# Patient Record
Sex: Male | Born: 1945
Health system: Southern US, Community
[De-identification: ages and names within clinical notes are randomized; demographics above are authoritative.]

## PROBLEM LIST (undated history)

## (undated) DIAGNOSIS — R011 Cardiac murmur, unspecified: Secondary | ICD-10-CM

## (undated) DIAGNOSIS — K529 Noninfective gastroenteritis and colitis, unspecified: Secondary | ICD-10-CM

## (undated) DIAGNOSIS — K829 Disease of gallbladder, unspecified: Secondary | ICD-10-CM

## (undated) DIAGNOSIS — I2510 Atherosclerotic heart disease of native coronary artery without angina pectoris: Secondary | ICD-10-CM

## (undated) DIAGNOSIS — R972 Elevated prostate specific antigen [PSA]: Secondary | ICD-10-CM

## (undated) DIAGNOSIS — T7840XA Allergy, unspecified, initial encounter: Secondary | ICD-10-CM

## (undated) DIAGNOSIS — I10 Essential (primary) hypertension: Secondary | ICD-10-CM

## (undated) DIAGNOSIS — M1990 Unspecified osteoarthritis, unspecified site: Secondary | ICD-10-CM

## (undated) DIAGNOSIS — E785 Hyperlipidemia, unspecified: Secondary | ICD-10-CM

## (undated) DIAGNOSIS — K219 Gastro-esophageal reflux disease without esophagitis: Secondary | ICD-10-CM

## (undated) DIAGNOSIS — K589 Irritable bowel syndrome without diarrhea: Secondary | ICD-10-CM

## (undated) HISTORY — PX: TONSILLECTOMY AND ADENOIDECTOMY: SUR1326

## (undated) HISTORY — PX: COLONOSCOPY: SHX174

## (undated) HISTORY — PX: CORONARY STENT PLACEMENT: SHX1402

## (undated) HISTORY — DX: Hyperlipidemia, unspecified: E78.5

## (undated) HISTORY — DX: Unspecified osteoarthritis, unspecified site: M19.90

## (undated) HISTORY — DX: Atherosclerotic heart disease of native coronary artery without angina pectoris: I25.10

## (undated) HISTORY — DX: Elevated prostate specific antigen (PSA): R97.20

## (undated) HISTORY — DX: Disease of gallbladder, unspecified: K82.9

## (undated) HISTORY — DX: Allergy, unspecified, initial encounter: T78.40XA

## (undated) HISTORY — DX: Gastro-esophageal reflux disease without esophagitis: K21.9

## (undated) HISTORY — DX: Cardiac murmur, unspecified: R01.1

## (undated) HISTORY — DX: Irritable bowel syndrome, unspecified: K58.9

## (undated) HISTORY — PX: CARDIAC CATHETERIZATION: SHX172

## (undated) HISTORY — DX: Essential (primary) hypertension: I10

---

## 2000-07-21 ENCOUNTER — Emergency Department (HOSPITAL_COMMUNITY): Admission: EM | Admit: 2000-07-21 | Discharge: 2000-07-21 | Payer: Self-pay | Admitting: Emergency Medicine

## 2000-12-18 ENCOUNTER — Inpatient Hospital Stay (HOSPITAL_COMMUNITY): Admission: EM | Admit: 2000-12-18 | Discharge: 2000-12-21 | Payer: Self-pay | Admitting: Emergency Medicine

## 2000-12-18 ENCOUNTER — Encounter: Payer: Self-pay | Admitting: Emergency Medicine

## 2001-01-09 ENCOUNTER — Encounter (HOSPITAL_COMMUNITY): Admission: RE | Admit: 2001-01-09 | Discharge: 2001-04-09 | Payer: Self-pay | Admitting: Cardiology

## 2004-08-10 ENCOUNTER — Ambulatory Visit: Payer: Self-pay | Admitting: Pulmonary Disease

## 2004-08-19 ENCOUNTER — Ambulatory Visit: Payer: Self-pay | Admitting: Pulmonary Disease

## 2004-09-20 ENCOUNTER — Ambulatory Visit: Payer: Self-pay | Admitting: Cardiology

## 2004-11-25 ENCOUNTER — Ambulatory Visit: Payer: Self-pay | Admitting: Pulmonary Disease

## 2005-01-12 ENCOUNTER — Ambulatory Visit: Payer: Self-pay | Admitting: Pulmonary Disease

## 2005-05-04 ENCOUNTER — Ambulatory Visit: Payer: Self-pay | Admitting: Pulmonary Disease

## 2005-05-09 ENCOUNTER — Ambulatory Visit: Payer: Self-pay | Admitting: Pulmonary Disease

## 2005-08-17 ENCOUNTER — Ambulatory Visit: Payer: Self-pay | Admitting: Pulmonary Disease

## 2005-11-07 ENCOUNTER — Ambulatory Visit: Payer: Self-pay | Admitting: Pulmonary Disease

## 2005-11-10 ENCOUNTER — Ambulatory Visit: Payer: Self-pay | Admitting: Pulmonary Disease

## 2006-05-09 ENCOUNTER — Ambulatory Visit: Payer: Self-pay | Admitting: Pulmonary Disease

## 2006-05-30 ENCOUNTER — Ambulatory Visit: Payer: Self-pay | Admitting: Gastroenterology

## 2006-06-12 ENCOUNTER — Ambulatory Visit: Payer: Self-pay | Admitting: Gastroenterology

## 2006-10-16 ENCOUNTER — Ambulatory Visit: Payer: Self-pay | Admitting: Pulmonary Disease

## 2007-01-01 ENCOUNTER — Ambulatory Visit: Payer: Self-pay | Admitting: Pulmonary Disease

## 2007-01-04 ENCOUNTER — Ambulatory Visit: Payer: Self-pay | Admitting: Pulmonary Disease

## 2007-01-04 LAB — CONVERTED CEMR LAB
ALT: 27 units/L (ref 0–40)
AST: 24 units/L (ref 0–37)
Albumin: 3.5 g/dL (ref 3.5–5.2)
Alkaline Phosphatase: 80 units/L (ref 39–117)
BUN: 15 mg/dL (ref 6–23)
Basophils Absolute: 0 10*3/uL (ref 0.0–0.1)
Basophils Relative: 0.5 % (ref 0.0–1.0)
Bilirubin, Direct: 0.1 mg/dL (ref 0.0–0.3)
CO2: 29 meq/L (ref 19–32)
Calcium: 9.6 mg/dL (ref 8.4–10.5)
Chloride: 107 meq/L (ref 96–112)
Cholesterol: 140 mg/dL (ref 0–200)
Creatinine, Ser: 1 mg/dL (ref 0.4–1.5)
Eosinophils Absolute: 0.2 10*3/uL (ref 0.0–0.6)
Eosinophils Relative: 2.7 % (ref 0.0–5.0)
GFR calc Af Amer: 98 mL/min
GFR calc non Af Amer: 81 mL/min
Glucose, Bld: 98 mg/dL (ref 70–99)
HCT: 44.7 % (ref 39.0–52.0)
HDL: 36.6 mg/dL — ABNORMAL LOW (ref >39.0)
Hemoglobin: 15.1 g/dL (ref 13.0–17.0)
LDL Cholesterol: 80 mg/dL (ref 0–99)
Lymphocytes Relative: 33 % (ref 12.0–46.0)
MCHC: 33.9 g/dL (ref 30.0–36.0)
MCV: 90.4 fL (ref 78.0–100.0)
Monocytes Absolute: 0.7 10*3/uL (ref 0.2–0.7)
Monocytes Relative: 9.4 % (ref 3.0–11.0)
Neutro Abs: 3.8 10*3/uL (ref 1.4–7.7)
Neutrophils Relative %: 54.4 % (ref 43.0–77.0)
PSA: 0.84 ng/mL (ref 0.10–4.00)
Platelets: 240 10*3/uL (ref 150–400)
Potassium: 4.4 meq/L (ref 3.5–5.1)
RBC: 4.94 M/uL (ref 4.22–5.81)
RDW: 12.7 % (ref 11.5–14.6)
Sodium: 140 meq/L (ref 135–145)
TSH: 2.42 microintl units/mL (ref 0.35–5.50)
Total Bilirubin: 1.1 mg/dL (ref 0.3–1.2)
Total CHOL/HDL Ratio: 3.8
Total Protein: 6.5 g/dL (ref 6.0–8.3)
Triglycerides: 117 mg/dL (ref 0–149)
VLDL: 23 mg/dL (ref 0–40)
WBC: 7 10*3/uL (ref 4.5–10.5)

## 2007-08-13 ENCOUNTER — Encounter: Payer: Self-pay | Admitting: Pulmonary Disease

## 2007-08-13 DIAGNOSIS — I10 Essential (primary) hypertension: Secondary | ICD-10-CM | POA: Insufficient documentation

## 2007-08-13 DIAGNOSIS — E785 Hyperlipidemia, unspecified: Secondary | ICD-10-CM | POA: Insufficient documentation

## 2007-08-13 DIAGNOSIS — K219 Gastro-esophageal reflux disease without esophagitis: Secondary | ICD-10-CM | POA: Insufficient documentation

## 2007-10-22 ENCOUNTER — Ambulatory Visit: Payer: Self-pay | Admitting: Pulmonary Disease

## 2007-10-22 ENCOUNTER — Encounter: Payer: Self-pay | Admitting: Adult Health

## 2007-10-22 LAB — CONVERTED CEMR LAB
Bilirubin Urine: NEGATIVE
Crystals: NEGATIVE
Ketones, ur: NEGATIVE mg/dL
Mucus, UA: NEGATIVE
Nitrite: NEGATIVE
Specific Gravity, Urine: 1.025 (ref 1.000–1.03)
Total Protein, Urine: 100 mg/dL — AB
Urine Glucose: NEGATIVE mg/dL
Urobilinogen, UA: 0.2 (ref 0.0–1.0)
pH: 6.5 (ref 5.0–8.0)

## 2007-11-05 ENCOUNTER — Encounter: Payer: Self-pay | Admitting: Adult Health

## 2007-11-05 ENCOUNTER — Ambulatory Visit: Payer: Self-pay | Admitting: Pulmonary Disease

## 2007-11-07 LAB — CONVERTED CEMR LAB
ALT: 38 units/L (ref 0–53)
AST: 32 units/L (ref 0–37)
Albumin: 4 g/dL (ref 3.5–5.2)
Alkaline Phosphatase: 94 units/L (ref 39–117)
BUN: 15 mg/dL (ref 6–23)
Bacteria, UA: NEGATIVE
Basophils Absolute: 0 10*3/uL (ref 0.0–0.1)
Basophils Relative: 0.6 % (ref 0.0–1.0)
Bilirubin Urine: NEGATIVE
Bilirubin, Direct: 0.1 mg/dL (ref 0.0–0.3)
CO2: 32 meq/L (ref 19–32)
Calcium: 9.6 mg/dL (ref 8.4–10.5)
Chloride: 103 meq/L (ref 96–112)
Cholesterol: 139 mg/dL (ref 0–200)
Creatinine, Ser: 1 mg/dL (ref 0.4–1.5)
Crystals: NEGATIVE
Eosinophils Absolute: 0.1 10*3/uL (ref 0.0–0.7)
Eosinophils Relative: 1.7 % (ref 0.0–5.0)
GFR calc Af Amer: 98 mL/min
GFR calc non Af Amer: 81 mL/min
Glucose, Bld: 103 mg/dL — ABNORMAL HIGH (ref 70–99)
HCT: 46.2 % (ref 39.0–52.0)
HDL: 33.1 mg/dL — ABNORMAL LOW (ref >39.0)
Hemoglobin, Urine: NEGATIVE
Hemoglobin: 15.1 g/dL (ref 13.0–17.0)
Ketones, ur: NEGATIVE mg/dL
LDL Cholesterol: 71 mg/dL (ref 0–99)
Leukocytes, UA: NEGATIVE
Lymphocytes Relative: 29 % (ref 12.0–46.0)
MCHC: 32.7 g/dL (ref 30.0–36.0)
MCV: 90.5 fL (ref 78.0–100.0)
Monocytes Absolute: 0.6 10*3/uL (ref 0.1–1.0)
Monocytes Relative: 8.8 % (ref 3.0–12.0)
Mucus, UA: NEGATIVE
Neutro Abs: 3.9 10*3/uL (ref 1.4–7.7)
Neutrophils Relative %: 59.9 % (ref 43.0–77.0)
Nitrite: NEGATIVE
PSA: 5 ng/mL — ABNORMAL HIGH (ref 0.10–4.00)
Platelets: 216 10*3/uL (ref 150–400)
Potassium: 4.7 meq/L (ref 3.5–5.1)
RBC / HPF: NONE SEEN
RBC: 5.1 M/uL (ref 4.22–5.81)
RDW: 12.4 % (ref 11.5–14.6)
Sed Rate: 12 mm/hr (ref 0–16)
Sodium: 141 meq/L (ref 135–145)
Specific Gravity, Urine: 1.015 (ref 1.000–1.03)
TSH: 1.5 microintl units/mL (ref 0.35–5.50)
Total Bilirubin: 0.8 mg/dL (ref 0.3–1.2)
Total CHOL/HDL Ratio: 4.2
Total Protein: 7.5 g/dL (ref 6.0–8.3)
Triglycerides: 174 mg/dL — ABNORMAL HIGH (ref 0–149)
Urine Glucose: NEGATIVE mg/dL
Urobilinogen, UA: 0.2 (ref 0.0–1.0)
VLDL: 35 mg/dL (ref 0–40)
WBC: 6.5 10*3/uL (ref 4.5–10.5)
pH: 7 (ref 5.0–8.0)

## 2007-11-27 ENCOUNTER — Ambulatory Visit: Payer: Self-pay | Admitting: Pulmonary Disease

## 2007-11-27 DIAGNOSIS — H9190 Unspecified hearing loss, unspecified ear: Secondary | ICD-10-CM | POA: Insufficient documentation

## 2007-11-27 DIAGNOSIS — K589 Irritable bowel syndrome without diarrhea: Secondary | ICD-10-CM | POA: Insufficient documentation

## 2007-11-27 DIAGNOSIS — K829 Disease of gallbladder, unspecified: Secondary | ICD-10-CM | POA: Insufficient documentation

## 2007-11-27 LAB — CONVERTED CEMR LAB: PSA: 2.7 ng/mL (ref 0.10–4.00)

## 2007-11-30 ENCOUNTER — Telehealth (INDEPENDENT_AMBULATORY_CARE_PROVIDER_SITE_OTHER): Payer: Self-pay | Admitting: *Deleted

## 2008-04-09 ENCOUNTER — Encounter: Payer: Self-pay | Admitting: Pulmonary Disease

## 2008-05-06 ENCOUNTER — Ambulatory Visit: Payer: Self-pay | Admitting: Pulmonary Disease

## 2008-05-07 ENCOUNTER — Ambulatory Visit: Payer: Self-pay | Admitting: Pulmonary Disease

## 2008-05-07 LAB — CONVERTED CEMR LAB
ALT: 24 units/L (ref 0–53)
AST: 30 units/L (ref 0–37)
Albumin: 3.9 g/dL (ref 3.5–5.2)
Alkaline Phosphatase: 80 units/L (ref 39–117)
Bilirubin, Direct: 0.3 mg/dL (ref 0.0–0.3)
Cholesterol: 102 mg/dL (ref 0–200)
HDL: 39.7 mg/dL (ref >39.0)
LDL Cholesterol: 48 mg/dL (ref 0–99)
Total Bilirubin: 1.5 mg/dL — ABNORMAL HIGH (ref 0.3–1.2)
Total CHOL/HDL Ratio: 2.6
Total Protein: 6.9 g/dL (ref 6.0–8.3)
Triglycerides: 70 mg/dL (ref 0–149)
VLDL: 14 mg/dL (ref 0–40)

## 2009-04-09 ENCOUNTER — Ambulatory Visit: Payer: Self-pay | Admitting: Pulmonary Disease

## 2009-04-09 LAB — CONVERTED CEMR LAB
ALT: 34 units/L (ref 0–53)
AST: 27 units/L (ref 0–37)
Albumin: 4.1 g/dL (ref 3.5–5.2)
Alkaline Phosphatase: 90 units/L (ref 39–117)
BUN: 19 mg/dL (ref 6–23)
Basophils Absolute: 0.1 10*3/uL (ref 0.0–0.1)
Basophils Relative: 1.7 % (ref 0.0–3.0)
Bilirubin Urine: NEGATIVE
Bilirubin, Direct: 0.1 mg/dL (ref 0.0–0.3)
CO2: 31 meq/L (ref 19–32)
Calcium: 9.4 mg/dL (ref 8.4–10.5)
Chloride: 106 meq/L (ref 96–112)
Cholesterol: 143 mg/dL (ref 0–200)
Creatinine, Ser: 1 mg/dL (ref 0.4–1.5)
Eosinophils Absolute: 0.2 10*3/uL (ref 0.0–0.7)
Eosinophils Relative: 2.2 % (ref 0.0–5.0)
GFR calc non Af Amer: 80.61 mL/min (ref >60)
Glucose, Bld: 92 mg/dL (ref 70–99)
HCT: 44.8 % (ref 39.0–52.0)
HDL: 36.2 mg/dL — ABNORMAL LOW (ref >39.00)
Hemoglobin, Urine: NEGATIVE
Hemoglobin: 15.3 g/dL (ref 13.0–17.0)
Ketones, ur: NEGATIVE mg/dL
LDL Cholesterol: 84 mg/dL (ref 0–99)
Leukocytes, UA: NEGATIVE
Lymphocytes Relative: 32.7 % (ref 12.0–46.0)
Lymphs Abs: 2.5 10*3/uL (ref 0.7–4.0)
MCHC: 34.1 g/dL (ref 30.0–36.0)
MCV: 90.7 fL (ref 78.0–100.0)
Monocytes Absolute: 0.6 10*3/uL (ref 0.1–1.0)
Monocytes Relative: 8.3 % (ref 3.0–12.0)
Neutro Abs: 4.1 10*3/uL (ref 1.4–7.7)
Neutrophils Relative %: 55.1 % (ref 43.0–77.0)
Nitrite: NEGATIVE
PSA: 1.02 ng/mL (ref 0.10–4.00)
Platelets: 217 10*3/uL (ref 150.0–400.0)
Potassium: 4.9 meq/L (ref 3.5–5.1)
RBC: 4.94 M/uL (ref 4.22–5.81)
RDW: 12 % (ref 11.5–14.6)
Sodium: 141 meq/L (ref 135–145)
Specific Gravity, Urine: 1.01 (ref 1.000–1.030)
TSH: 1.41 microintl units/mL (ref 0.35–5.50)
Total Bilirubin: 1.2 mg/dL (ref 0.3–1.2)
Total CHOL/HDL Ratio: 4
Total Protein, Urine: NEGATIVE mg/dL
Total Protein: 7.7 g/dL (ref 6.0–8.3)
Triglycerides: 112 mg/dL (ref 0.0–149.0)
Urine Glucose: NEGATIVE mg/dL
Urobilinogen, UA: 0.2 (ref 0.0–1.0)
VLDL: 22.4 mg/dL (ref 0.0–40.0)
WBC: 7.5 10*3/uL (ref 4.5–10.5)
pH: 7.5 (ref 5.0–8.0)

## 2009-05-01 ENCOUNTER — Telehealth: Payer: Self-pay | Admitting: Adult Health

## 2009-05-12 ENCOUNTER — Ambulatory Visit: Payer: Self-pay | Admitting: Pulmonary Disease

## 2009-07-30 ENCOUNTER — Ambulatory Visit: Payer: Self-pay | Admitting: Pulmonary Disease

## 2009-07-30 DIAGNOSIS — R972 Elevated prostate specific antigen [PSA]: Secondary | ICD-10-CM | POA: Insufficient documentation

## 2009-07-30 DIAGNOSIS — M1990 Unspecified osteoarthritis, unspecified site: Secondary | ICD-10-CM | POA: Insufficient documentation

## 2009-11-10 ENCOUNTER — Ambulatory Visit: Payer: Self-pay | Admitting: Pulmonary Disease

## 2009-11-10 DIAGNOSIS — J40 Bronchitis, not specified as acute or chronic: Secondary | ICD-10-CM | POA: Insufficient documentation

## 2010-05-02 ENCOUNTER — Encounter: Payer: Self-pay | Admitting: Pulmonary Disease

## 2010-08-24 NOTE — Assessment & Plan Note (Signed)
Summary: rov ///kp   CC:  15 month ROV & review mult medical problems....  History of Present Illness: 65 y/o WM here for a follow up visit... he has HBP, CAD w/ prev stent, Hyperchol... stable on meds- no new complaints or concerns...    ~  he was seen 4/09 w/ UTI and his PSA was elevated likely due to tthe infection, treated w/ Cipro... f/u PSA 5/09 was improved at 2.70...  ~  labs 4/09 looked good x for incr TG's... he's really done well on diet + exerc program x 5-6 months now & lost 11# to 196... f/u FLP improved!  ~  Oct09: feeling well w/o new complaints or concerns...   ~  July 30, 2009:  he saw TP 10/10 & had meds adjusted- Metop decr to 25mg Bid & Altace incr to 10mg /d... BP's OK on these meds but weight up 15# & he is committed to diet + exercise program... denies angina, SOB, edema, etc... Chol looks good on Lip20... he had his Flu shot in Oct10...    Current Problem List:  HEARING LOSS, MILD (ICD-389.9) - eval by DrKraus in 2005...  HYPERTENSION (ICD-401.9) - on Rx w/ METOPROLOL 50mg - 1/2tab Bid, ALTACE 10mg /d... BP= 140/80 today and similar at home... weight up 15# to 211# as noted... denies HA, fatigue, visual changes, CP, palipit, dizziness, syncope, dyspnea, edema, etc... we discussed diet + exercise program.  CORONARY ARTERY DISEASE (ICD-414.00) - on above + ASA 81mg /d... he has never had to take any of his NTG... he had a nonQ-MI 5/02 with cath showing 95% stenosis of mid LAD- s/p PTCA/ stent... otherw he had some non-obstructive dis... denies CP, palpit, SOB, dizzy, edema, etc...  ~  last NuclearStressTest 3/04 showed no ischemia or infarct & EF=59%...  HYPERLIPIDEMIA (ICD-272.4) - on LIPITOR 20mg /d, +Fish Oil 1000mg Bid...  ~  FLP 4/09 showed TChol 139, TG 174, HDL 33, LDL 71... rec same meds + better diet/ get wt down.  ~  FLP 10/09 showed TChol 102, TG 70, HDL 40, LDL 48... GREAT! same Rx...  ~  FLP 9/10 showed TChol 143, TG 112, HDL 36, LDL 84  GERD (ICD-530.81)  - on NEXIUM 40mg /d... hx PUD w/ DU 1997...  ~  last EGD 12/97 by DrKaplan showed mult post-bulbar ulcers...   IRRITABLE BOWEL SYNDROME (ICD-564.1) -prev on Levsin as needed...  ~  last colonoscopy 11/07 was WNL.Marland Kitchen. repeat planned 68yrs...  Hx of GALLBLADDER DISEASE (ICD-575.9) - GB sludge on sonar 1997... s/p ERCP by DrKaplan which was neg x for post bulbar ulcers seen...   Hx of PSA, INCREASED (ICD-790.93) - PSA was 5.00 in 2009 assoc w/ mild prostatitis infection... treated w/ antibiotics and repeat PSA= 2.70 in May09...  ~  last labs 9/10 showed PSA= 1.02  DEGENERATIVE JOINT DISEASE (ICD-715.90) - c/o mild DJD in hands etc... OTC Rx Prn.    Allergies (verified): No Known Drug Allergies  Comments:  Nurse/Medical Assistant: The patient's medications and allergies were reviewed with the patient and were updated in the Medication and Allergy Lists.  Past History:  Past Medical History:  HEARING LOSS, MILD (ICD-389.9) HYPERTENSION (ICD-401.9) CORONARY ARTERY DISEASE (ICD-414.00) HYPERLIPIDEMIA (ICD-272.4) GERD (ICD-530.81) IRRITABLE BOWEL SYNDROME (ICD-564.1) Hx of GALLBLADDER DISEASE (ICD-575.9) Hx of PSA, INCREASED (ICD-790.93) DEGENERATIVE JOINT DISEASE (ICD-715.90)  Family History: Reviewed history from 04/09/2009 and no changes required. allergies - mother rheumatism - maternal aunt  Social History: Reviewed history from 04/09/2009 and no changes required. never smoked rare alcohol occasional caffeine  married 2 children works in Public relations account executive  Review of Systems      See HPI  The patient denies anorexia, fever, weight loss, weight gain, vision loss, decreased hearing, hoarseness, chest pain, syncope, dyspnea on exertion, peripheral edema, prolonged cough, headaches, hemoptysis, abdominal pain, melena, hematochezia, severe indigestion/heartburn, hematuria, incontinence, muscle weakness, suspicious skin lesions, transient blindness, difficulty walking, depression,  unusual weight change, abnormal bleeding, enlarged lymph nodes, and angioedema.    Vital Signs:  Patient profile:   65 year old male Height:      69 inches Weight:      210.50 pounds BMI:     31.20 O2 Sat:      95 % on Room air Temp:     97.1 degrees F oral Pulse rate:   65 / minute BP sitting:   140 / 80  (right arm) Cuff size:   regular  Vitals Entered By: Randell Loop CMA (July 30, 2009 10:36 AM)  O2 Sat at Rest %:  95 O2 Flow:  Room air CC: 15 month ROV & review mult medical problems... Comments no changes in meds   Physical Exam  Additional Exam:  WD, WN, 63 WM in NAD... GENERAL:  Alert & oriented; pleasant & cooperative. HEENT:  [place]/AT, EOM-wnl, PERRLA, Fundi-benign, EACs-clear, TMs-wnl, NOSE-clear, THROAT-clear & wnl. NECK:  Supple w/ full ROM; no JVD; normal carotid impulses w/o bruits; no thyromegaly or nodules palpated; no lymphadenopathy. CHEST:  Clear to P & A; without wheezes/ rales/ or rhonchi. HEART:  Regular Rhythm; without murmurs/ rubs/ or gallops. ABDOMEN:  Soft & nontender; normal bowel sounds; no organomegaly or masses detected. EXT: without deformities or arthritic changes; no varicose veins/ venous insuffic/ or edema. NEURO:  CN's intact;  no focal neuro deficits... DERM:  No lesions noted; no rash etc...     Impression & Recommendations:  Problem # 1:  HYPERTENSION (ICD-401.9) Controlled-  same meds. His updated medication list for this problem includes:    Metoprolol Tartrate 50 Mg Tabs (Metoprolol tartrate) .Marland Kitchen... Take 1/2 tablet by mouth two times a day    Altace 10 Mg Caps (Ramipril) .Marland Kitchen... 1 by mouth once daily  Problem # 2:  CORONARY ARTERY DISEASE (ICD-414.00) Stable-  no angina. His updated medication list for this problem includes:    Bayer Aspirin Ec Low Dose 81 Mg Tbec (Aspirin) .Marland Kitchen... Take 1 tablet by mouth once a day    Metoprolol Tartrate 50 Mg Tabs (Metoprolol tartrate) .Marland Kitchen... Take 1/2 tablet by mouth two times a day    Altace 10  Mg Caps (Ramipril) .Marland Kitchen... 1 by mouth once daily    Nitrostat 0.4 Mg Subl (Nitroglycerin) .Marland Kitchen... Place 1 tab under stongue every 4 minutes until chest pain is gone up to 3 doses. no relief - get help.  Problem # 3:  HYPERLIPIDEMIA (ICD-272.4) Controlled-  continue Lip20. His updated medication list for this problem includes:    Lipitor 20 Mg Tabs (Atorvastatin calcium) .Marland Kitchen... Take 1 tablet by mouth once a day  Problem # 4:  GERD (ICD-530.81) GI is stable-  continue Rx. His updated medication list for this problem includes:    Nexium 40 Mg Cpdr (Esomeprazole magnesium) .Marland Kitchen... Take 1 by mouth once daily  Problem # 5:  OTHER MEDICAL PROBLEMS AS NOTED>>>  Complete Medication List: 1)  Bayer Aspirin Ec Low Dose 81 Mg Tbec (Aspirin) .... Take 1 tablet by mouth once a day 2)  Metoprolol Tartrate 50 Mg Tabs (Metoprolol tartrate) .... Take 1/2 tablet by  mouth two times a day 3)  Altace 10 Mg Caps (Ramipril) .Marland Kitchen.. 1 by mouth once daily 4)  Lipitor 20 Mg Tabs (Atorvastatin calcium) .... Take 1 tablet by mouth once a day 5)  Nexium 40 Mg Cpdr (Esomeprazole magnesium) .... Take 1 by mouth once daily 6)  Multivitamins Tabs (Multiple vitamin) .... Take 1 tablet by mouth once a day 7)  Mega Red  .... Take 1 capsule by mouth once a day 8)  Nitrostat 0.4 Mg Subl (Nitroglycerin) .... Place 1 tab under stongue every 4 minutes until chest pain is gone up to 3 doses. no relief - get help.  Other Orders: Prescription Created Electronically [id number])  Patient Instructions: 1)  Today we updated your med list- see below.... 2)  Continue your current meds the same... 3)  Let's get on track w/ our diet + exercise therapy>> the goal is to get the weight back down! 4)  Call for any problems... Prescriptions: LIPITOR 20 MG  TABS (ATORVASTATIN CALCIUM) Take 1 tablet by mouth once a day  #30 x prn   Entered and Authorized by:   Michele Mcalpine MD   Signed by:   Michele Mcalpine MD on 07/30/2009   Method used:   Print then  Give to Patient   RxID:   [id number]

## 2010-08-24 NOTE — Miscellaneous (Signed)
Summary: Flu Vaccine / [facility]  Flu Vaccine / [facility]   Imported By: Lennie Odor 05/13/2010 14:00:30  _____________________________________________________________________  External Attachment:    Type:   Image     Comment:   External Document

## 2010-08-24 NOTE — Assessment & Plan Note (Signed)
Summary: Acute NP office visit - bronchitis   CC:  increased SOB, wheezing, cough occ producing yellow mucus, sore throat, tightness in chest, and f/c/s x4days.  History of Present Illness: 41  WM pt of  Dr. Kriste Basque, who has a known history of hypertension,arteriosclerotic heart disease, and hyperlipidemia    April 09, 2009--Here for follow up and med refills. Doing well, no complaints. Feeling good, active, no change in activity tolerance. no chest pain. Trying to eat healthy.    05/12/09--Presents for follow up  BP. Last visit pulse was noted to be in upper 40 to low 50s . We discussed change in maeds w/ decrease metoprolol and increase altace. He has been checking b/p several times a day and esp around when he exercises. b/p w/ treadmill   ~160 then  and w/ rest returns to 120s , hr does go up w/ exercise in low 100s , resting in 50s/low 60s. Denies chest pain, dyspnea, orthopnea, hemoptysis, fever, n/v/d, edema, headache, palpitations.  He had previously talked about low energy, under alot of stress.   November 10, 2009 --Presents for an acute office visit. Complains of increased SOB, wheezing, cough occ producing yellow mucus, sore throat, tightness in chest, f/c/s x4days. OTC not helping  Feels worse today w/ low grade fever Just returned from beach. Denies chest pain, dyspnea, orthopnea, hemoptysis, fever, n/v/d, edema, headache.   Medications Prior to Update: 1)  Bayer Aspirin Ec Low Dose 81 Mg  Tbec (Aspirin) .... Take 1 Tablet By Mouth Once A Day 2)  Metoprolol Tartrate 50 Mg  Tabs (Metoprolol Tartrate) .... Take 1/2 Tablet By Mouth Two Times A Day 3)  Altace 10 Mg Caps (Ramipril) .Marland Kitchen.. 1 By Mouth Once Daily 4)  Lipitor 20 Mg  Tabs (Atorvastatin Calcium) .... Take 1 Tablet By Mouth Once A Day 5)  Nexium 40 Mg  Cpdr (Esomeprazole Magnesium) .... Take 1 By Mouth Once Daily 6)  Multivitamins   Tabs (Multiple Vitamin) .... Take 1 Tablet By Mouth Once A Day 7)  Mega Red .... Take 1 Capsule By  Mouth Once A Day 8)  Nitrostat 0.4 Mg Subl (Nitroglycerin) .... Place 1 Tab Under Stongue Every 4 Minutes Until Chest Pain Is Gone Up To 3 Doses. No Relief - Get Help.  Current Medications (verified): 1)  Bayer Aspirin Ec Low Dose 81 Mg  Tbec (Aspirin) .... Take 1 Tablet By Mouth Once A Day 2)  Metoprolol Tartrate 50 Mg  Tabs (Metoprolol Tartrate) .... Take 1/2 Tablet By Mouth Two Times A Day 3)  Altace 10 Mg Caps (Ramipril) .Marland Kitchen.. 1 By Mouth Once Daily 4)  Lipitor 20 Mg  Tabs (Atorvastatin Calcium) .... Take 1 Tablet By Mouth Once A Day 5)  Nexium 40 Mg  Cpdr (Esomeprazole Magnesium) .... Take 1 By Mouth Once Daily 6)  Multivitamins   Tabs (Multiple Vitamin) .... Take 1 Tablet By Mouth Once A Day 7)  Mega Red .... Take 1 Capsule By Mouth Once A Day 8)  Nitrostat 0.4 Mg Subl (Nitroglycerin) .... Place 1 Tab Under Stongue Every 4 Minutes Until Chest Pain Is Gone Up To 3 Doses. No Relief - Get Help. 9)  Mucinex Dm 30-600 Mg Xr12h-Tab (Dextromethorphan-Guaifenesin) .... Take 1-2 Tablets Every 12 Hours As Needed  Allergies (verified): No Known Drug Allergies  Past History:  Past Medical History: Last updated: 07/30/2009  HEARING LOSS, MILD (ICD-389.9) HYPERTENSION (ICD-401.9) CORONARY ARTERY DISEASE (ICD-414.00) HYPERLIPIDEMIA (ICD-272.4) GERD (ICD-530.81) IRRITABLE BOWEL SYNDROME (ICD-564.1) Hx of GALLBLADDER DISEASE (  ICD-575.9) Hx of PSA, INCREASED (ICD-790.93) DEGENERATIVE JOINT DISEASE (ICD-715.90)  Family History: Last updated: 04/09/2009 allergies - mother rheumatism - maternal aunt  Social History: Last updated: 04/09/2009 never smoked rare alcohol occasional caffeine married 2 children works in Public relations account executive  Risk Factors: Smoking Status: never (05/07/2008)  Review of Systems      See HPI  Vital Signs:  Patient profile:   65 year old male Height:      69 inches Weight:      206 pounds BMI:     30.53 O2 Sat:      97 % on Room air Temp:     100.2 degrees F  oral Pulse rate:   67 / minute BP sitting:   124 / 74  (left arm) Cuff size:   regular  Vitals Entered By: Boone Master CNA (November 10, 2009 4:06 PM)  O2 Flow:  Room air CC: increased SOB, wheezing, cough occ producing yellow mucus, sore throat, tightness in chest, f/c/s x4days Is Patient Diabetic? No Comments Medications reviewed with patient Daytime contact number verified with patient. Boone Master CNA  November 10, 2009 4:03 PM    Physical Exam  Additional Exam:  WD, WN, 64  WM in NAD... GENERAL:  Alert & oriented; pleasant & cooperative. HEENT:  [place]/AT, EACs-clear, TMs-wnl, NOSE-clear, THROAT-clear & wnl. NECK:  Supple w/ full ROM; no JVD; normal carotid impulses w/o bruits; no thyromegaly or nodules palpated; no lymphadenopathy. CHEST:  Coarse BS w/ no wheezing  HEART:  Regular Rhythm; without murmurs/ rubs/ or gallops. ABDOMEN:  Soft & nontender; normal bowel sounds; no organomegaly or masses detected. EXT: without deformities or arthritic changes; no varicose veins/ venous insuffic/ or edema. NEURO:  CN's intact;  no focal neuro deficits...     Impression & Recommendations:  Problem # 1:  BRONCHITIS (ICD-490)  Flare  REC:  Zpack  take as directed.  Mucinex DM two times a day as needed cough/congestion Increase fluids.  Hydromet 1-2 tsp every 4-6 hr as needed cough.  Please contact office for sooner follow up if symptoms do not improve or worsen  His updated medication list for this problem includes:    Mucinex Dm 30-600 Mg Xr12h-tab (Dextromethorphan-guaifenesin) .Marland Kitchen... Take 1-2 tablets every 12 hours as needed    Zithromax Tri-pak 500 Mg Tabs (Azithromycin) .Marland Kitchen... Take as directed.    Hydromet 5-1.5 Mg/23ml Syrp (Hydrocodone-homatropine) .Marland Kitchen... 1-2 tsp every 4-6 hr as needed cough  Orders: Nebulizer Tx (16109) Est. Patient Level IV (60454)  Medications Added to Medication List This Visit: 1)  Mucinex Dm 30-600 Mg Xr12h-tab (Dextromethorphan-guaifenesin) .... Take  1-2 tablets every 12 hours as needed 2)  Zithromax Tri-pak 500 Mg Tabs (Azithromycin) .... Take as directed. 3)  Hydromet 5-1.5 Mg/61ml Syrp (Hydrocodone-homatropine) .Marland Kitchen.. 1-2 tsp every 4-6 hr as needed cough  Complete Medication List: 1)  Bayer Aspirin Ec Low Dose 81 Mg Tbec (Aspirin) .... Take 1 tablet by mouth once a day 2)  Metoprolol Tartrate 50 Mg Tabs (Metoprolol tartrate) .... Take 1/2 tablet by mouth two times a day 3)  Altace 10 Mg Caps (Ramipril) .Marland Kitchen.. 1 by mouth once daily 4)  Lipitor 20 Mg Tabs (Atorvastatin calcium) .... Take 1 tablet by mouth once a day 5)  Nexium 40 Mg Cpdr (Esomeprazole magnesium) .... Take 1 by mouth once daily 6)  Multivitamins Tabs (Multiple vitamin) .... Take 1 tablet by mouth once a day 7)  Mega Red  .... Take 1 capsule by mouth once a day 8)  Nitrostat 0.4 Mg Subl (Nitroglycerin) .... Place 1 tab under stongue every 4 minutes until chest pain is gone up to 3 doses. no relief - get help. 9)  Mucinex Dm 30-600 Mg Xr12h-tab (Dextromethorphan-guaifenesin) .... Take 1-2 tablets every 12 hours as needed 10)  Zithromax Tri-pak 500 Mg Tabs (Azithromycin) .... Take as directed. 11)  Hydromet 5-1.5 Mg/67ml Syrp (Hydrocodone-homatropine) .Marland Kitchen.. 1-2 tsp every 4-6 hr as needed cough  Patient Instructions: 1)  Zpack  take as directed.  2)  Mucinex DM two times a day as needed cough/congestion 3)  Increase fluids.  4)  Hydromet 1-2 tsp every 4-6 hr as needed cough.  5)  Please contact office for sooner follow up if symptoms do not improve or worsen  Prescriptions: HYDROMET 5-1.5 MG/5ML SYRP (HYDROCODONE-HOMATROPINE) 1-2 tsp every 4-6 hr as needed cough  #8 oz x 0   Entered and Authorized by:   Rubye Oaks NP   Signed by:   Tammy Parrett NP on 11/10/2009   Method used:   Print then Give to Patient   RxID:   [id number] ZITHROMAX TRI-PAK 500 MG TABS (AZITHROMYCIN) take as directed.  #1 x 0   Entered and Authorized by:   Rubye Oaks NP   Signed by:   Tammy  Parrett NP on 11/10/2009   Method used:   Electronically to        [facility]  [address] [facility] [address] [id number] ([facility])       [address]       [place], [place]  [address]       Ph: [phone]       Fax: [phone]   RxID:   [id number]    Immunization History:  Influenza Immunization History:    Influenza:  historical (03/25/2009)

## 2010-09-02 ENCOUNTER — Encounter: Payer: Self-pay | Admitting: Pulmonary Disease

## 2010-09-02 ENCOUNTER — Ambulatory Visit (INDEPENDENT_AMBULATORY_CARE_PROVIDER_SITE_OTHER): Payer: BC Managed Care – PPO | Admitting: Pulmonary Disease

## 2010-09-02 ENCOUNTER — Other Ambulatory Visit: Payer: Self-pay | Admitting: Pulmonary Disease

## 2010-09-02 ENCOUNTER — Ambulatory Visit (INDEPENDENT_AMBULATORY_CARE_PROVIDER_SITE_OTHER)
Admission: RE | Admit: 2010-09-02 | Discharge: 2010-09-02 | Disposition: A | Discharge status: Home / Self Care | Payer: BC Managed Care – PPO | Source: Ambulatory Visit | Attending: Pulmonary Disease | Admitting: Pulmonary Disease

## 2010-09-02 ENCOUNTER — Other Ambulatory Visit: Payer: BC Managed Care – PPO

## 2010-09-02 DIAGNOSIS — Z0000 Encounter for general adult medical examination without abnormal findings: Secondary | ICD-10-CM

## 2010-09-02 DIAGNOSIS — Z23 Encounter for immunization: Secondary | ICD-10-CM

## 2010-09-02 DIAGNOSIS — E785 Hyperlipidemia, unspecified: Secondary | ICD-10-CM

## 2010-09-02 LAB — URINALYSIS, ROUTINE W REFLEX MICROSCOPIC
Hgb urine dipstick: NEGATIVE
Ketones, ur: NEGATIVE
Total Protein, Urine: NEGATIVE
Urine Glucose: NEGATIVE
Urobilinogen, UA: 0.2 (ref 0.0–1.0)

## 2010-09-02 LAB — HEPATIC FUNCTION PANEL
AST: 28 U/L (ref 0–37)
Bilirubin, Direct: 0.1 mg/dL (ref 0.0–0.3)
Total Bilirubin: 1 mg/dL (ref 0.3–1.2)

## 2010-09-02 LAB — CBC WITH DIFFERENTIAL/PLATELET
Basophils Relative: 0.4 % (ref 0.0–3.0)
Eosinophils Relative: 2.3 % (ref 0.0–5.0)
HCT: 45.3 % (ref 39.0–52.0)
Lymphs Abs: 2.3 10*3/uL (ref 0.7–4.0)
MCHC: 34.2 g/dL (ref 30.0–36.0)
MCV: 91.3 fl (ref 78.0–100.0)
Monocytes Absolute: 0.7 10*3/uL (ref 0.1–1.0)
RBC: 4.96 Mil/uL (ref 4.22–5.81)
WBC: 8.5 10*3/uL (ref 4.5–10.5)

## 2010-09-02 LAB — LIPID PANEL: Total CHOL/HDL Ratio: 4

## 2010-09-02 LAB — BASIC METABOLIC PANEL
BUN: 16 mg/dL (ref 6–23)
Creatinine, Ser: 1 mg/dL (ref 0.4–1.5)
GFR: 82.14 mL/min (ref >60.00)
Potassium: 5.3 mEq/L — ABNORMAL HIGH (ref 3.5–5.1)

## 2010-09-02 LAB — LDL CHOLESTEROL, DIRECT: Direct LDL: 76.5 mg/dL

## 2010-09-02 LAB — TSH: TSH: 1.7 u[IU]/mL (ref 0.35–5.50)

## 2010-09-15 NOTE — Assessment & Plan Note (Signed)
Summary: cpx/fasting//apc   CC:  Yearly ROV & CPX....  History of Present Illness: 65 y/o WM here for a follow up visit... he has HBP, CAD w/ prev stent, Hyperchol... stable on meds- no new complaints or concerns...    ~  July 30, 2009:  he saw TP 10/10 & had meds adjusted- Metop decr to 25mg Bid & Altace incr to 10mg /d... BP's OK on these meds but weight up 15# & he is committed to diet + exercise program... denies angina, SOB, edema, etc... Chol looks good on Lip20... he had his Flu shot in Oct10...   ~  September 11, 2010:  here for CPX> doing well w/o new complaints or concerns... unfortunately wt is up further & we discussed diet & exercise program... denies CP, palpit, SOB, edema, etc...  BP controlled on BBLocker/ ACE;  Chol looks good on Statin but incr TG noted w/ incr wt & we discussed this;  GERD controlled w/ PPI;  PSA remains normal;  he had Flu shot last fall, OK TDAP today...    Current Problem List:  HEARING LOSS, MILD (ICD-389.9) - eval by DrKraus in 2005...  HYPERTENSION (ICD-401.9) - on Rx w/ METOPROLOL 50mg - 1/2tab Bid, ALTACE 10mg /d... BP= 132/80 today and similar at home... weight up to 212# as noted... denies HA, fatigue, visual changes, CP, palipit, dizziness, syncope, dyspnea, edema, etc... we discussed diet + exercise program.  CORONARY ARTERY DISEASE (ICD-414.00) - on above + ASA 81mg /d... he has never had to take any of his NTG... he had a nonQ-MI 5/02 with cath showing 95% stenosis of mid LAD- s/p PTCA/ stent... otherw he had some non-obstructive dis... denies CP, palpit, SOB, dizzy, edema, etc...  ~  last NuclearStressTest 3/04 showed no ischemia or infarct & EF=59%...  HYPERLIPIDEMIA (ICD-272.4) - on LIPITOR 20mg /d, +Fish Oil 1000mg Bid...  ~  FLP 4/09 showed TChol 139, TG 174, HDL 33, LDL 71... rec same meds + better diet/ get wt down.  ~  FLP 10/09 showed TChol 102, TG 70, HDL 40, LDL 48... GREAT! same Rx...  ~  FLP 9/10 showed TChol 143, TG 112, HDL 36, LDL  84  ~  FLP 2/12 showed TChol 150, TG 225, HDL 36, LDL 77... rising TG w/ incr wt> needs better diet.  GERD (ICD-530.81) - on NEXIUM 40mg /d... hx PUD w/ DU 1997...  ~  last EGD 12/97 by DrKaplan showed mult post-bulbar ulcers...   IRRITABLE BOWEL SYNDROME (ICD-564.1) -prev on Levsin as needed...  ~  last colonoscopy 11/07 was WNL.Marland Kitchen. repeat planned 31yrs...  Hx of GALLBLADDER DISEASE (ICD-575.9) - GB sludge on sonar 1997... s/p ERCP by DrKaplan which was neg x for post bulbar ulcers seen...   Hx of PSA, INCREASED (ICD-790.93) - PSA was 5.00 in 2009 assoc w/ mild prostatitis infection... treated w/ antibiotics and repeat PSA= 2.70 in May09...  ~  last labs 9/10 showed PSA= 1.02  ~  labs 2/12 showed PSA= 1.25  DEGENERATIVE JOINT DISEASE (ICD-715.90) - c/o mild DJD in hands etc... OTC Rx Prn.   Preventive Screening-Counseling & Management  Alcohol-Tobacco     Smoking Status: never  Allergies (verified): No Known Drug Allergies  Past History:  Past Medical History: HEARING LOSS, MILD (ICD-389.9) HYPERTENSION (ICD-401.9) CORONARY ARTERY DISEASE (ICD-414.00) HYPERLIPIDEMIA (ICD-272.4) GERD (ICD-530.81) IRRITABLE BOWEL SYNDROME (ICD-564.1) Hx of GALLBLADDER DISEASE (ICD-575.9) Hx of PSA, INCREASED (ICD-790.93) DEGENERATIVE JOINT DISEASE (ICD-715.90)  Family History: Reviewed history from 04/09/2009 and no changes required. allergies - mother rheumatism - maternal aunt  Social History: Reviewed history from 04/09/2009 and no changes required. never smoked rare alcohol occasional caffeine married 2 children works in Public relations account executive  Review of Systems       The patient complains of gas/bloating, joint pain, and muscle cramps.  The patient denies fever, chills, sweats, anorexia, fatigue, weakness, malaise, weight loss, sleep disorder, blurring, diplopia, eye irritation, eye discharge, vision loss, eye pain, photophobia, earache, ear discharge, tinnitus, decreased hearing, nasal  congestion, nosebleeds, sore throat, hoarseness, chest pain, palpitations, syncope, dyspnea on exertion, orthopnea, PND, peripheral edema, cough, dyspnea at rest, excessive sputum, hemoptysis, wheezing, pleurisy, nausea, vomiting, diarrhea, constipation, change in bowel habits, abdominal pain, melena, hematochezia, jaundice, indigestion/heartburn, dysphagia, odynophagia, dysuria, hematuria, urinary frequency, urinary hesitancy, nocturia, incontinence, back pain, joint swelling, muscle weakness, stiffness, arthritis, sciatica, restless legs, leg pain at night, leg pain with exertion, rash, itching, dryness, suspicious lesions, paralysis, paresthesias, seizures, tremors, vertigo, transient blindness, frequent falls, frequent headaches, difficulty walking, depression, anxiety, memory loss, confusion, cold intolerance, heat intolerance, polydipsia, polyphagia, polyuria, unusual weight change, abnormal bruising, bleeding, enlarged lymph nodes, urticaria, allergic rash, hay fever, and recurrent infections.    Vital Signs:  Patient profile:   65 year old male Height:      69 inches Weight:      212 pounds O2 Sat:      98 % on Room air Temp:     97.2 degrees F oral Pulse rate:   61 / minute BP sitting:   132 / 80  (left arm) Cuff size:   regular  Vitals Entered By: Elray Buba RN (September 02, 2010 9:32 AM)  O2 Flow:  Room air CC: Yearly ROV & CPX... Is Patient Diabetic? No Comments Medications reviewed with patient ,phone # verifed with pt. Elray Buba RN  September 02, 2010 9:32 AM    Physical Exam  Additional Exam:  WD, WN, 64 WM in NAD... GENERAL:  Alert & oriented; pleasant & cooperative. HEENT:  [place]/AT, EOM-wnl, PERRLA, Fundi-benign, EACs-clear, TMs-wnl, NOSE-clear, THROAT-clear & wnl. NECK:  Supple w/ full ROM; no JVD; normal carotid impulses w/o bruits; no thyromegaly or nodules palpated; no lymphadenopathy. CHEST:  Clear to P & A; without wheezes/ rales/ or rhonchi. HEART:  Regular  Rhythm; without murmurs/ rubs/ or gallops. ABDOMEN:  Soft & nontender; normal bowel sounds; no organomegaly or masses detected. EXT: without deformities or arthritic changes; no varicose veins/ venous insuffic/ or edema. NEURO:  CN's intact;  no focal neuro deficits... DERM:  No lesions noted; no rash etc...    Impression & Recommendations:  Problem # 1:  PHYSICAL EXAMINATION (ICD-V70.0)  Orders: 12 Lead EKG (12 Lead EKG) T-2 View CXR (71020TC) TLB-BMP (Basic Metabolic Panel-BMET) (80048-METABOL) TLB-Hepatic/Liver Function Pnl (80076-HEPATIC) TLB-CBC Platelet - w/Differential (85025-CBCD) TLB-Lipid Panel (80061-LIPID) TLB-TSH (Thyroid Stimulating Hormone) (84443-TSH) TLB-PSA (Prostate Specific Antigen) (84153-PSA) TLB-Udip w/ Micro (81001-URINE)  Problem # 2:  HYPERTENSION (ICD-401.9) Controlled>  same meds. His updated medication list for this problem includes:    Metoprolol Tartrate 50 Mg Tabs (Metoprolol tartrate) .Marland Kitchen... Take 1/2 tablet by mouth two times a day    Altace 10 Mg Caps (Ramipril) .Marland Kitchen... 1 by mouth once daily  Problem # 3:  CORONARY ARTERY DISEASE (ICD-414.00) Stable w/o angina... needs incr activity & we discussed this... The following medications were removed from the medication list:    Nitrostat 0.4 Mg Subl (Nitroglycerin) .Marland Kitchen... Place 1 tab under stongue every 4 minutes until chest pain is gone up to 3 doses. no relief - get  help. His updated medication list for this problem includes:    Bayer Aspirin Ec Low Dose 81 Mg Tbec (Aspirin) .Marland Kitchen... Take 1 tablet by mouth once a day    Metoprolol Tartrate 50 Mg Tabs (Metoprolol tartrate) .Marland Kitchen... Take 1/2 tablet by mouth two times a day    Altace 10 Mg Caps (Ramipril) .Marland Kitchen... 1 by mouth once daily  Problem # 4:  HYPERLIPIDEMIA (ICD-272.4) Chol is OK, it's the Tg that are elevated... discussed low fat wt reducing diet... His updated medication list for this problem includes:    Lipitor 20 Mg Tabs (Atorvastatin calcium) .Marland Kitchen...  Take 1 tablet by mouth once a day  Problem # 5:  GERD (ICD-530.81) GI is stable & up to date... His updated medication list for this problem includes:    Nexium 40 Mg Cpdr (Esomeprazole magnesium) .Marland Kitchen... Take 1 by mouth once daily  Problem # 6:  DEGENERATIVE JOINT DISEASE (ICD-715.90) Stable & able to incr exercise program... His updated medication list for this problem includes:    Bayer Aspirin Ec Low Dose 81 Mg Tbec (Aspirin) .Marland Kitchen... Take 1 tablet by mouth once a day  Complete Medication List: 1)  Bayer Aspirin Ec Low Dose 81 Mg Tbec (Aspirin) .... Take 1 tablet by mouth once a day 2)  Metoprolol Tartrate 50 Mg Tabs (Metoprolol tartrate) .... Take 1/2 tablet by mouth two times a day 3)  Altace 10 Mg Caps (Ramipril) .Marland Kitchen.. 1 by mouth once daily 4)  Lipitor 20 Mg Tabs (Atorvastatin calcium) .... Take 1 tablet by mouth once a day 5)  Nexium 40 Mg Cpdr (Esomeprazole magnesium) .... Take 1 by mouth once daily 6)  Multivitamins Tabs (Multiple vitamin) .... Take 1 tablet by mouth once a day 7)  Mega Red  .... Take 1 capsule by mouth once a day  Other Orders: Tdap => 29yrs IM (16109) Admin 1st Vaccine (60454)  Patient Instructions: 1)  Today we updated your med list- see below.... 2)  We refilled your meds for 2012... 3)  Today we did your follow up CXR, EKG, and Fasting blood work... please call the "phone tree" in a few days for your lab results.Marland KitchenMarland Kitchen 4)  [name], Let's get on track w/ our diet & exercise program> the goal is to lose 10-15 lbs... 5)  We gave you the combination Tetanus vaccine called the TDAP (good for 42yrs)...  6)  Call for any problems.Marland KitchenMarland Kitchen 7)  Please schedule a follow-up appointment in 1 year, sooner as needed. Prescriptions: LIPITOR 20 MG  TABS (ATORVASTATIN CALCIUM) Take 1 tablet by mouth once a day  #90 x 4   Entered and Authorized by:   Michele Mcalpine MD   Signed by:   Michele Mcalpine MD on 09/02/2010   Method used:   Print then Give to Patient   RxID:    [id number] ALTACE 10 MG CAPS (RAMIPRIL) 1 by mouth once daily  #90 x 4   Entered and Authorized by:   Michele Mcalpine MD   Signed by:   Michele Mcalpine MD on 09/02/2010   Method used:   Print then Give to Patient   RxID:   [id number] METOPROLOL TARTRATE 50 MG  TABS (METOPROLOL TARTRATE) Take 1/2 tablet by mouth two times a day  #90 x 4   Entered and Authorized by:   Michele Mcalpine MD   Signed by:   Michele Mcalpine MD on 09/02/2010   Method used:   Print then Give to  Patient   RxID:   [id number]    Immunization History:  Influenza Immunization History:    Influenza:  historical (03/25/2010)  Immunizations Administered:  Tetanus Vaccine:    Vaccine Type: Tdap    Site: left deltoid    Mfr: GlaxoSmithKline    Dose: 0.5 ml    Route: IM    Given by: Randell Loop CMA    Exp. Date: 05/13/2012    Lot #: [id number]    VIS given: 06/11/08 version given September 02, 2010.

## 2010-10-19 ENCOUNTER — Other Ambulatory Visit: Payer: Self-pay | Admitting: Adult Health

## 2010-12-10 NOTE — H&P (Signed)
[facility]  Patient:    Brian Todd, Brian Todd                      MRN: [id number] Adm. Date:  [id number] Attending:  Verdene Lennert CC:         Lonzo Cloud. Kriste Basque, M.D. [facility]   History and Physical  DATE OF BIRTH:  October 19, 1944  CHIEF COMPLAINT:  Chest pain going into both arms.  HISTORY OF PRESENT ILLNESS:  The patient is a 65 year old white male who presented to night with severe pain going into both shoulders and arms that started perhaps when he went to bed and was going on for 15-20 minutes.  It was severe enough to cause weakness and diaphoresis.  There was no shortness of breath, he did not pass out.  He had two episodes at rest tonight.  Prior to that for one week he had exertional episodes similar to the above which would be relieved with rest.  PAST MEDICAL HISTORY:  Hypertension, GERD.  He had a stress test negative in December.  MEDICATIONS: 1. Toprol XL 100 mg 1/2 1 x q.d. 2. Nexium 40 mg 1 x q.d. 3. Aspirin, he took it once yesterday.  SOCIAL HISTORY:  Nonsmoker/nondrinker.  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  FAMILY HISTORY:  Negative for coronary artery disease, positive for liver failure in mother.  ALLERGIES:  No known drug allergies.  REVIEW OF SYSTEMS:  Exertional chest pain going into both arms as described above.  No syncope.  Occasional problems with indigestion well-controlled on Nexium.  No abdominal pain.  No trouble swallowing.  No blood looking stools. No leg swelling.  The rest is negative.  PHYSICAL EXAMINATION:  VITAL SIGNS:  Blood pressure 190/104, heart rate 73, respirations 20, temperature 97.9, and O2 saturation 98% on room air.  GENERAL:  He is in no acute distress.  Looks well.  HEENT:  Moist mucous.  NECK:  Supple without thyromegaly or bruit.  LUNGS:  Clear to auscultation and percussion, no wheezes or rales.  HEART:  S1, S2, no enlargement to percussion.  Chest wall without deformities. No  tenderness to palpation.  ABDOMEN:  Soft and nontender, no masses felt.  No organomegaly.  EXTREMITIES:  Lower extremities without edema.  NEUROLOGIC:  Cranial nerves II-XII normal.  Motor strength normal.  He is alert, oriented, and cooperative.  RECTAL:  Not done.  X-RAYS:  EKG normal sinus rhythm with nonspecific ST changes.  Chest x-ray is clear per Dr. Adriana Simas.  LABORATORY:  White count 7.2, hemoglobin 14.3.  CK 124, CK-MB 1.7, troponin 0.08.  Glucose 138.  ASSESSMENT/PLAN: 1. Chest pain suspicious for unstable angina.  He took one aspirin several    hours ago.  Obtain cardiology consult tomorrow.  Start on heparin IV and    will use morphine p.r.n.  Nitroglycerin as needed and start an ACE    inhibitor.  Obtain fasting lipid profile. 2. Hypertension.  Start on Prinivil 20 mg q.d.  Give Toprol XL now. 3. Gastroesophageal reflux disease, continue current therapy with Nexium. 4. Elevated glucose, obtain hemoglobin A1C. DD:  12/18/00 TD:  12/18/00 Job: [id number]

## 2010-12-10 NOTE — Assessment & Plan Note (Signed)
Brian Todd                             PULMONARY OFFICE NOTE   Brian Todd, Brian Todd                      MRN:          [id number]  DATE:10/16/2006                            DOB:          1946/05/20    HISTORY OF PRESENT ILLNESS:  The patient is a 65 year old white male,  patient of Dr. Kriste Todd, who has a known history of hypertension,  arteriosclerotic heart disease, and hyperlipidemia presents for an acute  office visit.  The patient complains of a 5-day history of progressively  worsening cough, congestion, sore throat, and fever, chills, sweats.  The patient was initially seen by his primary care physician and  prescribed a Z-Pak and Tussionex which have not helped and symptoms seem  to be worsening.  The patient denies any hemoptysis, orthopnea, PND, or  leg swelling.   PAST MEDICAL HISTORY:  Reviewed.   CURRENT MEDICATIONS:  Reviewed.   PHYSICAL EXAMINATION:  GENERAL:  The patient is a pleasant male in no  acute distress.  VITAL SIGNS:  He is afebrile with stable vital signs.  O2 saturation is  100% on room air.  HEENT:  Unremarkable.  NECK:  Supple without adenopathy.  No JVD.  RESPIRATORY:  Lung sounds reveal coarse breath sounds without any  wheezing or crackles.  CARDIAC:  Regular rate and rhythm.  ABDOMEN:  Soft and nontender.  EXTREMITIES:  Warm without any edema.   IMPRESSION:  Acute tracheal bronchitis.   PLAN:  1. The patient is to begin Avelox x5 days, Mucinex DM twice daily,      prednisone taper over the next week.  2. The patient is to return back with Dr. Kriste Todd as scheduled or sooner      if needed.      Brian Oaks, NP  Electronically Signed      Brian Todd. Brian Basque, MD  Electronically Signed   TP/MedQ  DD: 10/16/2006  DT: 10/16/2006  Job #: [id number]

## 2010-12-10 NOTE — Discharge Summary (Signed)
[place]. [facility]  Patient:    Brian Todd, Brian Todd                      MRN: [id number] Adm. Date:  [id number] Disc. Date: [id number] Attending:  Lenoria Farrier Dictator:   Abelino Derrick, P.A.C. [facility]                  Referring Physician Discharge [place]  HISTORY OF PRESENT ILLNESS:  Brian Todd is a 65 year old male followed by Dr. Kriste Basque who has no prior history of coronary disease, but does have risk factors including hypertension and hyperlipidemia who was admitted to [facility] Emergency Room on Dec 15, 2000, for evaluation of chest pain.  He was treated at [facility]  His troponins were slightly positive at 0.08, and he was transferred to [facility] for a cardiac catheterization May 28.  HOSPITAL COURSE:  He was started on IV heparin, aspirin.  He was already on beta-blocker.  The patient underwent cardiac catheterization May 28 by Dr. Juanda Chance.  He is enrolled in the TAXUS IV trial.  He underwent stenting to the LAD with a good final result.  He had a residual 40% OM-2 narrowing.  His EF was 60%.  Dr. Juanda Chance suggested he have Plavix for six months under the TAXUS IV trial.  CKs did come up to CK 196 with 15 MBs.  These were coming down by the time he was discharged.  He was kept an extra 24 hours because of the bump in his enzymes.  During his hospitalization, a lipid profile was obtained that showed an LDL of 176, HDL 41, triglycerides 215.  He was put on Lipitor 20.  DISCHARGE FOLLOWUP:  He is discharged Dec 21, 2000, and will follow up with Dr. Andee Lineman or the office P.A. in about two weeks in [place].  DISCHARGE MEDICATIONS: 1. Nexium 40 mg q.d. 2. Toprol XL 100 mg q.d. 3. Plavix 75 mg q.d. x 6 months. 4. Coated aspirin q.d. 5. Lipitor 20 mg q.d. 6. Altace 5 mg q.d. 7. Nitroglycerin sublingual p.r.n.  LABORATORY AND X-RAY DATA:  Sodium 139, potassium 4.3, BUN 11, creatinine 1.1. White count 10.7, hemoglobin 14.4, hematocrit 42.1, platelets 252.   CKs peaked at 196, 15 MBs.  Chest x-ray shows borderline heart size with question of LV fullness.  EKG shows sinus rhythm with nonspecific changes.  DISCHARGE DIAGNOSES: 1. Subendocardial myocardial infarction treated with left anterior descending    stenting this admission. 2. Treated hypertension. 3. Treated hyperlipidemia. 4. Gastroesophageal reflux.  DISPOSITION:  The patient discharged in stable condition and will follow up with the office P.A. or Dr. Andee Lineman in about two weeks.  He needs a BMP at that time.  He will need lipid profile and LFTs in six weeks. DD:  12/21/00 TD:  12/21/00 Job: [id number]

## 2010-12-10 NOTE — Cardiovascular Report (Signed)
[place]. [facility]  Patient:    Brian Todd, Brian Todd                      MRN: [id number] Proc. Date: 12/19/00 Adm. Date:  [id number] Attending:  Lenoria Farrier CC:         Lonzo Cloud. Kriste Basque, M.D. [facility]  Lewayne Bunting, M.D.  Cardiopulmonary Laboratory   Cardiac Catheterization  PROCEDURES PERFORMED:  Cardiac catheterization and stent implantation.  INDICATIONS:  The patient is 65 years old and has no prior history of known heart disease, although he had a treadmill test last fall which was reportedly negative.  He was admitted with chest pain and positive troponins consistent with a non-ST segment elevation infarction.  DESCRIPTION OF PROCEDURE:  The procedure was performed via the right femoral artery using an arterial sheath and 6 French preformed coronary catheters.  A front wall arterial puncture was performed and Omnipaque contrast was used. After completion of the diagnostic study, we made a decision to proceed with intervention of the lesion in the left anterior descending artery.  We had difficulty getting an IV, so a venous line was placed in the right femoral vein.  The patient was given weight-adjusted heparin to prolong the ACT to greater than 200 seconds and was given double bolus Integrilin in infusion.  He was consented and randomized in the TAXUS IV trial and was randomized to either a coated or non-coated 2.5 x 16 mm Express stent.  We used a JL 3.5, 7 [place], guiding catheter and a short luge wire.  We crossed the lesion in the mid LAD without difficulty.  We pre-dilated with a 2.75 x 15 mm Quantum Ranger and performed a total of five inflations up to 10 atmospheres for 24 seconds.  We then deployed a 2.5 x 16 mm TAXUS stent with one inflation of 15 atmospheres for 41 atmospheres.  We then post-dilated with the 2.75 Quantum Ranger at the proximal edge of the stent with one inflation of 16 atmospheres for 35 seconds.  We then post-dilated  with a 3.0 x 9 mm Quantum Ranger at the distal edge of the stent where the distal reference vessel appeared larger with one inflation of 15 atmospheres for 32 seconds. Repeat diagnostic studies were then performed with a guiding catheter.  The patient tolerated the procedure well and left the laboratory in satisfactory condition.  RESULTS:  The left main coronary artery:  The left main coronary artery was free of significant disease.  Left anterior descending:  The left anterior descending artery gave rise to a small and large diagonal branch and three septal perforators.  There was 95% stenosis in the mid vessel at the third septal perforator.  There was some segmental disease that extended up to the diagonal branch, although it was only about 30% narrowed in the proximal portion of that segmental disease.  Circumflex artery:  The circumflex artery gave rise to two marginal branches, an atrial branch, and two posterolateral branches.  There was 40% ostial narrowing in the second marginal branch.  Right coronary artery:  The right coronary is a moderate sized vessel that gave rise to a right ventricular branch, posterior descending branch and two posterolateral branches.  There was irregularity in the vessel but no major obstruction.  LEFT VENTRICULOGRAPHY:  The left ventriculogram performed in the RAO projection showed good wall motion with no areas of hypokinesis.  The estimated ejection fraction was 60%.  Following stenting of the  lesion in the proximal LAD the stenosis improved from 95% to 0%.  There was no dissection seen.  CONCLUSIONS: 1. Status post recent non-Q-wave myocardial infarction with 95% stenosis    in the mid left anterior descending, 40% narrowing in the second    marginal branch of the circumflex artery, irregularities in the right    coronary artery and normal left ventricular function. 2. Successful stent deployment in the mid left anterior descending  artery    with the TAXUS stent with improvement in percent diameter narrowing    from 95% to 0%.  DISPOSITION:  The patient was returned to the postangioplasty unit for further observation.  He will need to remain on Plavix for a full six months as part of the trial. DD:  12/19/00 TD:  12/19/00 Job: [id number]

## 2011-03-21 ENCOUNTER — Other Ambulatory Visit: Payer: Self-pay | Admitting: Adult Health

## 2011-04-19 ENCOUNTER — Encounter: Payer: Self-pay | Admitting: Pulmonary Disease

## 2011-08-26 ENCOUNTER — Other Ambulatory Visit: Payer: Self-pay | Admitting: Allergy

## 2011-08-26 MED ORDER — ESOMEPRAZOLE MAGNESIUM 40 MG PO CPDR: 40.0000 mg | DELAYED_RELEASE_CAPSULE | Freq: Every day | ORAL | Status: DC

## 2011-08-26 NOTE — Telephone Encounter (Signed)
Refilled nexium  Pt has appt with Dr Kriste Basque Feb 2013

## 2011-09-06 ENCOUNTER — Encounter: Payer: Self-pay | Admitting: *Deleted

## 2011-09-08 ENCOUNTER — Ambulatory Visit (INDEPENDENT_AMBULATORY_CARE_PROVIDER_SITE_OTHER)
Admission: RE | Admit: 2011-09-08 | Discharge: 2011-09-08 | Disposition: A | Discharge status: Home / Self Care | Payer: BC Managed Care – PPO | Source: Ambulatory Visit | Attending: Pulmonary Disease | Admitting: Pulmonary Disease

## 2011-09-08 ENCOUNTER — Other Ambulatory Visit (INDEPENDENT_AMBULATORY_CARE_PROVIDER_SITE_OTHER): Payer: BC Managed Care – PPO

## 2011-09-08 ENCOUNTER — Encounter: Payer: Self-pay | Admitting: Pulmonary Disease

## 2011-09-08 ENCOUNTER — Ambulatory Visit (INDEPENDENT_AMBULATORY_CARE_PROVIDER_SITE_OTHER): Payer: BC Managed Care – PPO | Admitting: Pulmonary Disease

## 2011-09-08 VITALS — BP 120/76 | HR 60 | Temp 97.0°F | Ht 69.0 in | Wt 207.0 lb

## 2011-09-08 DIAGNOSIS — I2510 Atherosclerotic heart disease of native coronary artery without angina pectoris: Secondary | ICD-10-CM

## 2011-09-08 DIAGNOSIS — I10 Essential (primary) hypertension: Secondary | ICD-10-CM

## 2011-09-08 DIAGNOSIS — Z0000 Encounter for general adult medical examination without abnormal findings: Secondary | ICD-10-CM

## 2011-09-08 DIAGNOSIS — K589 Irritable bowel syndrome without diarrhea: Secondary | ICD-10-CM

## 2011-09-08 LAB — CBC WITH DIFFERENTIAL/PLATELET
Basophils Absolute: 0 10*3/uL (ref 0.0–0.1)
Eosinophils Absolute: 0.2 10*3/uL (ref 0.0–0.7)
Lymphocytes Relative: 28.3 % (ref 12.0–46.0)
MCHC: 33.3 g/dL (ref 30.0–36.0)
MCV: 91.8 fl (ref 78.0–100.0)
Monocytes Absolute: 0.7 10*3/uL (ref 0.1–1.0)
Neutrophils Relative %: 59.4 % (ref 43.0–77.0)
Platelets: 215 10*3/uL (ref 150.0–400.0)

## 2011-09-08 LAB — HEPATIC FUNCTION PANEL
ALT: 33 U/L (ref 0–53)
AST: 25 U/L (ref 0–37)
Albumin: 4.1 g/dL (ref 3.5–5.2)
Total Protein: 6.9 g/dL (ref 6.0–8.3)

## 2011-09-08 LAB — TSH: TSH: 1.69 u[IU]/mL (ref 0.35–5.50)

## 2011-09-08 LAB — URINALYSIS
Bilirubin Urine: NEGATIVE
Hgb urine dipstick: NEGATIVE
Leukocytes, UA: NEGATIVE
Nitrite: NEGATIVE
Urobilinogen, UA: 0.2 (ref 0.0–1.0)

## 2011-09-08 LAB — LIPID PANEL
Cholesterol: 149 mg/dL (ref 0–200)
LDL Cholesterol: 86 mg/dL (ref 0–99)
Triglycerides: 118 mg/dL (ref 0.0–149.0)

## 2011-09-08 LAB — BASIC METABOLIC PANEL
BUN: 17 mg/dL (ref 6–23)
CO2: 29 mEq/L (ref 19–32)
Chloride: 102 mEq/L (ref 96–112)
Creatinine, Ser: 1 mg/dL (ref 0.4–1.5)
Glucose, Bld: 95 mg/dL (ref 70–99)
Potassium: 4.8 mEq/L (ref 3.5–5.1)

## 2011-09-08 MED ORDER — RAMIPRIL 10 MG PO CAPS: 10.0000 mg | ORAL_CAPSULE | Freq: Every day | ORAL | Status: DC

## 2011-09-08 MED ORDER — ESOMEPRAZOLE MAGNESIUM 40 MG PO CPDR: 40.0000 mg | DELAYED_RELEASE_CAPSULE | Freq: Every day | ORAL | Status: DC

## 2011-09-08 MED ORDER — ATORVASTATIN CALCIUM 20 MG PO TABS: 20.0000 mg | ORAL_TABLET | Freq: Every day | ORAL | Status: DC

## 2011-09-08 MED ORDER — METOPROLOL TARTRATE 50 MG PO TABS: ORAL_TABLET | ORAL | Status: DC

## 2011-09-08 MED ORDER — NITROGLYCERIN 0.4 MG SL SUBL: 0.4000 mg | SUBLINGUAL_TABLET | SUBLINGUAL | Status: DC | PRN

## 2011-09-08 NOTE — Patient Instructions (Signed)
Today we updated your med list in our EPIC system...    Continue your current medications the same...    We refilled your meds for 90d supplies as requested...  Today we did your follow up CXR, EKG, & fasting blood work...    Please call the PHONE TREE in a few days for your results...    Dial [id number] & when prompted enter your patient number followed by the # symbol...    Your patient number is:  [id number]  Stay as active as possible & work on weight reduction...  Call for any problems...  Let's continue our yearly check-ups, call sooner if needed for any reason.Marland KitchenMarland Kitchen

## 2011-09-08 NOTE — Progress Notes (Addendum)
Subjective:     Patient ID: Brian Todd, male   DOB: September 04, 1945, 66 y.o.   MRN: [id number]  HPI 66 y/o WM here for a follow up visit... he has HBP, CAD w/ prev stent, Hyperchol... stable on meds- no new complaints or concerns...   ~  July 30, 2009:  he saw TP 10/10 & had meds adjusted- Metop decr to 25mg Bid & Altace incr to 10mg /d... BP's OK on these meds but weight up 15# & he is committed to diet + exercise program... denies angina, SOB, edema, etc... Chol looks good on Lip20... he had his Flu shot in Oct10...  ~  September 11, 2010:  here for CPX> doing well w/o new complaints or concerns... unfortunately wt is up further & we discussed diet & exercise program... denies CP, palpit, SOB, edema, etc...  BP controlled on BBLocker/ ACE;  Chol looks good on Statin but incr TG noted w/ incr wt & we discussed this;  GERD controlled w/ PPI;  PSA remains normal;  he had Flu shot last fall, OK TDAP today...  ~  September 08, 2011:  Yearly ROV & CPX> Brian Todd has had a good year, no new complaints or concerns;  He is exercising on treadmill regularly, & has lost 5# down to 207# today;  BP well controlled & denies CP, palpit, SOB, edema, etc;  FLP looks good on Lip20 + MegaRed w/ Krill Oil... SEE PROBLEM LIST BELOW>> CXR 2/13 showed heart at upper lim normal for size, clear lungs, NAD.Marland KitchenMarland Kitchen EKG 2/13 showed SBrady, rate54, WNL/ NAD... LABS 2/13 showed FLP- at goals on Lip20;  Chems- wnl;  CBC' wnl;  TSH=1.69;  PSA=1.69,  UA- clear...   PROBLEM LIST:    HEARING LOSS, MILD (ICD-389.9) - eval by DrKraus in 2005...  HYPERTENSION (ICD-401.9) - on Rx w/ METOPROLOL 50mg - 1/2tab Bid, ALTACE 10mg /d...  ~  2/12:  BP= 132/80 and similar at home; weight up to 212# as noted; denies HA, fatigue, visual changes, CP, palipit, dizziness, syncope, dyspnea, edema, etc> we discussed diet + exercise program. ~  2/13:  BP= 120/76 & wt down to 207#; he remains essent asymptomatic 7 is exercising regularly on treadmill  etc...  CORONARY ARTERY DISEASE (ICD-414.00) - on above + ASA 81mg /d; he has never had to take any of his NTG... he had a nonQ-MI 5/02 with cath showing 95% stenosis of mid LAD- s/p PTCA/ stent... otherw he had some non-obstructive dis... denies CP, palpit, SOB, dizzy, edema, etc... ~  last NuclearStressTest 3/04 showed no ischemia or infarct & EF=59%... ~  CXR 2/13 showed heart at upper lim normal for size, clear lungs, NAD.Marland KitchenMarland Kitchen  EKG 2/13 showed SBrady, rate54, WNL/ NAD...  HYPERLIPIDEMIA (ICD-272.4) - on LIPITOR 20mg /d, +Fish Oil 1000mg Bid... ~  FLP 4/09 showed TChol 139, TG 174, HDL 33, LDL 71... rec same meds + better diet/ get wt down. ~  FLP 10/09 showed TChol 102, TG 70, HDL 40, LDL 48... GREAT! same Rx... ~  FLP 9/10 showed TChol 143, TG 112, HDL 36, LDL 84 ~  FLP 2/12 showed TChol 150, TG 225, HDL 36, LDL 77... rising TG w/ incr wt> needs better diet. ~  FLP 2/13 on Lip20 showed TChol 149, TG 118, HDL 39, LDL 86... Need to be better, discussed diet 7 wt reduction.  GERD (ICD-530.81) - on NEXIUM 40mg /d... hx PUD w/ DU 1997... ~  last EGD 12/97 by DrKaplan showed mult post-bulbar ulcers...   IRRITABLE BOWEL SYNDROME (ICD-564.1) -prev on  Levsin as needed... ~  last colonoscopy 11/07 was WNL.Marland Kitchen. repeat planned 42yrs...  Hx of GALLBLADDER DISEASE (ICD-575.9) - GB sludge on sonar 1997... s/p ERCP by DrKaplan which was neg x for post bulbar ulcers seen...   Hx of PSA, INCREASED (ICD-790.93) - PSA was 5.00 in 2009 assoc w/ mild prostatitis infection... treated w/ antibiotics and repeat PSA= 2.70 in May09... ~  last labs 9/10 showed PSA= 1.02 ~  labs 2/12 showed PSA= 1.25 ~  Labs 2/13 showed PSA= 1.69  DEGENERATIVE JOINT DISEASE (ICD-715.90) - c/o mild DJD in hands etc... OTC Rx Prn.   Past Surgical History  Procedure Date  . Tonsillectomy and adenoidectomy     in the 2nd grade    Outpatient Encounter Prescriptions as of 09/08/2011  Medication Sig Dispense Refill  . aspirin 81 MG  tablet Take 81 mg by mouth daily.      Marland Kitchen atorvastatin (LIPITOR) 20 MG tablet Take 20 mg by mouth daily.      Marland Kitchen esomeprazole (NEXIUM) 40 MG capsule Take 1 capsule (40 mg total) by mouth daily.  30 capsule  4  . metoprolol (LOPRESSOR) 50 MG tablet Take 1/2 tablet by mouth two times daily      . Multiple Vitamins-Minerals (MULTIVITAMIN & MINERAL PO) Take 1 tablet by mouth daily.      . nitroGLYCERIN (NITROSTAT) 0.4 MG SL tablet Place 0.4 mg under the tongue every 5 (five) minutes as needed.      . ramipril (ALTACE) 10 MG capsule Take 10 mg by mouth daily.        No Known Allergies   Current Medications, Allergies, Past Medical History, Past Surgical History, Family History, and Social History were reviewed in [employer] record.    Review of Systems        The patient complains of gas/bloating, joint pain, and muscle cramps.  The patient denies fever, chills, sweats, anorexia, fatigue, weakness, malaise, weight loss, sleep disorder, blurring, diplopia, eye irritation, eye discharge, vision loss, eye pain, photophobia, earache, ear discharge, tinnitus, decreased hearing, nasal congestion, nosebleeds, sore throat, hoarseness, chest pain, palpitations, syncope, dyspnea on exertion, orthopnea, PND, peripheral edema, cough, dyspnea at rest, excessive sputum, hemoptysis, wheezing, pleurisy, nausea, vomiting, diarrhea, constipation, change in bowel habits, abdominal pain, melena, hematochezia, jaundice, indigestion/heartburn, dysphagia, odynophagia, dysuria, hematuria, urinary frequency, urinary hesitancy, nocturia, incontinence, back pain, joint swelling, muscle weakness, stiffness, arthritis, sciatica, restless legs, leg pain at night, leg pain with exertion, rash, itching, dryness, suspicious lesions, paralysis, paresthesias, seizures, tremors, vertigo, transient blindness, frequent falls, frequent headaches, difficulty walking, depression, anxiety, memory loss, confusion, cold  intolerance, heat intolerance, polydipsia, polyphagia, polyuria, unusual weight change, abnormal bruising, bleeding, enlarged lymph nodes, urticaria, allergic rash, hay fever, and recurrent infections.    Objective:   Physical Exam     WD, WN, 65 WM in NAD... GENERAL:  Alert & oriented; pleasant & cooperative. HEENT:  [place]/AT, EOM-wnl, PERRLA, Fundi-benign, EACs-clear, TMs-wnl, NOSE-clear, THROAT-clear & wnl. NECK:  Supple w/ full ROM; no JVD; normal carotid impulses w/o bruits; no thyromegaly or nodules palpated; no lymphadenopathy. CHEST:  Clear to P & A; without wheezes/ rales/ or rhonchi. HEART:  Regular Rhythm; without murmurs/ rubs/ or gallops. ABDOMEN:  Soft & nontender; normal bowel sounds; no organomegaly or masses detected. EXT: without deformities or arthritic changes; no varicose veins/ venous insuffic/ or edema. NEURO:  CN's intact;  no focal neuro deficits... DERM:  No lesions noted; no rash etc...  RADIOLOGY DATA:  Reviewed in  the EPIC EMR & discussed w/ the patient...    >>CXR 2/13 showed heart at upper lim normal for size, clear lungs, NAD...    >>EKG 2/13 showed SBrady, rate54, WNL/ NAD...  LABORATORY DATA:  Reviewed in the EPIC EMR & discussed w/ the patient...    >>LABS 2/13 showed FLP- at goals on Lip20;  Chems- wnl;  CBC' wnl;  TSH=1.69;  PSA=1.69,  UA- clear...   Assessment:     HBP>  Controlled on BBlocker, ACE; tol well; continue same w/ better diet & get wt down...  CAD>  S/p PTCA & stent in 2002; he had nonobstructive dis elsewhere; ?Cards has released him from further follow up?  Hyperlipid>  Improved & at goals on diet + Lip20; continue same + wt reduction...  GI> GERD, IBS, Hx GB sludge>  On Nexium40 & doing satis; up to date on colon screening; LFTs remain wnl...  DJD>  Mild diffuse generalized osteoarthritis; he uses OTC meds as needed..     Plan:     Patient's Medications  New Prescriptions   No medications on file  Previous Medications    ASPIRIN 81 MG TABLET    Take 81 mg by mouth daily.   MULTIPLE VITAMINS-MINERALS (MULTIVITAMIN & MINERAL PO)    Take 1 tablet by mouth daily.  Modified Medications   Modified Medication Previous Medication   ATORVASTATIN (LIPITOR) 20 MG TABLET atorvastatin (LIPITOR) 20 MG tablet      Take 1 tablet (20 mg total) by mouth daily.    Take 20 mg by mouth daily.   ESOMEPRAZOLE (NEXIUM) 40 MG CAPSULE esomeprazole (NEXIUM) 40 MG capsule      Take 1 capsule (40 mg total) by mouth daily.    Take 1 capsule (40 mg total) by mouth daily.   METOPROLOL (LOPRESSOR) 50 MG TABLET metoprolol (LOPRESSOR) 50 MG tablet      Take 1/2 tablet by mouth two times daily    Take 1/2 tablet by mouth two times daily   NITROGLYCERIN (NITROSTAT) 0.4 MG SL TABLET nitroGLYCERIN (NITROSTAT) 0.4 MG SL tablet      Place 1 tablet (0.4 mg total) under the tongue every 5 (five) minutes as needed.    Place 0.4 mg under the tongue every 5 (five) minutes as needed.   RAMIPRIL (ALTACE) 10 MG CAPSULE ramipril (ALTACE) 10 MG capsule      Take 1 capsule (10 mg total) by mouth daily.    Take 10 mg by mouth daily.  Discontinued Medications   No medications on file

## 2011-10-23 ENCOUNTER — Encounter: Payer: Self-pay | Admitting: Pulmonary Disease

## 2012-02-06 ENCOUNTER — Other Ambulatory Visit: Payer: Self-pay | Admitting: Pulmonary Disease

## 2012-04-25 ENCOUNTER — Telehealth: Payer: Self-pay | Admitting: Pulmonary Disease

## 2012-04-25 NOTE — Telephone Encounter (Signed)
I spoke with pt and he stated he fell off the later few weeks. Last week he noticed a small know on his right leg near his ankle. Does not bother him, can walk fine. Pt is coming in to see TP in the AM at 9. Pt aware to seek emergency care if needed. Nothing further was needed

## 2012-04-26 ENCOUNTER — Encounter: Payer: Self-pay | Admitting: Adult Health

## 2012-04-26 ENCOUNTER — Ambulatory Visit (INDEPENDENT_AMBULATORY_CARE_PROVIDER_SITE_OTHER): Payer: BC Managed Care – PPO | Admitting: Adult Health

## 2012-04-26 VITALS — BP 134/80 | HR 60 | Temp 98.8°F | Ht 69.0 in | Wt 197.4 lb

## 2012-04-26 DIAGNOSIS — M79609 Pain in unspecified limb: Secondary | ICD-10-CM

## 2012-04-26 DIAGNOSIS — M79606 Pain in leg, unspecified: Secondary | ICD-10-CM

## 2012-04-26 DIAGNOSIS — Z23 Encounter for immunization: Secondary | ICD-10-CM

## 2012-04-26 NOTE — Patient Instructions (Addendum)
Warm compresses to lower leg As needed   We are setting you up for a venous doppler of lower leg Flu shot today  Follow up Dr. Kriste Basque  As planned and As needed   Please contact office for sooner follow up if symptoms do not improve or worsen or seek emergency care

## 2012-04-26 NOTE — Progress Notes (Signed)
Subjective:     Patient ID: Brian Todd, male   DOB: 1946/07/06, 66 y.o.   MRN: [id number]  HPI  66 y/o WM hx HBP, CAD w/ prev stent, Hyperchol...   ~  July 30, 2009:  he saw TP 10/10 & had meds adjusted- Metop decr to 25mg Bid & Altace incr to 10mg /d... BP's OK on these meds but weight up 15# & he is committed to diet + exercise program... denies angina, SOB, edema, etc... Chol looks good on Lip20... he had his Flu shot in Oct10...  ~  September 11, 2010:  here for CPX> doing well w/o new complaints or concerns... unfortunately wt is up further & we discussed diet & exercise program... denies CP, palpit, SOB, edema, etc...  BP controlled on BBLocker/ ACE;  Chol looks good on Statin but incr TG noted w/ incr wt & we discussed this;  GERD controlled w/ PPI;  PSA remains normal;  he had Flu shot last fall, OK TDAP today...  ~  September 08, 2011:  Yearly ROV & CPX> Brian Todd has had a good year, no new complaints or concerns;  He is exercising on treadmill regularly, & has lost 5# down to 207# today;  BP well controlled & denies CP, palpit, SOB, edema, etc;  FLP looks good on Lip20 + MegaRed w/ Krill Oil... SEE PROBLEM LIST BELOW>> CXR 2/13 showed heart at upper lim normal for size, clear lungs, NAD.Marland KitchenMarland Kitchen EKG 2/13 showed SBrady, rate54, WNL/ NAD... LABS 2/13 showed FLP- at goals on Lip20;  Chems- wnl;  CBC' wnl;  TSH=1.69;  PSA=1.69,  UA- clear...  04/26/2012 Acute OV  Complains of knot on lower right leg x3 weeks - fell off step-ladder in yard 1 month ago. Has tenderness along right lower leg w/ tender knot.  No redness or fever.  Step ladder turned over working in yard ~1 month ago causing him to fall  Able to weight bear but still has sore along knot.  Worried he has a blood clot.  No dyspnea, cough or wheezing.    PROBLEM LIST:    HEARING LOSS, MILD (ICD-389.9) - eval by DrKraus in 2005...  HYPERTENSION (ICD-401.9) - on Rx w/ METOPROLOL 50mg - 1/2tab Bid, ALTACE 10mg /d...  ~  2/12:  BP=  132/80 and similar at home; weight up to 212# as noted; denies HA, fatigue, visual changes, CP, palipit, dizziness, syncope, dyspnea, edema, etc> we discussed diet + exercise program. ~  2/13:  BP= 120/76 & wt down to 207#; he remains essent asymptomatic 7 is exercising regularly on treadmill etc...  CORONARY ARTERY DISEASE (ICD-414.00) - on above + ASA 81mg /d; he has never had to take any of his NTG... he had a nonQ-MI 5/02 with cath showing 95% stenosis of mid LAD- s/p PTCA/ stent... otherw he had some non-obstructive dis... denies CP, palpit, SOB, dizzy, edema, etc... ~  last NuclearStressTest 3/04 showed no ischemia or infarct & EF=59%... ~  CXR 2/13 showed heart at upper lim normal for size, clear lungs, NAD.Marland KitchenMarland Kitchen  EKG 2/13 showed SBrady, rate54, WNL/ NAD...  HYPERLIPIDEMIA (ICD-272.4) - on LIPITOR 20mg /d, +Fish Oil 1000mg Bid... ~  FLP 4/09 showed TChol 139, TG 174, HDL 33, LDL 71... rec same meds + better diet/ get wt down. ~  FLP 10/09 showed TChol 102, TG 70, HDL 40, LDL 48... GREAT! same Rx... ~  FLP 9/10 showed TChol 143, TG 112, HDL 36, LDL 84 ~  FLP 2/12 showed TChol 150, TG 225, HDL 36, LDL 77.Marland KitchenMarland Kitchen  rising TG w/ incr wt> needs better diet. ~  FLP 2/13 on Lip20 showed TChol 149, TG 118, HDL 39, LDL 86... Need to be better, discussed diet 7 wt reduction.  GERD (ICD-530.81) - on NEXIUM 40mg /d... hx PUD w/ DU 1997... ~  last EGD 12/97 by DrKaplan showed mult post-bulbar ulcers...   IRRITABLE BOWEL SYNDROME (ICD-564.1) -prev on Levsin as needed... ~  last colonoscopy 11/07 was WNL.Marland Kitchen. repeat planned 85yrs...  Hx of GALLBLADDER DISEASE (ICD-575.9) - GB sludge on sonar 1997... s/p ERCP by DrKaplan which was neg x for post bulbar ulcers seen...   Hx of PSA, INCREASED (ICD-790.93) - PSA was 5.00 in 2009 assoc w/ mild prostatitis infection... treated w/ antibiotics and repeat PSA= 2.70 in May09... ~  last labs 9/10 showed PSA= 1.02 ~  labs 2/12 showed PSA= 1.25 ~  Labs 2/13 showed PSA=  1.69  DEGENERATIVE JOINT DISEASE (ICD-715.90) - c/o mild DJD in hands etc... OTC Rx Prn.   Past Surgical History  Procedure Date  . Tonsillectomy and adenoidectomy     in the 2nd grade    Outpatient Encounter Prescriptions as of 04/26/2012  Medication Sig Dispense Refill  . aspirin 81 MG tablet Take 81 mg by mouth daily.      Marland Kitchen atorvastatin (LIPITOR) 20 MG tablet Take 1 tablet (20 mg total) by mouth daily.  90 tablet  3  . KRILL OIL 1000 MG CAPS Take 1 capsule by mouth daily.      . metoprolol (LOPRESSOR) 50 MG tablet Take 1/2 tablet by mouth two times daily  90 tablet  3  . Multiple Vitamins-Minerals (MULTIVITAMIN & MINERAL PO) Take 1 tablet by mouth daily.      Marland Kitchen NEXIUM 40 MG capsule TAKE ONE CAPSULE BY MOUTH EVERY DAY  30 capsule  4  . nitroGLYCERIN (NITROSTAT) 0.4 MG SL tablet Place 1 tablet (0.4 mg total) under the tongue every 5 (five) minutes as needed.  30 tablet  6  . ramipril (ALTACE) 10 MG capsule Take 1 capsule (10 mg total) by mouth daily.  90 capsule  3  . DISCONTD: esomeprazole (NEXIUM) 40 MG capsule Take 1 capsule (40 mg total) by mouth daily.  90 capsule  3    No Known Allergies   Current Medications, Allergies, Past Medical History, Past Surgical History, Family History, and Social History were reviewed in [employer] record.    Review of Systems  Constitutional:   No  weight loss, night sweats,  Fevers, chills, fatigue, or  lassitude.  HEENT:   No headaches,  Difficulty swallowing,  Tooth/dental problems, or  Sore throat,                No sneezing, itching, ear ache, nasal congestion, post nasal drip,   CV:  No chest pain,  Orthopnea, PND, swelling in lower extremities, anasarca, dizziness, palpitations, syncope.   GI  No heartburn, indigestion, abdominal pain, nausea, vomiting, diarrhea, change in bowel habits, loss of appetite, bloody stools.   Resp: No shortness of breath with exertion or at rest.  No excess mucus, no productive  cough,  No non-productive cough,  No coughing up of blood.  No change in color of mucus.  No wheezing.  No chest wall deformity  Skin: no rash or lesions.  GU: no dysuria, change in color of urine, no urgency or frequency.  No flank pain, no hematuria   MS:  No joint pain or swelling.  No decreased range of motion.  No back pain.  Psych:  No change in mood or affect. No depression or anxiety.  No memory loss.       Objective:   Physical Exam      WD, WN, 65 WM in NAD... GENERAL:  Alert & oriented; pleasant & cooperative. HEENT:  [place]/AT,  EACs-clear, TMs-wnl, NOSE-clear, THROAT-clear & wnl. NECK:  Supple w/ full ROM; no JVD; normal carotid impulses w/o bruits; no thyromegaly or nodules palpated; no lymphadenopathy. CHEST:  Clear to P & A; without wheezes/ rales/ or rhonchi. HEART:  Regular Rhythm; without murmurs/ rubs/ or gallops. ABDOMEN:  Soft & nontender; normal bowel sounds; no organomegaly or masses detected. EXT: without deformities or arthritic changes; mild venous insuffic no  Edema. Along medial right lower leg w/ golf ball sized tender knot -hematoma noted. No redness. Neg homans sign.  NEURO:     no focal neuro deficits... DERM:  No lesions noted; no rash etc...  ..   Assessment:

## 2012-05-01 ENCOUNTER — Encounter (INDEPENDENT_AMBULATORY_CARE_PROVIDER_SITE_OTHER): Payer: BC Managed Care – PPO

## 2012-05-01 DIAGNOSIS — R229 Localized swelling, mass and lump, unspecified: Secondary | ICD-10-CM

## 2012-05-01 DIAGNOSIS — M79606 Pain in leg, unspecified: Secondary | ICD-10-CM

## 2012-05-01 DIAGNOSIS — M79609 Pain in unspecified limb: Secondary | ICD-10-CM

## 2012-05-08 NOTE — Progress Notes (Signed)
Quick Note:  LMOM TCB x1. ______ 

## 2012-05-10 NOTE — Progress Notes (Signed)
Quick Note:  Patient returned call. Advised of lab results / recs as stated by TP. Pt verbalized understanding and denied any questions. ______ 

## 2012-09-07 ENCOUNTER — Ambulatory Visit: Payer: BC Managed Care – PPO | Admitting: Pulmonary Disease

## 2012-09-09 ENCOUNTER — Other Ambulatory Visit: Payer: Self-pay | Admitting: Pulmonary Disease

## 2012-09-10 ENCOUNTER — Telehealth: Payer: Self-pay | Admitting: Pulmonary Disease

## 2012-09-10 DIAGNOSIS — Z0000 Encounter for general adult medical examination without abnormal findings: Secondary | ICD-10-CM

## 2012-09-10 NOTE — Telephone Encounter (Signed)
Called, spoke with pt's wife.  States pt is going to be on Medicare in 2 wks.  Pt had an appt on Feb 14 but this was cancelled d/t weather.  They would like cpx scheduled prior to pt going on Medicare.  We have scheduled him for cpx on Friday, Feb 21 at 10:30 am.  Wife would like labs to be put in so pt can come prior to cpx for these.  Dr. Kriste Basque, pls advise on labs pt will need.  Thank you.

## 2012-09-10 NOTE — Telephone Encounter (Signed)
Called and spoke with pts wife and she stated that they already have an appt with SN on 2/21 and i have placed labs in the computer for the pt and the pts wife is aware and nothing further is needed.

## 2012-09-12 ENCOUNTER — Other Ambulatory Visit (INDEPENDENT_AMBULATORY_CARE_PROVIDER_SITE_OTHER): Payer: BC Managed Care – PPO

## 2012-09-12 DIAGNOSIS — Z0000 Encounter for general adult medical examination without abnormal findings: Secondary | ICD-10-CM

## 2012-09-12 LAB — HEPATIC FUNCTION PANEL
ALT: 22 U/L (ref 0–53)
AST: 19 U/L (ref 0–37)
Albumin: 3.9 g/dL (ref 3.5–5.2)
Total Bilirubin: 1.1 mg/dL (ref 0.3–1.2)
Total Protein: 6.8 g/dL (ref 6.0–8.3)

## 2012-09-12 LAB — CBC WITH DIFFERENTIAL/PLATELET
Basophils Absolute: 0 10*3/uL (ref 0.0–0.1)
Eosinophils Absolute: 0.2 10*3/uL (ref 0.0–0.7)
Lymphocytes Relative: 26.4 % (ref 12.0–46.0)
Lymphs Abs: 2.1 10*3/uL (ref 0.7–4.0)
MCV: 90.5 fl (ref 78.0–100.0)
Neutro Abs: 5.1 10*3/uL (ref 1.4–7.7)
Neutrophils Relative %: 63 % (ref 43.0–77.0)
Platelets: 227 10*3/uL (ref 150.0–400.0)
RBC: 4.96 Mil/uL (ref 4.22–5.81)
WBC: 8.1 10*3/uL (ref 4.5–10.5)

## 2012-09-12 LAB — LIPID PANEL
Cholesterol: 127 mg/dL (ref 0–200)
LDL Cholesterol: 66 mg/dL (ref 0–99)
Triglycerides: 119 mg/dL (ref 0.0–149.0)

## 2012-09-12 LAB — BASIC METABOLIC PANEL
BUN: 20 mg/dL (ref 6–23)
Chloride: 103 mEq/L (ref 96–112)
Creatinine, Ser: 1 mg/dL (ref 0.4–1.5)
Glucose, Bld: 91 mg/dL (ref 70–99)
Potassium: 4.8 mEq/L (ref 3.5–5.1)

## 2012-09-12 LAB — URINALYSIS
Ketones, ur: NEGATIVE
Specific Gravity, Urine: 1.01 (ref 1.000–1.030)
Urine Glucose: NEGATIVE
Urobilinogen, UA: 0.2 (ref 0.0–1.0)

## 2012-09-14 ENCOUNTER — Encounter: Payer: Self-pay | Admitting: Pulmonary Disease

## 2012-09-14 ENCOUNTER — Ambulatory Visit (INDEPENDENT_AMBULATORY_CARE_PROVIDER_SITE_OTHER)
Admission: RE | Admit: 2012-09-14 | Discharge: 2012-09-14 | Disposition: A | Discharge status: Home / Self Care | Payer: BC Managed Care – PPO | Source: Ambulatory Visit | Attending: Pulmonary Disease | Admitting: Pulmonary Disease

## 2012-09-14 ENCOUNTER — Ambulatory Visit (INDEPENDENT_AMBULATORY_CARE_PROVIDER_SITE_OTHER): Payer: BC Managed Care – PPO | Admitting: Pulmonary Disease

## 2012-09-14 VITALS — BP 118/72 | HR 59 | Temp 97.9°F | Ht 69.0 in | Wt 194.6 lb

## 2012-09-14 DIAGNOSIS — M1990 Unspecified osteoarthritis, unspecified site: Secondary | ICD-10-CM

## 2012-09-14 DIAGNOSIS — K219 Gastro-esophageal reflux disease without esophagitis: Secondary | ICD-10-CM

## 2012-09-14 DIAGNOSIS — I10 Essential (primary) hypertension: Secondary | ICD-10-CM

## 2012-09-14 DIAGNOSIS — K589 Irritable bowel syndrome without diarrhea: Secondary | ICD-10-CM

## 2012-09-14 DIAGNOSIS — I2510 Atherosclerotic heart disease of native coronary artery without angina pectoris: Secondary | ICD-10-CM

## 2012-09-14 DIAGNOSIS — E785 Hyperlipidemia, unspecified: Secondary | ICD-10-CM

## 2012-09-14 DIAGNOSIS — Z0000 Encounter for general adult medical examination without abnormal findings: Secondary | ICD-10-CM

## 2012-09-14 MED ORDER — CLOTRIMAZOLE 1 % EX CREA: TOPICAL_CREAM | Freq: Two times a day (BID) | CUTANEOUS | Status: DC

## 2012-09-14 MED ORDER — ESOMEPRAZOLE MAGNESIUM 40 MG PO CPDR: 40.0000 mg | DELAYED_RELEASE_CAPSULE | Freq: Every day | ORAL | Status: DC

## 2012-09-14 MED ORDER — RAMIPRIL 10 MG PO CAPS: 10.0000 mg | ORAL_CAPSULE | Freq: Every day | ORAL | Status: DC

## 2012-09-14 MED ORDER — ATORVASTATIN CALCIUM 20 MG PO TABS: 20.0000 mg | ORAL_TABLET | Freq: Every day | ORAL | Status: DC

## 2012-09-14 MED ORDER — METOPROLOL TARTRATE 50 MG PO TABS: ORAL_TABLET | ORAL | Status: DC

## 2012-09-14 NOTE — Progress Notes (Signed)
Subjective:     Patient ID: Brian Todd, male   DOB: February 05, 1946, 67 y.o.   MRN: [id number]  HPI 67 y/o WM here for a follow up visit... he has HBP, CAD w/ prev stent, Hyperchol... stable on meds- no new complaints or concerns...   ~  September 11, 2010:  here for CPX> doing well w/o new complaints or concerns... unfortunately wt is up further & we discussed diet & exercise program... denies CP, palpit, SOB, edema, etc...  BP controlled on BBLocker/ ACE;  Chol looks good on Statin but incr TG noted w/ incr wt & we discussed this;  GERD controlled w/ PPI;  PSA remains normal;  he had Flu shot last fall, OK TDAP today...  ~  September 08, 2011:  Yearly ROV & CPX> [name] has had a good year, no new complaints or concerns;  He is exercising on treadmill regularly, & has lost 5# down to 207# today;  BP well controlled & denies CP, palpit, SOB, edema, etc;  FLP looks good on Lip20 + MegaRed w/ Krill Oil... SEE PROBLEM LIST BELOW>> CXR 2/13 showed heart at upper lim normal for size, clear lungs, NAD.Marland KitchenMarland Kitchen EKG 2/13 showed SBrady, rate54, WNL/ NAD... LABS 2/13 showed FLP- at goals on Lip20;  Chems- wnl;  CBC' wnl;  TSH=1.69;  PSA=1.69,  UA- clear...  ~  September 14, 2012:  Yearly check up & CPX> [name] reports another good yr, no new complaints or concerns; We reviewed the following medical problems during today's office visit >>     HBP> on Metoprolol50-1/2Bid, Altace10; BP= 118/72 & he denies CP, palpit, SOB, edema...     CAD> on ASA81 & NTG prn; hx non-Q MI 2002 w/ 95%mid-LAD s/p PTCA/stent; Myoview 2004 was neg- w/o ischemia or infarct & EF=59%, baseline EKG is wnl...    Hyperlipid> on Lip20 & KrillOil; his wt is down 12# on diet & exercise (silver sneakers); FLP shows TChol 127, TG 119, HDL 37, LDL 66    GI- GERD, IBS, GB> on Nexium40; he denies abd pain, n/v, c/d, blood seen; last colon 11/07 was wnl, f/u 33yrs; Hx GB sludge on sonar 1997, no known stones...    HxProstatitis> this occurred 2009 w/ sl evel  PSA which ret to norm on antibiotic rx; PSA 2/14 = 1.71    DJD> mild disease, uses OTC analgesics as needed... We reviewed prob list, meds, xrays and labs> see below for updates >> he had the 2013 Flu vaccine 10/13 & his last Td 2/12; requests refill meds for 90d supplies... CXR 2/14 showed normal heart size, clear lungs, NAD.Marland KitchenMarland Kitchen EKG 2/14 showed SBrady, rate57, WNL, NAD... LABS 2/14:  FLP- at goals on Lip20;  Chems- wnl;  CBC- wnl;  TSH=1.91;  PSA=1.71;  UA=clear...         PROBLEM LIST:    HEARING LOSS, MILD (ICD-389.9) - eval by DrKraus in 2005...  HYPERTENSION (ICD-401.9) - on Rx w/ METOPROLOL 50mg - 1/2tab Bid, ALTACE 10mg /d...  ~  2/12:  BP= 132/80 and similar at home; weight up to 212# as noted; denies HA, fatigue, visual changes, CP, palipit, dizziness, syncope, dyspnea, edema, etc> we discussed diet + exercise program. ~  2/13:  BP= 120/76 & wt down to 207#; he remains essent asymptomatic 7 is exercising regularly on treadmill etc... ~  2/14: on Metoprolol50-1/2Bid, Altace10; BP= 118/72 & he denies CP, palpit, SOB, edema.  CORONARY ARTERY DISEASE (ICD-414.00) - on above + ASA 81mg /d; he has never had to take  any of his NTG... he had a nonQ-MI 5/02 with cath showing 95% stenosis of mid LAD- s/p PTCA/ stent... otherw he had some non-obstructive dis... denies CP, palpit, SOB, dizzy, edema, etc... ~  last NuclearStressTest 3/04 showed no ischemia or infarct & EF=59%... ~  2/13: CXR showed heart at upper lim normal for size, clear lungs, NAD; EKG showed SBrady, rate54, WNL/ NAD... ~  2/14: CXR showed normal heart size, clear lungs, NAD; EKG showed SBrady, rate57, WNL, NAD  HYPERLIPIDEMIA (ICD-272.4) - on LIPITOR 20mg /d, +Fish Oil 1000mg Bid... ~  FLP 4/09 showed TChol 139, TG 174, HDL 33, LDL 71... rec same meds + better diet/ get wt down. ~  FLP 10/09 showed TChol 102, TG 70, HDL 40, LDL 48... GREAT! same Rx... ~  FLP 9/10 showed TChol 143, TG 112, HDL 36, LDL 84 ~  FLP 2/12 showed TChol  150, TG 225, HDL 36, LDL 77... rising TG w/ incr wt> needs better diet. ~  FLP 2/13 on Lip20 showed TChol 149, TG 118, HDL 39, LDL 86... Need to be better, discussed diet & wt reduction. ~  FLP 2/14 on Lip20 showed TChol 127, TG 119, HDL 37, LDL 66... His wt is down 12# on diet...  GERD (ICD-530.81) - on NEXIUM 40mg /d... hx PUD w/ DU 1997... ~  last EGD 12/97 by DrKaplan showed mult post-bulbar ulcers...   IRRITABLE BOWEL SYNDROME (ICD-564.1) -prev on Levsin as needed... ~  last colonoscopy 11/07 was WNL.Marland Kitchen. repeat planned 3yrs...  Hx of GALLBLADDER DISEASE (ICD-575.9) - GB sludge on sonar 1997... s/p ERCP by DrKaplan which was neg x for post bulbar ulcers seen...   Hx of PSA, INCREASED (ICD-790.93) - PSA was 5.00 in 2009 assoc w/ mild prostatitis infection... treated w/ antibiotics and repeat PSA= 2.70 in May09... ~  last labs 9/10 showed PSA= 1.02 ~  labs 2/12 showed PSA= 1.25 ~  Labs 2/13 showed PSA= 1.69 ~  Labs 2/14 showed PSA= 1.71  DEGENERATIVE JOINT DISEASE (ICD-715.90) - c/o mild DJD in hands etc... OTC Rx Prn.   Past Surgical History  Procedure Laterality Date  . Tonsillectomy and adenoidectomy      in the 2nd grade    Outpatient Encounter Prescriptions as of 09/14/2012  Medication Sig Dispense Refill  . aspirin 81 MG tablet Take 81 mg by mouth daily.      Marland Kitchen atorvastatin (LIPITOR) 20 MG tablet Take 1 tablet (20 mg total) by mouth daily.  90 tablet  3  . KRILL OIL 1000 MG CAPS Take 1 capsule by mouth daily.      . metoprolol (LOPRESSOR) 50 MG tablet Take 1/2 tablet by mouth two times daily  90 tablet  3  . Multiple Vitamins-Minerals (MULTIVITAMIN & MINERAL PO) Take 1 tablet by mouth daily.      Marland Kitchen NEXIUM 40 MG capsule TAKE 1 CAPSULE BY MOUTH DAILY  90 capsule  2  . nitroGLYCERIN (NITROSTAT) 0.4 MG SL tablet Place 1 tablet (0.4 mg total) under the tongue every 5 (five) minutes as needed.  30 tablet  6  . ramipril (ALTACE) 10 MG capsule Take 1 capsule (10 mg total) by mouth  daily.  90 capsule  3   No facility-administered encounter medications on file as of 09/14/2012.    No Known Allergies   Current Medications, Allergies, Past Medical History, Past Surgical History, Family History, and Social History were reviewed in [employer] record.    Review of Systems  The patient complains of gas/bloating, joint pain, and muscle cramps.  The patient denies fever, chills, sweats, anorexia, fatigue, weakness, malaise, weight loss, sleep disorder, blurring, diplopia, eye irritation, eye discharge, vision loss, eye pain, photophobia, earache, ear discharge, tinnitus, decreased hearing, nasal congestion, nosebleeds, sore throat, hoarseness, chest pain, palpitations, syncope, dyspnea on exertion, orthopnea, PND, peripheral edema, cough, dyspnea at rest, excessive sputum, hemoptysis, wheezing, pleurisy, nausea, vomiting, diarrhea, constipation, change in bowel habits, abdominal pain, melena, hematochezia, jaundice, indigestion/heartburn, dysphagia, odynophagia, dysuria, hematuria, urinary frequency, urinary hesitancy, nocturia, incontinence, back pain, joint swelling, muscle weakness, stiffness, arthritis, sciatica, restless legs, leg pain at night, leg pain with exertion, rash, itching, dryness, suspicious lesions, paralysis, paresthesias, seizures, tremors, vertigo, transient blindness, frequent falls, frequent headaches, difficulty walking, depression, anxiety, memory loss, confusion, cold intolerance, heat intolerance, polydipsia, polyphagia, polyuria, unusual weight change, abnormal bruising, bleeding, enlarged lymph nodes, urticaria, allergic rash, hay fever, and recurrent infections.    Objective:   Physical Exam     WD, WN, 66 WM in NAD... GENERAL:  Alert & oriented; pleasant & cooperative. HEENT:  Hailey/AT, EOM-wnl, PERRLA, Fundi-benign, EACs-clear, TMs-wnl, NOSE-clear, THROAT-clear & wnl. NECK:  Supple w/ full ROM; no JVD; normal carotid  impulses w/o bruits; no thyromegaly or nodules palpated; no lymphadenopathy. CHEST:  Clear to P & A; without wheezes/ rales/ or rhonchi. HEART:  Regular Rhythm; without murmurs/ rubs/ or gallops. ABDOMEN:  Soft & nontender; normal bowel sounds; no organomegaly or masses detected. EXT: without deformities or arthritic changes; no varicose veins/ venous insuffic/ or edema. NEURO:  CN's intact;  no focal neuro deficits... DERM:  No lesions noted; no rash etc...  RADIOLOGY DATA:  Reviewed in the EPIC EMR & discussed w/ the patient...  LABORATORY DATA:  Reviewed in the EPIC EMR & discussed w/ the patient...   Assessment:      HBP>  Controlled on BBlocker, ACE; tol well; continue same w/ better diet & get wt down...  CAD>  S/p PTCA & stent in 2002; he had nonobstructive dis elsewhere; ?Cards has released him from further follow up?  Hyperlipid>  Improved & at goals on diet + Lip20; continue same + wt reduction...  GI> GERD, IBS, Hx GB sludge>  On Nexium40 & doing satis; up to date on colon screening; LFTs remain wnl...  DJD>  Mild diffuse generalized osteoarthritis; he uses OTC meds as needed..     Plan:     Patient's Medications  New Prescriptions   CLOTRIMAZOLE (LOTRIMIN) 1 % CREAM    Apply topically 2 (two) times daily.  Previous Medications   ASPIRIN 81 MG TABLET    Take 81 mg by mouth daily.   KRILL OIL 1000 MG CAPS    Take 1 capsule by mouth daily.   MULTIPLE VITAMINS-MINERALS (MULTIVITAMIN & MINERAL PO)    Take 1 tablet by mouth daily.   NITROGLYCERIN (NITROSTAT) 0.4 MG SL TABLET    Place 1 tablet (0.4 mg total) under the tongue every 5 (five) minutes as needed.  Modified Medications   Modified Medication Previous Medication   ATORVASTATIN (LIPITOR) 20 MG TABLET atorvastatin (LIPITOR) 20 MG tablet      Take 1 tablet (20 mg total) by mouth daily.    Take 1 tablet (20 mg total) by mouth daily.   ESOMEPRAZOLE (NEXIUM) 40 MG CAPSULE NEXIUM 40 MG capsule      Take 1 capsule (40  mg total) by mouth daily before breakfast.    TAKE 1 CAPSULE BY MOUTH DAILY  METOPROLOL (LOPRESSOR) 50 MG TABLET metoprolol (LOPRESSOR) 50 MG tablet      Take 1/2 tablet by mouth two times daily    Take 1/2 tablet by mouth two times daily   RAMIPRIL (ALTACE) 10 MG CAPSULE ramipril (ALTACE) 10 MG capsule      Take 1 capsule (10 mg total) by mouth daily.    Take 1 capsule (10 mg total) by mouth daily.  Discontinued Medications   No medications on file

## 2012-09-14 NOTE — Patient Instructions (Addendum)
Today we updated your med list in our EPIC system...    Continue your current medications the same...    We refilled your meds per request...  We wrote a new prescription for Clotrimazole cream 1%- apply to feet twice daily...  We reviewed your recent fasting blood work & did a follow up CXR & EKG today...    We will contact you w/ these results when avail...  Call for any questions or if we can be of service in any way...  Let's continue our yearly check ups but call any time for problems.Marland KitchenMarland Kitchen

## 2012-09-19 ENCOUNTER — Other Ambulatory Visit: Payer: Self-pay | Admitting: Pulmonary Disease

## 2012-10-10 ENCOUNTER — Encounter: Payer: Self-pay | Admitting: *Deleted

## 2012-10-11 ENCOUNTER — Other Ambulatory Visit: Payer: Self-pay | Admitting: Pulmonary Disease

## 2012-10-31 ENCOUNTER — Other Ambulatory Visit: Payer: Self-pay | Admitting: Pulmonary Disease

## 2013-01-14 ENCOUNTER — Telehealth: Payer: Self-pay | Admitting: Pulmonary Disease

## 2013-01-16 NOTE — Telephone Encounter (Signed)
Pt was given Lortimin cream to use for his foot. This is not working.  His symptoms are worse. Dr. Kriste Basque, pls advise on alternative tx.   nkda - verified with pt  [facility]  ** Pt ok with call back tomorrow.

## 2013-01-17 MED ORDER — TERBINAFINE HCL 1 % EX CREA: TOPICAL_CREAM | Freq: Two times a day (BID) | CUTANEOUS | Status: DC

## 2013-01-17 NOTE — Telephone Encounter (Signed)
ATC patient, no answer [id number] 

## 2013-01-17 NOTE — Telephone Encounter (Signed)
Spoke with patient made him aware of recs. Rx has been sent to verified pharmacy and nothing further needed at this time

## 2013-01-17 NOTE — Telephone Encounter (Signed)
Per SN---   Try lamisil cream  1 %   Apply bid.  If not improving or resolved then will need referral to podiatrist.  thanks

## 2013-01-22 ENCOUNTER — Telehealth: Payer: Self-pay | Admitting: Pulmonary Disease

## 2013-01-22 MED ORDER — HYDROXYZINE HCL 25 MG PO TABS: 25.0000 mg | ORAL_TABLET | Freq: Three times a day (TID) | ORAL | Status: DC | PRN

## 2013-01-22 NOTE — Telephone Encounter (Signed)
Pt c/o itching on palms of hands and other parts of body for 3 days.  Denies rash.  Only change in medication was he started using Neosporin oint on a blister on his bottom 4 days ago.  Pt denies reaction to Neosporin in the past.  Pt also using Lamisil crm on foot for past week.  Please advise. No Known Allergies

## 2013-01-22 NOTE — Telephone Encounter (Signed)
Per SN---  Call in atarax 25 mg  #50  1 po every 4 hours as needed for itching ans stay out of the sun for now.   i have called and spoke with pt and he is aware of SN recs and to stay out fo the sun for now.  Pt voiced his understanding of these recs and will call back for any other concerns.

## 2013-01-28 ENCOUNTER — Ambulatory Visit (INDEPENDENT_AMBULATORY_CARE_PROVIDER_SITE_OTHER): Payer: Medicare Other | Admitting: Pulmonary Disease

## 2013-01-28 ENCOUNTER — Encounter: Payer: Self-pay | Admitting: Pulmonary Disease

## 2013-01-28 VITALS — BP 134/76 | HR 53 | Temp 97.3°F | Ht 69.0 in | Wt 202.8 lb

## 2013-01-28 DIAGNOSIS — I2510 Atherosclerotic heart disease of native coronary artery without angina pectoris: Secondary | ICD-10-CM

## 2013-01-28 DIAGNOSIS — B353 Tinea pedis: Secondary | ICD-10-CM

## 2013-01-28 DIAGNOSIS — L259 Unspecified contact dermatitis, unspecified cause: Secondary | ICD-10-CM | POA: Insufficient documentation

## 2013-01-28 DIAGNOSIS — I10 Essential (primary) hypertension: Secondary | ICD-10-CM

## 2013-01-28 DIAGNOSIS — E785 Hyperlipidemia, unspecified: Secondary | ICD-10-CM

## 2013-01-28 MED ORDER — METHYLPREDNISOLONE ACETATE 80 MG/ML IJ SUSP
80.0000 mg | Freq: Once | INTRAMUSCULAR | Status: AC
  Administered 2013-01-28: 80 mg via INTRAMUSCULAR

## 2013-01-28 MED ORDER — FLUOCINONIDE-E 0.05 % EX CREA: TOPICAL_CREAM | Freq: Two times a day (BID) | CUTANEOUS | Status: DC

## 2013-01-28 MED ORDER — PREDNISONE (PAK) 5 MG PO TABS: ORAL_TABLET | ORAL | Status: DC

## 2013-01-28 MED ORDER — TERBINAFINE HCL 250 MG PO TABS: 250.0000 mg | ORAL_TABLET | Freq: Every day | ORAL | Status: DC

## 2013-01-28 NOTE — Patient Instructions (Addendum)
Today we updated your med list in our EPIC system...    Continue your current medications the same...  Brian Todd> it looks like athlete's foot on your right foot & prob poison ivy on your hands...    Let's treat the foot w/ systemic Lamisil tabs 250mg - one tab daily for 2 weeks...    For the hands we gave you a Depo shot & Pred dosepak, plus a new cream (generic Lidex-E) to apply as needed...  The next step is to get you to a Dermatologist if this isn't resolving, so let me know how you are doing on the Rx.Marland KitchenMarland Kitchen

## 2013-01-28 NOTE — Progress Notes (Signed)
Subjective:     Patient ID: Brian Todd, male   DOB: 06-Jul-1946, 67 y.o.   MRN: [id number]  HPI 67 y/o WM here for a follow up visit... he has HBP, CAD w/ prev stent, Hyperchol... stable on meds- no new complaints or concerns...   ~  September 11, 2010:  here for CPX> doing well w/o new complaints or concerns... unfortunately wt is up further & we discussed diet & exercise program... denies CP, palpit, SOB, edema, etc...  BP controlled on BBLocker/ ACE;  Chol looks good on Statin but incr TG noted w/ incr wt & we discussed this;  GERD controlled w/ PPI;  PSA remains normal;  he had Flu shot last fall, OK TDAP today...  ~  September 08, 2011:  Yearly ROV & CPX> Brian Todd has had a good year, no new complaints or concerns;  He is exercising on treadmill regularly, & has lost 5# down to 207# today;  BP well controlled & denies CP, palpit, SOB, edema, etc;  FLP looks good on Lip20 + MegaRed w/ Krill Oil... SEE PROBLEM LIST BELOW>> CXR 2/13 showed heart at upper lim normal for size, clear lungs, NAD.Brian KitchenMarland Todd EKG 2/13 showed SBrady, rate54, WNL/ NAD... LABS 2/13 showed FLP- at goals on Lip20;  Chems- wnl;  CBC' wnl;  TSH=1.69;  PSA=1.69,  UA- clear...  ~  September 14, 2012:  Yearly check up & CPX> Brian Todd reports another good yr, no new complaints or concerns; We reviewed the following medical problems during today's office visit >>     HBP> on Metoprolol50-1/2Bid, Altace10; BP= 118/72 & he denies CP, palpit, SOB, edema...     CAD> on ASA81 & NTG prn; hx non-Q MI 2002 w/ 95%mid-LAD s/p PTCA/stent; Myoview 2004 was neg- w/o ischemia or infarct & EF=59%, baseline EKG is wnl...    Hyperlipid> on Lip20 & KrillOil; his wt is down 12# on diet & exercise (silver sneakers); FLP shows TChol 127, TG 119, HDL 37, LDL 66    GI- GERD, IBS, GB> on Nexium40; he denies abd pain, n/v, c/d, blood seen; last colon 11/07 was wnl, f/u 16yrs; Hx GB sludge on sonar 1997, no known stones...    HxProstatitis> this occurred 2009 w/ sl evel  PSA which ret to norm on antibiotic rx; PSA 2/14 = 1.71    DJD> mild disease, uses OTC analgesics as needed... We reviewed prob list, meds, xrays and labs> see below for updates >> he had the 2013 Flu vaccine 10/13 & his last Td 2/12; requests refill meds for 90d supplies... CXR 2/14 showed normal heart size, clear lungs, NAD.Brian KitchenMarland Todd EKG 2/14 showed SBrady, rate57, WNL, NAD... LABS 2/14:  FLP- at goals on Lip20;  Chems- wnl;  CBC- wnl;  TSH=1.91;  PSA=1.71;  UA=clear...   ~  January 28, 2013:  4-52mo ROV & add-on appt for rash> Brian Todd has 2 diff rashes:  1) Athlete's foot rash on right foot for which we tried Clotrimazole cream several months ago but has not responded; he called & we recently switched to Lamisil cream 1% OTC cream given by pharm & he notes sl better- we decided to cover w/ Lamisil 250mg  po Qd x 14d & if not responding=> to derm... 2) contact dermatitis on hands c/w poison ivy & we discussed Depo80, Dosepak, Lidex-E cream for that...     We reviewed prob list, meds, xrays and labs> see below for updates >>            PROBLEM LIST:  HEARING LOSS, MILD (ICD-389.9) - eval by DrKraus in 2005...  HYPERTENSION (ICD-401.9) - on Rx w/ METOPROLOL 50mg - 1/2tab Bid, ALTACE 10mg /d...  ~  2/12:  BP= 132/80 and similar at home; weight up to 212# as noted; denies HA, fatigue, visual changes, CP, palipit, dizziness, syncope, dyspnea, edema, etc> we discussed diet + exercise program. ~  2/13:  BP= 120/76 & wt down to 207#; he remains essent asymptomatic 7 is exercising regularly on treadmill etc... ~  2/14: on Metoprolol50-1/2Bid, Altace10; BP= 118/72 & he denies CP, palpit, SOB, edema.  CORONARY ARTERY DISEASE (ICD-414.00) - on above + ASA 81mg /d; he has never had to take any of his NTG... he had a nonQ-MI 5/02 with cath showing 95% stenosis of mid LAD- s/p PTCA/ stent... otherw he had some non-obstructive dis... denies CP, palpit, SOB, dizzy, edema, etc... ~  last NuclearStressTest 3/04 showed no  ischemia or infarct & EF=59%... ~  2/13: CXR showed heart at upper lim normal for size, clear lungs, NAD; EKG showed SBrady, rate54, WNL/ NAD... ~  2/14: CXR showed normal heart size, clear lungs, NAD; EKG showed SBrady, rate57, WNL, NAD  HYPERLIPIDEMIA (ICD-272.4) - on LIPITOR 20mg /d, +Fish Oil 1000mg Bid... ~  FLP 4/09 showed TChol 139, TG 174, HDL 33, LDL 71... rec same meds + better diet/ get wt down. ~  FLP 10/09 showed TChol 102, TG 70, HDL 40, LDL 48... GREAT! same Rx... ~  FLP 9/10 showed TChol 143, TG 112, HDL 36, LDL 84 ~  FLP 2/12 showed TChol 150, TG 225, HDL 36, LDL 77... rising TG w/ incr wt> needs better diet. ~  FLP 2/13 on Lip20 showed TChol 149, TG 118, HDL 39, LDL 86... Need to be better, discussed diet & wt reduction. ~  FLP 2/14 on Lip20 showed TChol 127, TG 119, HDL 37, LDL 66... His wt is down 12# on diet...  GERD (ICD-530.81) - on NEXIUM 40mg /d... hx PUD w/ DU 1997... ~  last EGD 12/97 by DrKaplan showed mult post-bulbar ulcers...   IRRITABLE BOWEL SYNDROME (ICD-564.1) -prev on Levsin as needed... ~  last colonoscopy 11/07 was WNL.Brian Todd. repeat planned 10yrs...  Hx of GALLBLADDER DISEASE (ICD-575.9) - GB sludge on sonar 1997... s/p ERCP by DrKaplan which was neg x for post bulbar ulcers seen...   Hx of PSA, INCREASED (ICD-790.93) - PSA was 5.00 in 2009 assoc w/ mild prostatitis infection... treated w/ antibiotics and repeat PSA= 2.70 in May09... ~  last labs 9/10 showed PSA= 1.02 ~  labs 2/12 showed PSA= 1.25 ~  Labs 2/13 showed PSA= 1.69 ~  Labs 2/14 showed PSA= 1.71  DEGENERATIVE JOINT DISEASE (ICD-715.90) - c/o mild DJD in hands etc... OTC Rx Prn.  DERM >>  ~  7/14: he presented w/ 2 rashes> athletes foot rash & poison ivy on hands; treated w/ Lamisil & Depo/ Pred/ Lidex-E respectively...   Past Surgical History  Procedure Laterality Date  . Tonsillectomy and adenoidectomy      in the 2nd grade    Outpatient Encounter Prescriptions as of 01/28/2013   Medication Sig Dispense Refill  . aspirin 81 MG tablet Take 81 mg by mouth daily.      Brian Todd atorvastatin (LIPITOR) 20 MG tablet TAKE 1 TABLET BY MOUTH DAILY  90 tablet  3  . esomeprazole (NEXIUM) 40 MG capsule Take 1 capsule (40 mg total) by mouth daily before breakfast.  90 capsule  3  . hydrOXYzine (ATARAX/VISTARIL) 25 MG tablet Take 1 tablet (25 mg total)  by mouth 3 (three) times daily as needed for itching.  50 tablet  1  . KRILL OIL 1000 MG CAPS Take 1 capsule by mouth daily.      . metoprolol (LOPRESSOR) 50 MG tablet TAKE 1/2 TABLET BY MOUTH 2 TIMES DAILY  90 tablet  3  . Multiple Vitamins-Minerals (MULTIVITAMIN & MINERAL PO) Take 1 tablet by mouth daily.      . nitroGLYCERIN (NITROSTAT) 0.4 MG SL tablet Place 1 tablet (0.4 mg total) under the tongue every 5 (five) minutes as needed.  30 tablet  6  . ramipril (ALTACE) 10 MG capsule TAKE 1 CAPSULE BY MOUTH DAILY  90 capsule  2  . terbinafine (LAMISIL) 1 % cream Apply topically 2 (two) times daily.  30 g  0  . [DISCONTINUED] clotrimazole (LOTRIMIN) 1 % cream Apply topically 2 (two) times daily.  30 g  6   No facility-administered encounter medications on file as of 01/28/2013.    No Known Allergies   Current Medications, Allergies, Past Medical History, Past Surgical History, Family History, and Social History were reviewed in [employer] record.    Review of Systems        The patient complains of gas/bloating, joint pain, and muscle cramps.  The patient denies fever, chills, sweats, anorexia, fatigue, weakness, malaise, weight loss, sleep disorder, blurring, diplopia, eye irritation, eye discharge, vision loss, eye pain, photophobia, earache, ear discharge, tinnitus, decreased hearing, nasal congestion, nosebleeds, sore throat, hoarseness, chest pain, palpitations, syncope, dyspnea on exertion, orthopnea, PND, peripheral edema, cough, dyspnea at rest, excessive sputum, hemoptysis, wheezing, pleurisy, nausea,  vomiting, diarrhea, constipation, change in bowel habits, abdominal pain, melena, hematochezia, jaundice, indigestion/heartburn, dysphagia, odynophagia, dysuria, hematuria, urinary frequency, urinary hesitancy, nocturia, incontinence, back pain, joint swelling, muscle weakness, stiffness, arthritis, sciatica, restless legs, leg pain at night, leg pain with exertion, rash, itching, dryness, suspicious lesions, paralysis, paresthesias, seizures, tremors, vertigo, transient blindness, frequent falls, frequent headaches, difficulty walking, depression, anxiety, memory loss, confusion, cold intolerance, heat intolerance, polydipsia, polyphagia, polyuria, unusual weight change, abnormal bruising, bleeding, enlarged lymph nodes, urticaria, allergic rash, hay fever, and recurrent infections.    Objective:   Physical Exam     WD, WN, 67 WM in NAD... GENERAL:  Alert & oriented; pleasant & cooperative. HEENT:  [place]/AT, EOM-wnl, PERRLA, Fundi-benign, EACs-clear, TMs-wnl, NOSE-clear, THROAT-clear & wnl. NECK:  Supple w/ full ROM; no JVD; normal carotid impulses w/o bruits; no thyromegaly or nodules palpated; no lymphadenopathy. CHEST:  Clear to P & A; without wheezes/ rales/ or rhonchi. HEART:  Regular Rhythm; without murmurs/ rubs/ or gallops. ABDOMEN:  Soft & nontender; normal bowel sounds; no organomegaly or masses detected. EXT: without deformities or arthritic changes; no varicose veins/ venous insuffic/ or edema. NEURO:  CN's intact;  no focal neuro deficits... DERM:  No lesions noted; no rash etc...  RADIOLOGY DATA:  Reviewed in the EPIC EMR & discussed w/ the patient...  LABORATORY DATA:  Reviewed in the EPIC EMR & discussed w/ the patient...   Assessment:    RASH>> Athletes foot & poison ivy... Treated w/ Lamisil & Depo/Pred/ Lidex-E...  HBP>  Controlled on BBlocker, ACE; tol well; continue same w/ better diet & get wt down...  CAD>  S/p PTCA & stent in 2002; he had nonobstructive dis elsewhere;  ?Cards has released him from further follow up?  Hyperlipid>  Improved & at goals on diet + Lip20; continue same + wt reduction...  GI> GERD, IBS, Hx GB sludge>  On  Nexium40 & doing satis; up to date on colon screening; LFTs remain wnl...  DJD>  Mild diffuse generalized osteoarthritis; he uses OTC meds as needed..     Plan:     Patient's Medications  New Prescriptions   FLUOCINONIDE-EMOLLIENT (LIDEX-E) 0.05 % CREAM    Apply topically 2 (two) times daily.   PREDNISONE (STERAPRED UNI-PAK) 5 MG TABS    Please give a 6 day pack. Take as directed   TERBINAFINE (LAMISIL) 250 MG TABLET    Take 1 tablet (250 mg total) by mouth daily.  Previous Medications   ASPIRIN 81 MG TABLET    Take 81 mg by mouth daily.   ATORVASTATIN (LIPITOR) 20 MG TABLET    TAKE 1 TABLET BY MOUTH DAILY   ESOMEPRAZOLE (NEXIUM) 40 MG CAPSULE    Take 1 capsule (40 mg total) by mouth daily before breakfast.   HYDROXYZINE (ATARAX/VISTARIL) 25 MG TABLET    Take 1 tablet (25 mg total) by mouth 3 (three) times daily as needed for itching.   KRILL OIL 1000 MG CAPS    Take 1 capsule by mouth daily.   METOPROLOL (LOPRESSOR) 50 MG TABLET    TAKE 1/2 TABLET BY MOUTH 2 TIMES DAILY   MULTIPLE VITAMINS-MINERALS (MULTIVITAMIN & MINERAL PO)    Take 1 tablet by mouth daily.   NITROGLYCERIN (NITROSTAT) 0.4 MG SL TABLET    Place 1 tablet (0.4 mg total) under the tongue every 5 (five) minutes as needed.   RAMIPRIL (ALTACE) 10 MG CAPSULE    TAKE 1 CAPSULE BY MOUTH DAILY  Modified Medications   No medications on file  Discontinued Medications   CLOTRIMAZOLE (LOTRIMIN) 1 % CREAM    Apply topically 2 (two) times daily.   TERBINAFINE (LAMISIL) 1 % CREAM    Apply topically 2 (two) times daily.

## 2013-04-10 ENCOUNTER — Ambulatory Visit (INDEPENDENT_AMBULATORY_CARE_PROVIDER_SITE_OTHER): Payer: Medicare Other

## 2013-04-10 DIAGNOSIS — Z23 Encounter for immunization: Secondary | ICD-10-CM

## 2013-06-11 ENCOUNTER — Other Ambulatory Visit: Payer: Self-pay | Admitting: Pulmonary Disease

## 2013-06-17 ENCOUNTER — Other Ambulatory Visit: Payer: Self-pay | Admitting: Pulmonary Disease

## 2013-09-16 ENCOUNTER — Encounter (INDEPENDENT_AMBULATORY_CARE_PROVIDER_SITE_OTHER): Payer: Self-pay

## 2013-09-16 ENCOUNTER — Encounter: Payer: Self-pay | Admitting: Pulmonary Disease

## 2013-09-16 ENCOUNTER — Ambulatory Visit (INDEPENDENT_AMBULATORY_CARE_PROVIDER_SITE_OTHER): Payer: Medicare HMO | Admitting: Pulmonary Disease

## 2013-09-16 ENCOUNTER — Other Ambulatory Visit (INDEPENDENT_AMBULATORY_CARE_PROVIDER_SITE_OTHER): Payer: Medicare HMO

## 2013-09-16 VITALS — BP 130/72 | HR 60 | Temp 98.2°F | Ht 68.0 in | Wt 210.2 lb

## 2013-09-16 DIAGNOSIS — R972 Elevated prostate specific antigen [PSA]: Secondary | ICD-10-CM

## 2013-09-16 DIAGNOSIS — Z0000 Encounter for general adult medical examination without abnormal findings: Secondary | ICD-10-CM

## 2013-09-16 DIAGNOSIS — K589 Irritable bowel syndrome without diarrhea: Secondary | ICD-10-CM

## 2013-09-16 DIAGNOSIS — K829 Disease of gallbladder, unspecified: Secondary | ICD-10-CM

## 2013-09-16 DIAGNOSIS — E785 Hyperlipidemia, unspecified: Secondary | ICD-10-CM

## 2013-09-16 DIAGNOSIS — I10 Essential (primary) hypertension: Secondary | ICD-10-CM

## 2013-09-16 DIAGNOSIS — K219 Gastro-esophageal reflux disease without esophagitis: Secondary | ICD-10-CM

## 2013-09-16 DIAGNOSIS — Z23 Encounter for immunization: Secondary | ICD-10-CM

## 2013-09-16 DIAGNOSIS — I2510 Atherosclerotic heart disease of native coronary artery without angina pectoris: Secondary | ICD-10-CM

## 2013-09-16 DIAGNOSIS — M1990 Unspecified osteoarthritis, unspecified site: Secondary | ICD-10-CM

## 2013-09-16 LAB — BASIC METABOLIC PANEL
BUN: 16 mg/dL (ref 6–23)
CHLORIDE: 104 meq/L (ref 96–112)
CO2: 30 meq/L (ref 19–32)
CREATININE: 1 mg/dL (ref 0.4–1.5)
Calcium: 9.6 mg/dL (ref 8.4–10.5)
GFR: 78.57 mL/min (ref >60.00)
Glucose, Bld: 99 mg/dL (ref 70–99)
Potassium: 5.4 mEq/L — ABNORMAL HIGH (ref 3.5–5.1)
SODIUM: 139 meq/L (ref 135–145)

## 2013-09-16 LAB — CBC WITH DIFFERENTIAL/PLATELET
BASOS ABS: 0 10*3/uL (ref 0.0–0.1)
BASOS PCT: 0.3 % (ref 0.0–3.0)
EOS ABS: 0.2 10*3/uL (ref 0.0–0.7)
Eosinophils Relative: 2.4 % (ref 0.0–5.0)
HCT: 47.6 % (ref 39.0–52.0)
HEMOGLOBIN: 15.9 g/dL (ref 13.0–17.0)
LYMPHS PCT: 29.3 % (ref 12.0–46.0)
Lymphs Abs: 2.2 10*3/uL (ref 0.7–4.0)
MCHC: 33.3 g/dL (ref 30.0–36.0)
MCV: 92.7 fl (ref 78.0–100.0)
MONO ABS: 0.7 10*3/uL (ref 0.1–1.0)
Monocytes Relative: 8.7 % (ref 3.0–12.0)
NEUTROS ABS: 4.5 10*3/uL (ref 1.4–7.7)
NEUTROS PCT: 59.3 % (ref 43.0–77.0)
Platelets: 231 10*3/uL (ref 150.0–400.0)
RBC: 5.14 Mil/uL (ref 4.22–5.81)
RDW: 12.9 % (ref 11.5–14.6)
WBC: 7.5 10*3/uL (ref 4.5–10.5)

## 2013-09-16 LAB — HEPATIC FUNCTION PANEL
ALK PHOS: 89 U/L (ref 39–117)
ALT: 36 U/L (ref 0–53)
AST: 28 U/L (ref 0–37)
Albumin: 4 g/dL (ref 3.5–5.2)
BILIRUBIN DIRECT: 0.1 mg/dL (ref 0.0–0.3)
BILIRUBIN TOTAL: 0.8 mg/dL (ref 0.3–1.2)
TOTAL PROTEIN: 7 g/dL (ref 6.0–8.3)

## 2013-09-16 LAB — LIPID PANEL
CHOL/HDL RATIO: 3
Cholesterol: 140 mg/dL (ref 0–200)
HDL: 42.9 mg/dL (ref >39.00)
LDL CALC: 68 mg/dL (ref 0–99)
Triglycerides: 146 mg/dL (ref 0.0–149.0)
VLDL: 29.2 mg/dL (ref 0.0–40.0)

## 2013-09-16 LAB — TSH: TSH: 2.31 u[IU]/mL (ref 0.35–5.50)

## 2013-09-16 LAB — PSA: PSA: 1.78 ng/mL (ref 0.10–4.00)

## 2013-09-16 MED ORDER — RAMIPRIL 10 MG PO CAPS: ORAL_CAPSULE | ORAL | Status: DC

## 2013-09-16 MED ORDER — NITROGLYCERIN 0.4 MG SL SUBL: SUBLINGUAL_TABLET | SUBLINGUAL | Status: DC

## 2013-09-16 MED ORDER — ATORVASTATIN CALCIUM 20 MG PO TABS: ORAL_TABLET | ORAL | Status: DC

## 2013-09-16 MED ORDER — ESOMEPRAZOLE MAGNESIUM 40 MG PO CPDR: 40.0000 mg | DELAYED_RELEASE_CAPSULE | Freq: Every day | ORAL | Status: DC

## 2013-09-16 MED ORDER — HYDROXYZINE HCL 25 MG PO TABS: 25.0000 mg | ORAL_TABLET | Freq: Three times a day (TID) | ORAL | Status: DC | PRN

## 2013-09-16 MED ORDER — FLUOCINONIDE-E 0.05 % EX CREA: TOPICAL_CREAM | Freq: Two times a day (BID) | CUTANEOUS | Status: DC

## 2013-09-16 MED ORDER — METOPROLOL TARTRATE 50 MG PO TABS: ORAL_TABLET | ORAL | Status: DC

## 2013-09-16 NOTE — Patient Instructions (Signed)
Today we updated your med list in our EPIC system...    Continue your current medications the same...    We refilled the meds you requested...  OK to use OTC Aleve as needed for arthritis sym[ptoms...  Today we did your follow up EKG and FASTING blood work...    We will contact you w/ the results when available...   Keep up the good work w/ your regular exercise program...    Let's work on weight reduction as well...  We gave you a PNEUMONIA vaccine shot today (current indication is one shot after age 66).  Call for any questions...  Let's plan a follow up visit in 20yr, sooner if needed for problems.Marland KitchenMarland Kitchen

## 2013-09-16 NOTE — Progress Notes (Signed)
Subjective:     Patient ID: Brian Todd, male   DOB: 12/29/1945, 67 y.o.   MRN: [id number]  HPI 67 y/o WM here for a follow up visit... he has HBP, CAD w/ prev stent, Hyperchol... stable on meds- no new complaints or concerns...   ~  September 08, 2011:  Yearly ROV & CPX> Brian Todd has had a good year, no new complaints or concerns;  He is exercising on treadmill regularly, & has lost 5# down to 207# today;  BP well controlled & denies CP, palpit, SOB, edema, etc;  FLP looks good on Lip20 + MegaRed w/ Krill Oil... SEE PROBLEM LIST BELOW>> CXR 2/13 showed heart at upper lim normal for size, clear lungs, NAD... EKG 2/13 showed SBrady, rate54, WNL/ NAD... LABS 2/13 showed FLP- at goals on Lip20;  Chems- wnl;  CBC' wnl;  TSH=1.69;  PSA=1.69,  UA- clear...  ~  September 14, 2012:  Yearly check up & CPX> Brian Todd reports another good yr, no new complaints or concerns; We reviewed the following medical problems during today's office visit >>     HBP> on Metoprolol50-1/2Bid, Altace10; BP= 118/72 & he denies CP, palpit, SOB, edema...     CAD> on ASA81 & NTG prn; hx non-Q MI 2002 w/ 95%mid-LAD s/p PTCA/stent; Myoview 2004 was neg- w/o ischemia or infarct & EF=59%, baseline EKG is wnl...    Hyperlipid> on Lip20 & KrillOil; his wt is down 12# on diet & exercise (silver sneakers); FLP shows TChol 127, TG 119, HDL 37, LDL 66    GI- GERD, IBS, GB> on Nexium40; he denies abd pain, n/v, c/d, blood seen; last colon 11/07 was wnl, f/u 10yrs; Hx GB sludge on sonar 1997, no known stones...    HxProstatitis> this occurred 2009 w/ sl evel PSA which ret to norm on antibiotic rx; PSA 2/14 = 1.71    DJD> mild disease, uses OTC analgesics as needed... We reviewed prob list, meds, xrays and labs> see below for updates >> he had the 2013 Flu vaccine 10/13 & his last Td 2/12; requests refill meds for 90d supplies... CXR 2/14 showed normal heart size, clear lungs, NAD... EKG 2/14 showed SBrady, rate57, WNL, NAD... LABS 2/14:  FLP- at  goals on Lip20;  Chems- wnl;  CBC- wnl;  TSH=1.91;  PSA=1.71;  UA=clear...   ~  January 28, 2013:  4-5mo ROV & add-on appt for rash> Brian Todd has 2 diff rashes:  1) Athlete's foot rash on right foot for which we tried Clotrimazole cream several months ago but has not responded; he called & we recently switched to Lamisil cream 1% OTC cream given by pharm & he notes sl better- we decided to cover w/ Lamisil 250mg po Qd x 14d & if not responding=> to derm... 2) contact dermatitis on hands c/w poison ivy & we discussed Depo80, Dosepak, Lidex-E cream for that...     We reviewed prob list, meds, xrays and labs> see below for updates >>   ~  September 16, 2013:  7-8mo ROV & yearly CPX> Brian Todd reports a good yr overall- no new complaints or concerns;  He did cut back on the Atorva20 to 1/2 tabQod for awile due to "joint pain" which improved, and now back to 20mg/d & doing better he says    HBP> on Metoprolol50-1/2Bid, Altace10; BP= 130/72 & he denies CP, palpit, SOB, edema...     CAD> on ASA81 & NTG prn; hx non-Q MI 2002 w/ 95%mid-LAD s/p PTCA/stent; Myoview 2004 was   neg- w/o ischemia or infarct & EF=59%, baseline EKG is wnl...    Hyperlipid> on [place]; his wt is up 8# despite diet & exercise; FLP 2/15 shows TChol 140, TG 146, HDL 43, LDL 68    GI- GERD, IBS, GB> on Nexium40; he denies abd pain, n/v, c/d, blood seen; last colon 11/07 was wnl, f/u 21yr; Hx GB sludge on sonar 1997, no known stones...    HxProstatitis> this occurred 2009 w/ sl evel PSA which ret to norm on antibiotic rx; PSA 2/15 = 1.78    DJD> mild disease, uses OTC analgesics as needed...    Derm- Tinea pedis resolved w/ Lamisil Rx... We reviewed prob list, meds, xrays and labs> see below for updates >> Given PNEUMOVAX today... EKG 2/15 showed SBrady, rate56, WNL, NAD...  LABS 2/15:  FLP- at goals on Lip20;  Chems- wnl;  CBC- wnl;  TSH=2.31;  PSA=1.78...           PROBLEM LIST:    HEARING LOSS, MILD (ICD-389.9) - eval by DrKraus in  2005...  HYPERTENSION (ICD-401.9) - on Rx w/ METOPROLOL 56m 1/2tab Bid, ALTACE 1041m...  ~  2/12:  BP= 132/80 and similar at home; weight up to 212# as noted; denies HA, fatigue, visual changes, CP, palipit, dizziness, syncope, dyspnea, edema, etc> we discussed diet + exercise program. ~  2/13:  BP= 120/76 & wt down to 207#; he remains essent asymptomatic 7 is exercising regularly on treadmill etc... ~  2/14: on Metoprolol50-1/2Bid, Altace10; BP= 118/72 & he denies CP, palpit, SOB, edema. ~  2/15: on Metoprolol50-1/2Bid, Altace10; BP= 130/72 & he remains asymptomatic...  CORONARY ARTERY DISEASE (ICD-414.00) - on above + ASA 59m76m he has never had to take any of his NTG... he had a nonQ-MI 5/02 with cath showing 95% stenosis of mid LAD- s/p PTCA/ stent... otherw he had some non-obstructive dis... denies CP, palpit, SOB, dizzy, edema, etc... ~  last NuclearStressTest 3/04 showed no ischemia or infarct & EF=59%... ~  2/13: CXR showed heart at upper lim normal for size, clear lungs, NAD; EKG showed SBrady, rate54, WNL/ NAD... ~  2/14: CXR showed normal heart size, clear lungs, NAD; EKG showed SBrady, rate57, WNL, NAD ~  2/15: EKG 2/15 showed SBrady, rate56, WNL, NAD...  HYPERLIPIDEMIA (ICD-272.4) - on LIPITOR 20mg29m+Fish Oil 1000mgB72m. ~  FLP 4/[place] TCh[place] 139, TG 174, HDL 33, LDL 71... rec same meds + better diet/ get wt down. ~  FLP [place] showed TChol 102, TG 70, HDL 40, LDL 48... GREAT! same Rx... ~  FLP 9/10 showed TChol 143, TG 112, HDL 36, LDL 84 ~  FLP 2/12 showed TChol 150, TG 225, HDL 36, LDL 77... rising TG w/ incr wt> needs better diet. ~  FLP 2/13 on Lip20 showed TChol 149, TG 118, HDL 39, LDL 86... Need to be better, discussed diet & wt reduction. ~  FLP 2/14 on Lip20 showed TChol 127, TG 119, HDL 37, LDL 66... His wt is down 12# on diet... ~  FLP 2/15 on Lip20 showed TChol 140, TG 146, HDL 43, LDL 68  GERD (ICD-530.81) - on NEXIUM 40mg/d13mhx PUD w/ DU 1997... ~  last  EGD 12/97 by DrKaplan showed mult post-bulbar ulcers...   IRRITABLE BOWEL SYNDROME (ICD-564.1) -prev on Levsin as needed... ~  last colonoscopy 11/07 was WNL... repeMarland Kitchent planned 31yrs..28yr of GALLBLADDER DISEASE (ICD-575.9) - GB sludge on sonar 1997... s/p ERCP by DrKaplan which was neg x for post  bulbar ulcers seen...   Hx of PSA, INCREASED (ICD-790.93) - PSA was 5.00 in 2009 assoc w/ mild prostatitis infection... treated w/ antibiotics and repeat PSA= 2.70 in May09... ~  last labs 9/10 showed PSA= 1.02 ~  labs 2/12 showed PSA= 1.25 ~  Labs 2/13 showed PSA= 1.69 ~  Labs 2/14 showed PSA= 1.71 ~  Labs 2/15 showed PSA= 1.78  DEGENERATIVE JOINT DISEASE (ICD-715.90) - c/o mild DJD in hands etc... OTC Rx Prn.  DERM >>  ~  7/14: he presented w/ 2 rashes> athletes foot rash & poison ivy on hands; treated w/ Lamisil & Depo/ Pred/ Lidex-E respectively...   Past Surgical History  Procedure Laterality Date  . Tonsillectomy and adenoidectomy      in the 2nd grade    Outpatient Encounter Prescriptions as of 09/16/2013  Medication Sig  . aspirin 81 MG tablet Take 81 mg by mouth daily.  Marland Kitchen atorvastatin (LIPITOR) 20 MG tablet TAKE 1 TABLET BY MOUTH DAILY  . esomeprazole (NEXIUM) 40 MG capsule Take 1 capsule (40 mg total) by mouth daily before breakfast.  . fluocinonide-emollient (LIDEX-E) 0.05 % cream Apply topically 2 (two) times daily.  . hydrOXYzine (ATARAX/VISTARIL) 25 MG tablet Take 1 tablet (25 mg total) by mouth 3 (three) times daily as needed for itching.  Marland Kitchen KRILL OIL 1000 MG CAPS Take 1 capsule by mouth daily.  . metoprolol (LOPRESSOR) 50 MG tablet TAKE 1/2 TABLET BY MOUTH 2 TIMES DAILY  . Multiple Vitamins-Minerals (MULTIVITAMIN & MINERAL PO) Take 1 tablet by mouth daily.  Marland Kitchen NITROSTAT 0.4 MG SL tablet PLACE 1 TABLET UNDER THE TONGUE EVERY 5 MINS AS NEEDED  . ramipril (ALTACE) 10 MG capsule TAKE 1 CAPSULE BY MOUTH DAILY  . [DISCONTINUED] NEXIUM 40 MG capsule TAKE 1 CAPSULE BY MOUTH DAILY   . [DISCONTINUED] predniSONE (STERAPRED UNI-PAK) 5 MG TABS Please give a 6 day pack. Take as directed  . [DISCONTINUED] terbinafine (LAMISIL) 250 MG tablet Take 1 tablet (250 mg total) by mouth daily.    No Known Allergies   Current Medications, Allergies, Past Medical History, Past Surgical History, Family History, and Social History were reviewed in [employer] record.    Review of Systems        The patient complains of gas/bloating, joint pain, and muscle cramps.  The patient denies fever, chills, sweats, anorexia, fatigue, weakness, malaise, weight loss, sleep disorder, blurring, diplopia, eye irritation, eye discharge, vision loss, eye pain, photophobia, earache, ear discharge, tinnitus, decreased hearing, nasal congestion, nosebleeds, sore throat, hoarseness, chest pain, palpitations, syncope, dyspnea on exertion, orthopnea, PND, peripheral edema, cough, dyspnea at rest, excessive sputum, hemoptysis, wheezing, pleurisy, nausea, vomiting, diarrhea, constipation, change in bowel habits, abdominal pain, melena, hematochezia, jaundice, indigestion/heartburn, dysphagia, odynophagia, dysuria, hematuria, urinary frequency, urinary hesitancy, nocturia, incontinence, back pain, joint swelling, muscle weakness, stiffness, arthritis, sciatica, restless legs, leg pain at night, leg pain with exertion, rash, itching, dryness, suspicious lesions, paralysis, paresthesias, seizures, tremors, vertigo, transient blindness, frequent falls, frequent headaches, difficulty walking, depression, anxiety, memory loss, confusion, cold intolerance, heat intolerance, polydipsia, polyphagia, polyuria, unusual weight change, abnormal bruising, bleeding, enlarged lymph nodes, urticaria, allergic rash, hay fever, and recurrent infections.    Objective:   Physical Exam     WD, WN, 65 WM in NAD... GENERAL:  Alert & oriented; pleasant & cooperative. HEENT:  [place]/AT, EOM-wnl, PERRLA, Fundi-benign,  EACs-clear, TMs-wnl, NOSE-clear, THROAT-clear & wnl. NECK:  Supple w/ full ROM; no JVD; normal carotid impulses w/o bruits; no thyromegaly  or nodules palpated; no lymphadenopathy. CHEST:  Clear to P & A; without wheezes/ rales/ or rhonchi. HEART:  Regular Rhythm; without murmurs/ rubs/ or gallops. ABDOMEN:  Soft & nontender; normal bowel sounds; no organomegaly or masses detected. EXT: without deformities or arthritic changes; no varicose veins/ venous insuffic/ or edema. NEURO:  CN's intact;  no focal neuro deficits... DERM:  No lesions noted; no rash etc...  RADIOLOGY DATA:  Reviewed in the EPIC EMR & discussed w/ the patient...  LABORATORY DATA:  Reviewed in the EPIC EMR & discussed w/ the patient...   Assessment:    HBP>  Controlled on BBlocker, ACE; tol well; continue same w/ better diet & get wt down...  CAD>  S/p PTCA & stent in 2002; he had nonobstructive dis elsewhere; ?Cards has released him from further follow up?  Hyperlipid>  Improved & at goals on diet + Lip20; continue same + wt reduction...  GI> GERD, IBS, Hx GB sludge>  On Nexium40 & doing satis; up to date on colon screening; LFTs remain wnl...  DJD>  Mild diffuse generalized osteoarthritis; he uses OTC meds as needed..     Plan:        

## 2013-10-04 ENCOUNTER — Other Ambulatory Visit: Payer: Self-pay | Admitting: Pulmonary Disease

## 2013-10-10 NOTE — Telephone Encounter (Signed)
Called spoke with pt. He has been giving # to [facility] to schedule appt.  Refill has been sent

## 2014-04-02 ENCOUNTER — Ambulatory Visit (INDEPENDENT_AMBULATORY_CARE_PROVIDER_SITE_OTHER): Payer: Medicare HMO | Admitting: Family Medicine

## 2014-04-02 ENCOUNTER — Encounter: Payer: Self-pay | Admitting: Family Medicine

## 2014-04-02 VITALS — BP 130/80 | HR 55 | Temp 98.0°F | Ht 68.0 in | Wt 208.0 lb

## 2014-04-02 DIAGNOSIS — I10 Essential (primary) hypertension: Secondary | ICD-10-CM

## 2014-04-02 DIAGNOSIS — K219 Gastro-esophageal reflux disease without esophagitis: Secondary | ICD-10-CM

## 2014-04-02 DIAGNOSIS — I2510 Atherosclerotic heart disease of native coronary artery without angina pectoris: Secondary | ICD-10-CM

## 2014-04-02 DIAGNOSIS — E785 Hyperlipidemia, unspecified: Secondary | ICD-10-CM

## 2014-04-02 DIAGNOSIS — R972 Elevated prostate specific antigen [PSA]: Secondary | ICD-10-CM

## 2014-04-02 NOTE — Progress Notes (Signed)
Subjective:    Patient ID: Brian Todd, male    DOB: 02-06-1946, 68 y.o.   MRN: [id number]  HPI  Here to get established from Dr Jeannine Kitten practice - had to switch providers   Lives in [place]  This is close for him   He retired recently -does some work at home  [employer] He is enjoying retirement   His goal is to loose weight  bmi is 23   He does watch his fat intake  Has hereditary high cholesterol  Lab Results  Component Value Date   CHOL 140 09/16/2013   HDL 42.90 09/16/2013   LDLCALC 68 09/16/2013   LDLDIRECT 76.5 09/02/2010   TRIG 146.0 09/16/2013   CHOLHDL 3 09/16/2013   at goal with lipitor and diet    bp is stable today  No cp or palpitations or headaches or edema  No side effects to medicines  BP Readings from Last 3 Encounters:  04/02/14 130/80  09/16/13 130/72  01/28/13 134/76    Overall stable and no problems  Takes metoprolol and altace     Chemistry      Component Value Date/Time   NA 139 09/16/2013 0957   K 5.4* 09/16/2013 0957   CL 104 09/16/2013 0957   CO2 30 09/16/2013 0957   BUN 16 09/16/2013 0957   CREATININE 1.0 09/16/2013 0957      Component Value Date/Time   CALCIUM 9.6 09/16/2013 0957   ALKPHOS 89 09/16/2013 0957   AST 28 09/16/2013 0957   ALT 36 09/16/2013 0957   BILITOT 0.8 09/16/2013 0957     no supplements and also does not eat bananas   GERD- he takes nexium- he just switched to the otc  He takes 2 of the nexium otc daily  Works well for acid reflux  Has to watch what he eats  Ate BBQ ribs and it bothers him/ as does Tea  Now he drinks water   CAD - has had a stent in the past  Cardiology - used to go to the [employer] [address] office  Told to f/u if needed  Has been stable  Takes 81 mg asa daily   Some exercise - likes to walk  Also push mows his yard    Past hx of PSA elevation -with infection -came back down  Lab Results  Component Value Date   PSA 1.78 09/16/2013   PSA 1.71 09/12/2012   PSA 1.69  09/08/2011   stabilized now   no BPH symptoms at all   Patient Active Problem List   Diagnosis Date Noted  . Contact dermatitis 01/28/2013  . Tinea pedis 01/28/2013  . Physical exam, annual 09/14/2012  . DEGENERATIVE JOINT DISEASE 07/30/2009  . PSA, INCREASED 07/30/2009  . HEARING LOSS, MILD 11/27/2007  . CORONARY ARTERY DISEASE 11/27/2007  . IRRITABLE BOWEL SYNDROME 11/27/2007  . GALLBLADDER DISEASE 11/27/2007  . HYPERLIPIDEMIA 08/13/2007  . HYPERTENSION 08/13/2007  . GERD 08/13/2007   Past Medical History  Diagnosis Date  . Hearing loss   . Hypertension   . Coronary artery disease   . Hyperlipidemia   . GERD (gastroesophageal reflux disease)   . IBS (irritable bowel syndrome)   . Gallbladder disease   . Increased prostate specific antigen (PSA) velocity   . DJD (degenerative joint disease)    Past Surgical History  Procedure Laterality Date  . Tonsillectomy and adenoidectomy      in the 2nd grade   History  Substance Use  Topics  . Smoking status: Never Smoker   . Smokeless tobacco: Never Used  . Alcohol Use: Yes     Comment: rare use   No family history on file. No Known Allergies Current Outpatient Prescriptions on File Prior to Visit  Medication Sig Dispense Refill  . aspirin 81 MG tablet Take 81 mg by mouth daily.      Marland Kitchen atorvastatin (LIPITOR) 20 MG tablet TAKE 1 TABLET BY MOUTH DAILY  90 tablet  2  . KRILL OIL 1000 MG CAPS Take 1 capsule by mouth daily.      . metoprolol (LOPRESSOR) 50 MG tablet TAKE 1/2 TABLET BY MOUTH 2 TIMES DAILY  90 tablet  2  . Multiple Vitamins-Minerals (MULTIVITAMIN & MINERAL PO) Take 1 tablet by mouth daily.      . nitroGLYCERIN (NITROSTAT) 0.4 MG SL tablet PLACE 1 TABLET UNDER THE TONGUE EVERY 5 MINS AS NEEDED  25 tablet  1  . ramipril (ALTACE) 10 MG capsule TAKE 1 CAPSULE BY MOUTH DAILY  90 capsule  2   No current facility-administered medications on file prior to visit.    Review of Systems Review of Systems  Constitutional:  Negative for fever, appetite change, fatigue and unexpected weight change.  Eyes: Negative for pain and visual disturbance.  Respiratory: Negative for cough and shortness of breath.   Cardiovascular: Negative for cp or palpitations    Gastrointestinal: Negative for nausea, diarrhea and constipation.  Genitourinary: Negative for urgency and frequency.  Skin: Negative for pallor or rash   Neurological: Negative for weakness, light-headedness, numbness and headaches.  Hematological: Negative for adenopathy. Does not bruise/bleed easily.  Psychiatric/Behavioral: Negative for dysphoric mood. The patient is not nervous/anxious.         Objective:   Physical Exam  Constitutional: He appears well-developed and well-nourished. No distress.  obese and well appearing   HENT:  Head: Normocephalic and atraumatic.  Right Ear: External ear normal.  Left Ear: External ear normal.  Nose: Nose normal.  Mouth/Throat: Oropharynx is clear and moist.  Eyes: Conjunctivae and EOM are normal. Pupils are equal, round, and reactive to light. Right eye exhibits no discharge. Left eye exhibits no discharge. No scleral icterus.  Neck: Normal range of motion. Neck supple. No JVD present. Carotid bruit is not present. No thyromegaly present.  Cardiovascular: Normal rate, regular rhythm, normal heart sounds and intact distal pulses.  Exam reveals no gallop.   Pulmonary/Chest: Effort normal and breath sounds normal. No respiratory distress. He has no wheezes. He exhibits no tenderness.  Abdominal: Soft. Bowel sounds are normal. He exhibits no distension, no abdominal bruit and no mass. There is no tenderness.  Musculoskeletal: He exhibits no edema and no tenderness.  Lymphadenopathy:    He has no cervical adenopathy.  Neurological: He is alert. He has normal reflexes. No cranial nerve deficit. He exhibits normal muscle tone. Coordination normal.  Skin: Skin is warm and dry. No rash noted. No erythema. No pallor.    Solar lentigos diffusely   Psychiatric: He has a normal mood and affect.          Assessment & Plan:   Problem List Items Addressed This Visit     Cardiovascular and Mediastinum   HYPERTENSION      On ace and beta blocker with hx of CAD but no prev MI  bp in fair control at this time  BP Readings from Last 1 Encounters:  04/02/14 130/80   No changes needed Disc lifstyle change with  low sodium diet and exercise   Enc wt loss and exercise     CORONARY ARTERY DISEASE - Primary     One stent years ago  Has been stable with med management - BP and cholesterol control No exercise intolerance       Digestive   GERD     Pt pref to take 2 of the otc nexium (20 mg) once daily then take the generic  He will continue this  Symptoms are well controlled if he eats properly  Rev gerd dietary guidelines  Wt loss would also be optimal     Relevant Medications      esomeprazole (NEXIUM) 20 MG capsule     Other   HYPERLIPIDEMIA     Disc goals for lipids and reasons to control them Rev labs with pt Rev low sat fat diet in detail CAD- disc imp of good control  On lipitor and healthy diet     PSA, INCREASED      Per pt only once in the past with some sort of inflammation or infection No hx of BPH or symptoms recently Lab Results  Component Value Date   PSA 1.78 09/16/2013   PSA 1.71 09/12/2012   PSA 1.69 09/08/2011   these have been stable

## 2014-04-02 NOTE — Progress Notes (Signed)
Pre visit review using our clinic review tool, if applicable. No additional management support is needed unless otherwise documented below in the visit note. 

## 2014-04-02 NOTE — Patient Instructions (Signed)
Flu vaccine today  If you are interested in a shingles/zoster vaccine - call your insurance to check on coverage,( you should not get it within 1 month of other vaccines) , then call us for a prescription  for it to take to a pharmacy that gives the shot , or make a nurse visit to get it here depending on your coverage Work on weight loss with lower calorie diet and also more exercise ( aim to exercise at least 30 minutes five days per week) Weight watchers program or the APP (myfitnesspal) are helpful    You are due for a physical in March

## 2014-04-03 NOTE — Assessment & Plan Note (Signed)
On ace and beta blocker with hx of CAD but no prev MI  bp in fair control at this time  BP Readings from Last 1 Encounters:  04/02/14 130/80   No changes needed Disc lifstyle change with low sodium diet and exercise   Enc wt loss and exercise

## 2014-04-03 NOTE — Assessment & Plan Note (Signed)
Disc goals for lipids and reasons to control them Rev labs with pt Rev low sat fat diet in detail CAD- disc imp of good control  On lipitor and healthy diet

## 2014-04-03 NOTE — Assessment & Plan Note (Signed)
Pt pref to take 2 of the otc nexium (20 mg) once daily then take the generic  He will continue this  Symptoms are well controlled if he eats properly  Rev gerd dietary guidelines  Wt loss would also be optimal

## 2014-04-03 NOTE — Assessment & Plan Note (Signed)
One stent years ago  Has been stable with med management - BP and cholesterol control No exercise intolerance

## 2014-04-03 NOTE — Assessment & Plan Note (Signed)
Per pt only once in the past with some sort of inflammation or infection No hx of BPH or symptoms recently Lab Results  Component Value Date   PSA 1.78 09/16/2013   PSA 1.71 09/12/2012   PSA 1.69 09/08/2011   these have been stable

## 2014-04-28 ENCOUNTER — Ambulatory Visit (INDEPENDENT_AMBULATORY_CARE_PROVIDER_SITE_OTHER): Payer: Medicare HMO | Admitting: Family Medicine

## 2014-04-28 ENCOUNTER — Encounter: Payer: Self-pay | Admitting: Family Medicine

## 2014-04-28 VITALS — BP 148/72 | HR 95 | Temp 98.8°F | Ht 68.0 in | Wt 205.5 lb

## 2014-04-28 DIAGNOSIS — R319 Hematuria, unspecified: Secondary | ICD-10-CM

## 2014-04-28 DIAGNOSIS — N390 Urinary tract infection, site not specified: Secondary | ICD-10-CM | POA: Insufficient documentation

## 2014-04-28 DIAGNOSIS — N3001 Acute cystitis with hematuria: Secondary | ICD-10-CM

## 2014-04-28 LAB — POCT URINALYSIS DIPSTICK
BILIRUBIN UA: NEGATIVE
Glucose, UA: NEGATIVE
Nitrite, UA: NEGATIVE
PH UA: 6
Spec Grav, UA: 1.025
Urobilinogen, UA: 1

## 2014-04-28 MED ORDER — CIPROFLOXACIN HCL 500 MG PO TABS: 500.0000 mg | ORAL_TABLET | Freq: Two times a day (BID) | ORAL | Status: DC

## 2014-04-28 NOTE — Progress Notes (Signed)
Pre visit review using our clinic review tool, if applicable. No additional management support is needed unless otherwise documented below in the visit note. 

## 2014-04-28 NOTE — Patient Instructions (Signed)
Drink lots of fluids- primarily water  Take the cipro as directed  We will call with a culture result   Follow up in approx 10-14 days

## 2014-04-28 NOTE — Assessment & Plan Note (Signed)
With mild hematuria/malaise  tx with cipro 500 bid for 7d cx urine  F/u in 10-14 d re check urine and check in   ? Why he has this - ? Of prostate infx in distant past

## 2014-04-28 NOTE — Progress Notes (Signed)
Subjective:    Patient ID: Brian Todd, male    DOB: 1946/04/23, 68 y.o.   MRN: [id number]  HPI Here with urinary symptoms   He thinks he has an infection  Noticed Sunday am  Worse to stand than lie down  Urgency and frequency- when he urinates - is is small volume  Also pain to urinate  Got a little better and then worse  Felt slt nausea -no vomiting  Loose stool times one  No flank pain  ? If he had a little blood in urine - was darker/orange , then did see some blood swirl in urine    Has not drank enough water  No appetite-also not eating  Did drink some cranberry juice on sat    Results for orders placed in visit on 04/28/14  POCT URINALYSIS DIPSTICK      Result Value Ref Range   Color, UA Amber     Clarity, UA Cloudy     Glucose, UA neg.     Bilirubin, UA neg.     Ketones, UA 5+     Spec Grav, UA 1.025     Blood, UA Large     pH, UA 6.0     Protein, UA 300++     Urobilinogen, UA 1.0     Nitrite, UA neg.     Leukocytes, UA large (3+)       Patient Active Problem List   Diagnosis Date Noted  . UTI (urinary tract infection) 04/28/2014  . Contact dermatitis 01/28/2013  . Tinea pedis 01/28/2013  . Physical exam, annual 09/14/2012  . DEGENERATIVE JOINT DISEASE 07/30/2009  . PSA, INCREASED 07/30/2009  . HEARING LOSS, MILD 11/27/2007  . CORONARY ARTERY DISEASE 11/27/2007  . IRRITABLE BOWEL SYNDROME 11/27/2007  . GALLBLADDER DISEASE 11/27/2007  . HYPERLIPIDEMIA 08/13/2007  . HYPERTENSION 08/13/2007  . GERD 08/13/2007   Past Medical History  Diagnosis Date  . Hearing loss   . Hypertension   . Coronary artery disease   . Hyperlipidemia   . GERD (gastroesophageal reflux disease)   . IBS (irritable bowel syndrome)   . Gallbladder disease   . Increased prostate specific antigen (PSA) velocity   . DJD (degenerative joint disease)    Past Surgical History  Procedure Laterality Date  . Tonsillectomy and adenoidectomy      in the 2nd grade   History   Substance Use Topics  . Smoking status: Never Smoker   . Smokeless tobacco: Never Used  . Alcohol Use: Yes     Comment: rare use   No family history on file. No Known Allergies Current Outpatient Prescriptions on File Prior to Visit  Medication Sig Dispense Refill  . aspirin 81 MG tablet Take 81 mg by mouth daily.      Marland Kitchen atorvastatin (LIPITOR) 20 MG tablet TAKE 1 TABLET BY MOUTH DAILY  90 tablet  2  . esomeprazole (NEXIUM) 20 MG capsule Take 40 mg by mouth daily at 12 noon. Two 20 mg tabs otc daily      . KRILL OIL 1000 MG CAPS Take 1 capsule by mouth daily.      . metoprolol (LOPRESSOR) 50 MG tablet TAKE 1/2 TABLET BY MOUTH 2 TIMES DAILY  90 tablet  2  . Multiple Vitamins-Minerals (MULTIVITAMIN & MINERAL PO) Take 1 tablet by mouth daily.      . nitroGLYCERIN (NITROSTAT) 0.4 MG SL tablet PLACE 1 TABLET UNDER THE TONGUE EVERY 5 MINS AS NEEDED  25 tablet  1  . ramipril (ALTACE) 10 MG capsule TAKE 1 CAPSULE BY MOUTH DAILY  90 capsule  2   No current facility-administered medications on file prior to visit.    Review of Systems Review of Systems  Constitutional: Negative for fever, appetite change, fatigue and unexpected weight change.  Eyes: Negative for pain and visual disturbance.  Respiratory: Negative for cough and shortness of breath.   Cardiovascular: Negative for cp or palpitations    Gastrointestinal: Negative for nausea, diarrhea and constipation.  Genitourinary: pos  for urgency and frequency. neg for flank pain  Skin: Negative for pallor or rash   Neurological: Negative for weakness, light-headedness, numbness and headaches.  Hematological: Negative for adenopathy. Does not bruise/bleed easily.  Psychiatric/Behavioral: Negative for dysphoric mood. The patient is not nervous/anxious.         Objective:   Physical Exam  Constitutional: He appears well-developed and well-nourished. No distress.  HENT:  Head: Normocephalic and atraumatic.  Mouth/Throat: Oropharynx is  clear and moist.  Eyes: Conjunctivae and EOM are normal. Pupils are equal, round, and reactive to light. No scleral icterus.  Neck: Normal range of motion. Neck supple.  Cardiovascular: Normal rate, regular rhythm, normal heart sounds and intact distal pulses.   Pulmonary/Chest: Effort normal. No respiratory distress. He has no wheezes. He has no rales.  Abdominal: Soft. Bowel sounds are normal. He exhibits no distension and no mass. There is no hepatosplenomegaly. There is tenderness in the suprapubic area. There is no rebound, no guarding and no CVA tenderness.  Musculoskeletal: He exhibits no edema.  Lymphadenopathy:    He has no cervical adenopathy.  Neurological: He is alert.  Skin: Skin is warm and dry. No rash noted. No erythema. No pallor.  Psychiatric: He has a normal mood and affect.          Assessment & Plan:   Problem List Items Addressed This Visit     Genitourinary   UTI (urinary tract infection)     With mild hematuria/malaise  tx with cipro 500 bid for 7d cx urine  F/u in 10-14 d re check urine and check in   ? Why he has this - ? Of prostate infx in distant past    Relevant Orders      Urine culture    Other Visit Diagnoses   Hematuria    -  Primary    Relevant Orders       POCT urinalysis dipstick (Completed)

## 2014-05-01 LAB — URINE CULTURE: Colony Count: >=100000

## 2014-05-07 ENCOUNTER — Encounter: Payer: Self-pay | Admitting: Family Medicine

## 2014-05-07 ENCOUNTER — Ambulatory Visit (INDEPENDENT_AMBULATORY_CARE_PROVIDER_SITE_OTHER): Payer: Medicare HMO | Admitting: Family Medicine

## 2014-05-07 VITALS — BP 126/78 | HR 60 | Temp 98.1°F | Ht 68.0 in | Wt 202.2 lb

## 2014-05-07 DIAGNOSIS — N390 Urinary tract infection, site not specified: Secondary | ICD-10-CM

## 2014-05-07 DIAGNOSIS — Z87440 Personal history of urinary (tract) infections: Secondary | ICD-10-CM

## 2014-05-07 DIAGNOSIS — N3001 Acute cystitis with hematuria: Secondary | ICD-10-CM

## 2014-05-07 LAB — POCT URINALYSIS DIPSTICK
BILIRUBIN UA: NEGATIVE
Blood, UA: NEGATIVE
GLUCOSE UA: NEGATIVE
KETONES UA: NEGATIVE
LEUKOCYTES UA: NEGATIVE
NITRITE UA: NEGATIVE
PH UA: 6
Protein, UA: NEGATIVE
Spec Grav, UA: <=1.005
Urobilinogen, UA: 0.2

## 2014-05-07 NOTE — Progress Notes (Signed)
Subjective:    Patient ID: Brian Todd, male    DOB: [birth year], 68 y.o.   MRN: [id number]  HPI Here for f/u for uti   Urine cx did grow proteus  sens to cipro  He felt better within several days  Fever came down   Today ua is clear Results for orders placed in visit on 05/07/14  POCT URINALYSIS DIPSTICK      Result Value Ref Range   Color, UA light yellow     Clarity, UA clear     Glucose, UA neg.     Bilirubin, UA neg.     Ketones, UA neg.     Spec Grav, UA <=1.005     Blood, UA neg.     pH, UA 6.0     Protein, UA neg.     Urobilinogen, UA 0.2     Nitrite, UA neg.     Leukocytes, UA Negative     he has been drinking lots of water also   Avoiding other drinks in general  Is now avoiding soft drinks   He has a tendency to get busy and put off urination  No prostate complaints   Still getting energy back   Patient Active Problem List   Diagnosis Date Noted  . UTI (urinary tract infection) 04/28/2014  . Contact dermatitis 01/28/2013  . Tinea pedis 01/28/2013  . Physical exam, annual 09/14/2012  . DEGENERATIVE JOINT DISEASE 07/30/2009  . PSA, INCREASED 07/30/2009  . HEARING LOSS, MILD 11/27/2007  . CORONARY ARTERY DISEASE 11/27/2007  . IRRITABLE BOWEL SYNDROME 11/27/2007  . GALLBLADDER DISEASE 11/27/2007  . HYPERLIPIDEMIA 08/13/2007  . HYPERTENSION 08/13/2007  . GERD 08/13/2007   Past Medical History  Diagnosis Date  . Hearing loss   . Hypertension   . Coronary artery disease   . Hyperlipidemia   . GERD (gastroesophageal reflux disease)   . IBS (irritable bowel syndrome)   . Gallbladder disease   . Increased prostate specific antigen (PSA) velocity   . DJD (degenerative joint disease)    Past Surgical History  Procedure Laterality Date  . Tonsillectomy and adenoidectomy      in the 2nd grade   History  Substance Use Topics  . Smoking status: Never Smoker   . Smokeless tobacco: Never Used  . Alcohol Use: Yes     Comment: rare use   No  family history on file. No Known Allergies Current Outpatient Prescriptions on File Prior to Visit  Medication Sig Dispense Refill  . aspirin 81 MG tablet Take 81 mg by mouth daily.      Marland Kitchen atorvastatin (LIPITOR) 20 MG tablet TAKE 1 TABLET BY MOUTH DAILY  90 tablet  2  . esomeprazole (NEXIUM) 20 MG capsule Take 40 mg by mouth daily at 12 noon. Two 20 mg tabs otc daily      . KRILL OIL 1000 MG CAPS Take 1 capsule by mouth daily.      . metoprolol (LOPRESSOR) 50 MG tablet TAKE 1/2 TABLET BY MOUTH 2 TIMES DAILY  90 tablet  2  . Multiple Vitamins-Minerals (MULTIVITAMIN & MINERAL PO) Take 1 tablet by mouth daily.      . nitroGLYCERIN (NITROSTAT) 0.4 MG SL tablet PLACE 1 TABLET UNDER THE TONGUE EVERY 5 MINS AS NEEDED  25 tablet  1  . ramipril (ALTACE) 10 MG capsule TAKE 1 CAPSULE BY MOUTH DAILY  90 capsule  2   No current facility-administered medications on file prior to visit.  Review of Systems Review of Systems  Constitutional: Negative for fever, appetite change, fatigue and unexpected weight change.  Eyes: Negative for pain and visual disturbance.  Respiratory: Negative for cough and shortness of breath.   Cardiovascular: Negative for cp or palpitations    Gastrointestinal: Negative for nausea, diarrhea and constipation.  Genitourinary: Negative for urgency and frequency.  Skin: Negative for pallor or rash   Neurological: Negative for weakness, light-headedness, numbness and headaches.  Hematological: Negative for adenopathy. Does not bruise/bleed easily.  Psychiatric/Behavioral: Negative for dysphoric mood. The patient is not nervous/anxious.         Objective:   Physical Exam  Constitutional: He appears well-developed and well-nourished. No distress.  Eyes: Conjunctivae and EOM are normal. Pupils are equal, round, and reactive to light.  Cardiovascular: Normal rate and regular rhythm.   Pulmonary/Chest: Effort normal and breath sounds normal.  Abdominal: Soft. Bowel sounds  are normal. He exhibits no distension and no mass. There is no tenderness. There is no rebound and no guarding.  No suprapubic tenderness or fullness  No cva tenderness   Neurological: He is alert.  Skin: Skin is warm and dry. No pallor.  Psychiatric: He has a normal mood and affect.          Assessment & Plan:   Problem List Items Addressed This Visit     Genitourinary   UTI (urinary tract infection)     Much improved ua clear  Proteus - tx with cipro  Will alert if symptoms re occur (if so consider urol consult)     Other Visit Diagnoses   History of UTI    -  Primary    Relevant Orders       POCT urinalysis dipstick (Completed)

## 2014-05-07 NOTE — Patient Instructions (Signed)
Urine is clear today -that is reassuring  If symptoms return please let me know - we would also want you to see a urologist if this becomes recurrent  Drink lots of water Also do not hold your urine too long

## 2014-05-07 NOTE — Assessment & Plan Note (Signed)
Much improved ua clear  Proteus - tx with cipro  Will alert if symptoms re occur (if so consider urol consult)

## 2014-05-07 NOTE — Progress Notes (Signed)
Pre visit review using our clinic review tool, if applicable. No additional management support is needed unless otherwise documented below in the visit note. 

## 2014-06-16 ENCOUNTER — Other Ambulatory Visit: Payer: Self-pay | Admitting: Pulmonary Disease

## 2014-06-17 ENCOUNTER — Other Ambulatory Visit: Payer: Self-pay | Admitting: Family Medicine

## 2014-09-12 ENCOUNTER — Other Ambulatory Visit: Payer: Self-pay | Admitting: Pulmonary Disease

## 2014-10-23 ENCOUNTER — Telehealth: Payer: Self-pay | Admitting: Family Medicine

## 2014-10-23 DIAGNOSIS — I10 Essential (primary) hypertension: Secondary | ICD-10-CM

## 2014-10-23 DIAGNOSIS — R972 Elevated prostate specific antigen [PSA]: Secondary | ICD-10-CM

## 2014-10-23 DIAGNOSIS — E785 Hyperlipidemia, unspecified: Secondary | ICD-10-CM

## 2014-10-23 NOTE — Telephone Encounter (Signed)
-----   Message from Marchia Bond sent at 10/20/2014  3:56 PM EDT ----- Regarding: Cpx labs Fri 10/24/14 Please order  future cpx labs for pt's upcoming lab appt. Thanks Aniceto Boss

## 2014-10-24 ENCOUNTER — Other Ambulatory Visit (INDEPENDENT_AMBULATORY_CARE_PROVIDER_SITE_OTHER): Payer: Medicare HMO

## 2014-10-24 DIAGNOSIS — E785 Hyperlipidemia, unspecified: Secondary | ICD-10-CM | POA: Diagnosis not present

## 2014-10-24 DIAGNOSIS — I10 Essential (primary) hypertension: Secondary | ICD-10-CM

## 2014-10-24 DIAGNOSIS — R972 Elevated prostate specific antigen [PSA]: Secondary | ICD-10-CM

## 2014-10-24 LAB — LIPID PANEL
Cholesterol: 147 mg/dL (ref 0–200)
HDL: 36.8 mg/dL — ABNORMAL LOW (ref >39.00)
LDL Cholesterol: 76 mg/dL (ref 0–99)
NONHDL: 110.2
Total CHOL/HDL Ratio: 4
Triglycerides: 171 mg/dL — ABNORMAL HIGH (ref 0.0–149.0)
VLDL: 34.2 mg/dL (ref 0.0–40.0)

## 2014-10-24 LAB — COMPREHENSIVE METABOLIC PANEL
ALBUMIN: 3.9 g/dL (ref 3.5–5.2)
ALT: 25 U/L (ref 0–53)
AST: 20 U/L (ref 0–37)
Alkaline Phosphatase: 95 U/L (ref 39–117)
BUN: 19 mg/dL (ref 6–23)
CHLORIDE: 104 meq/L (ref 96–112)
CO2: 32 mEq/L (ref 19–32)
Calcium: 9.6 mg/dL (ref 8.4–10.5)
Creatinine, Ser: 0.88 mg/dL (ref 0.40–1.50)
GFR: 91.81 mL/min (ref >60.00)
Glucose, Bld: 93 mg/dL (ref 70–99)
Potassium: 4.9 mEq/L (ref 3.5–5.1)
Sodium: 139 mEq/L (ref 135–145)
TOTAL PROTEIN: 6.6 g/dL (ref 6.0–8.3)
Total Bilirubin: 0.8 mg/dL (ref 0.2–1.2)

## 2014-10-24 LAB — CBC WITH DIFFERENTIAL/PLATELET
BASOS ABS: 0 10*3/uL (ref 0.0–0.1)
Basophils Relative: 0.5 % (ref 0.0–3.0)
EOS ABS: 0.2 10*3/uL (ref 0.0–0.7)
Eosinophils Relative: 2.7 % (ref 0.0–5.0)
HCT: 42.1 % (ref 39.0–52.0)
HEMOGLOBIN: 14.3 g/dL (ref 13.0–17.0)
LYMPHS ABS: 2.2 10*3/uL (ref 0.7–4.0)
LYMPHS PCT: 32.5 % (ref 12.0–46.0)
MCHC: 33.9 g/dL (ref 30.0–36.0)
MCV: 89.7 fl (ref 78.0–100.0)
MONO ABS: 0.7 10*3/uL (ref 0.1–1.0)
Monocytes Relative: 9.6 % (ref 3.0–12.0)
NEUTROS ABS: 3.7 10*3/uL (ref 1.4–7.7)
Neutrophils Relative %: 54.7 % (ref 43.0–77.0)
Platelets: 201 10*3/uL (ref 150.0–400.0)
RBC: 4.7 Mil/uL (ref 4.22–5.81)
RDW: 13.3 % (ref 11.5–15.5)
WBC: 6.8 10*3/uL (ref 4.0–10.5)

## 2014-10-24 LAB — TSH: TSH: 3.25 u[IU]/mL (ref 0.35–4.50)

## 2014-10-24 LAB — PSA: PSA: 2.94 ng/mL (ref 0.10–4.00)

## 2014-10-31 ENCOUNTER — Encounter: Payer: Self-pay | Admitting: Family Medicine

## 2014-10-31 ENCOUNTER — Ambulatory Visit (INDEPENDENT_AMBULATORY_CARE_PROVIDER_SITE_OTHER): Payer: Medicare HMO | Admitting: Family Medicine

## 2014-10-31 VITALS — BP 120/70 | HR 53 | Temp 98.0°F | Ht 67.75 in | Wt 199.8 lb

## 2014-10-31 DIAGNOSIS — R972 Elevated prostate specific antigen [PSA]: Secondary | ICD-10-CM

## 2014-10-31 DIAGNOSIS — E785 Hyperlipidemia, unspecified: Secondary | ICD-10-CM | POA: Diagnosis not present

## 2014-10-31 DIAGNOSIS — Z23 Encounter for immunization: Secondary | ICD-10-CM | POA: Diagnosis not present

## 2014-10-31 DIAGNOSIS — I10 Essential (primary) hypertension: Secondary | ICD-10-CM

## 2014-10-31 DIAGNOSIS — Z0000 Encounter for general adult medical examination without abnormal findings: Secondary | ICD-10-CM | POA: Insufficient documentation

## 2014-10-31 MED ORDER — ESOMEPRAZOLE MAGNESIUM 20 MG PO CPDR: 40.0000 mg | DELAYED_RELEASE_CAPSULE | Freq: Every day | ORAL | Status: DC

## 2014-10-31 MED ORDER — RAMIPRIL 10 MG PO CAPS: 10.0000 mg | ORAL_CAPSULE | Freq: Every day | ORAL | Status: DC

## 2014-10-31 MED ORDER — ATORVASTATIN CALCIUM 20 MG PO TABS: 20.0000 mg | ORAL_TABLET | Freq: Every day | ORAL | Status: DC

## 2014-10-31 MED ORDER — METOPROLOL TARTRATE 50 MG PO TABS: ORAL_TABLET | ORAL | Status: DC

## 2014-10-31 NOTE — Patient Instructions (Signed)
prevnar vaccine today  If you are interested in a shingles/zoster vaccine - call your insurance to check on coverage,( you should not get it within 1 month of other vaccines) , then call us for a prescription  for it to take to a pharmacy that gives the shot , or make a nurse visit to get it here depending on your coverage Work on an Advance Directive if you do not already have one  For HDL (good) cholesterol - do try to increase exercise   Schedule non fasting labs for PSA (watching it) in 6 months

## 2014-10-31 NOTE — Progress Notes (Signed)
Subjective:    Patient ID: Brian Todd, male    DOB: [birth year], 69 y.o.   MRN: [id number]  HPI Here for annual medicare wellness visit as well as chronic/acute medical problems    I have personally reviewed the Medicare Annual Wellness questionnaire and have noted 1. The patient's medical and social history 2. Their use of alcohol, tobacco or illicit drugs 3. Their current medications and supplements 4. The patient's functional ability including ADL's, fall risks, home safety risks and hearing or visual             impairment. 5. Diet and physical activities 6. Evidence for depression or mood disorders  The patients weight, height, BMI have been recorded in the chart and visual acuity is per eye clinic.  I have made referrals, counseling and provided education to the patient based review of the above and I have provided the pt with a written personalized care plan for preventive services.  Is feeling ok    See scanned forms.  Routine anticipatory guidance given to patient.  See health maintenance. Colon cancer screening 1/10 - 10 year recall /normal  Flu vaccine 9/15 Tetanus vaccine 2/12  Pneumovax 2/15 , needs prevnar today  Zoster vaccine - had not found out if it is covered yet-is interested  Hep C screening -not high risk / declines  Prostate cancer screening-  Lab Results  Component Value Date   PSA 2.94 10/24/2014   PSA 1.78 09/16/2013   PSA 1.71 09/12/2012    Advance directive  - thinks he has that set up / not positive  Cognitive function addressed- see scanned forms- and if abnormal then additional documentation follows. -no memory problems   PMH and SH reviewed  Meds, vitals, and allergies reviewed.   ROS: See HPI.  Otherwise negative.      Lab Results  Component Value Date   PSA 2.94 10/24/2014   PSA 1.78 09/16/2013   PSA 1.71 09/12/2012   no BPH symptoms at all  Seldom nocturia -only if he drinks water right before bed  No family history of  prostate cancer   bp is stable today  No cp or palpitations or headaches or edema  No side effects to medicines  BP Readings from Last 3 Encounters:  10/31/14 120/70  05/07/14 126/78  04/28/14 148/72     Wt is down 3 lb - bmi of 30  Is eating healthy most of the time  Not as much exercise as he should  Would like to loosse to 180  Hyperlipidemia Lab Results  Component Value Date   CHOL 147 10/24/2014   CHOL 140 09/16/2013   CHOL 127 09/12/2012   Lab Results  Component Value Date   HDL 36.80* 10/24/2014   HDL 42.90 09/16/2013   HDL 36.80* 09/12/2012   Lab Results  Component Value Date   LDLCALC 76 10/24/2014   LDLCALC 68 09/16/2013   LDLCALC 66 09/12/2012   Lab Results  Component Value Date   TRIG 171.0* 10/24/2014   TRIG 146.0 09/16/2013   TRIG 119.0 09/12/2012   Lab Results  Component Value Date   CHOLHDL 4 10/24/2014   CHOLHDL 3 09/16/2013   CHOLHDL 3 09/12/2012   Lab Results  Component Value Date   LDLDIRECT 76.5 09/02/2010   good cholesterol is down  Needs to limit sugars -that is problematic at night  Plans to work on this and exercise     Chemistry      Component Value  Date/Time   NA 139 10/24/2014 0817   K 4.9 10/24/2014 0817   CL 104 10/24/2014 0817   CO2 32 10/24/2014 0817   BUN 19 10/24/2014 0817   CREATININE 0.88 10/24/2014 0817      Component Value Date/Time   CALCIUM 9.6 10/24/2014 0817   ALKPHOS 95 10/24/2014 0817   AST 20 10/24/2014 0817   ALT 25 10/24/2014 0817   BILITOT 0.8 10/24/2014 0817      Lab Results  Component Value Date   WBC 6.8 10/24/2014   HGB 14.3 10/24/2014   HCT 42.1 10/24/2014   MCV 89.7 10/24/2014   PLT 201.0 10/24/2014    Lab Results  Component Value Date   TSH 3.25 10/24/2014    Patient Active Problem List   Diagnosis Date Noted  . Encounter for Medicare annual wellness exam 10/31/2014  . UTI (urinary tract infection) 04/28/2014  . Contact dermatitis 01/28/2013  . Tinea pedis 01/28/2013  .  Physical exam, annual 09/14/2012  . DEGENERATIVE JOINT DISEASE 07/30/2009  . PSA, INCREASED 07/30/2009  . HEARING LOSS, MILD 11/27/2007  . CORONARY ARTERY DISEASE 11/27/2007  . IRRITABLE BOWEL SYNDROME 11/27/2007  . GALLBLADDER DISEASE 11/27/2007  . Hyperlipidemia 08/13/2007  . Essential hypertension 08/13/2007  . GERD 08/13/2007   Past Medical History  Diagnosis Date  . Hearing loss   . Hypertension   . Coronary artery disease   . Hyperlipidemia   . GERD (gastroesophageal reflux disease)   . IBS (irritable bowel syndrome)   . Gallbladder disease   . Increased prostate specific antigen (PSA) velocity   . DJD (degenerative joint disease)    Past Surgical History  Procedure Laterality Date  . Tonsillectomy and adenoidectomy      in the 2nd grade   History  Substance Use Topics  . Smoking status: Never Smoker   . Smokeless tobacco: Never Used  . Alcohol Use: Yes     Comment: rare use   No family history on file. No Known Allergies Current Outpatient Prescriptions on File Prior to Visit  Medication Sig Dispense Refill  . aspirin 81 MG tablet Take 81 mg by mouth daily.    Marland Kitchen atorvastatin (LIPITOR) 20 MG tablet TAKE 1 TABLET BY MOUTH DAILY 90 tablet 2  . esomeprazole (NEXIUM) 20 MG capsule Take 40 mg by mouth daily at 12 noon. Two 20 mg tabs otc daily    . KRILL OIL 1000 MG CAPS Take 1 capsule by mouth daily.    . metoprolol (LOPRESSOR) 50 MG tablet TAKE 1/2 TABLET BY MOUTH 2 TIMES DAILY 90 tablet 2  . Multiple Vitamins-Minerals (MULTIVITAMIN & MINERAL PO) Take 1 tablet by mouth daily.    . nitroGLYCERIN (NITROSTAT) 0.4 MG SL tablet PLACE 1 TABLET UNDER THE TONGUE EVERY 5 MINS AS NEEDED 25 tablet 1  . ramipril (ALTACE) 10 MG capsule TAKE 1 CAPSULE BY MOUTH DAILY 90 capsule 1   No current facility-administered medications on file prior to visit.    Review of Systems Review of Systems  Constitutional: Negative for fever, appetite change, fatigue and unexpected weight  change.  Eyes: Negative for pain and visual disturbance.  Respiratory: Negative for cough and shortness of breath.   Cardiovascular: Negative for cp or palpitations    Gastrointestinal: Negative for nausea, diarrhea and constipation.  Genitourinary: Negative for urgency and frequency.  Skin: Negative for pallor or rash   Neurological: Negative for weakness, light-headedness, numbness and headaches.  Hematological: Negative for adenopathy. Does not bruise/bleed easily.  Psychiatric/Behavioral: Negative for dysphoric mood. The patient is not nervous/anxious.         Objective:   Physical Exam  Constitutional: He appears well-developed and well-nourished. No distress.  obese and well appearing    HENT:  Head: Normocephalic and atraumatic.  Right Ear: External ear normal.  Left Ear: External ear normal.  Nose: Nose normal.  Mouth/Throat: Oropharynx is clear and moist.  Eyes: Conjunctivae and EOM are normal. Pupils are equal, round, and reactive to light. Right eye exhibits no discharge. Left eye exhibits no discharge. No scleral icterus.  Neck: Normal range of motion. Neck supple. No JVD present. Carotid bruit is not present. No thyromegaly present.  Cardiovascular: Normal rate, regular rhythm, normal heart sounds and intact distal pulses.  Exam reveals no gallop.   Pulmonary/Chest: Effort normal and breath sounds normal. No respiratory distress. He has no wheezes. He exhibits no tenderness.  Abdominal: Soft. Bowel sounds are normal. He exhibits no distension, no abdominal bruit and no mass. There is no tenderness.  Musculoskeletal: He exhibits no edema or tenderness.  Lymphadenopathy:    He has no cervical adenopathy.  Neurological: He is alert. He has normal reflexes. No cranial nerve deficit. He exhibits normal muscle tone. Coordination normal.  Skin: Skin is warm and dry. No rash noted. No erythema. No pallor.  Psychiatric: He has a normal mood and affect.          Assessment  & Plan:   Problem List Items Addressed This Visit      Cardiovascular and Mediastinum   Essential hypertension - Primary    bp in fair control at this time  BP Readings from Last 1 Encounters:  10/31/14 120/70   No changes needed Disc lifstyle change with low sodium diet and exercise  Labs reviewed       Relevant Medications   atorvastatin (LIPITOR) tablet   metoprolol (LOPRESSOR) tablet   ramipril (ALTACE) capsule     Other   Encounter for Medicare annual wellness exam    Reviewed health habits including diet and exercise and skin cancer prevention Reviewed appropriate screening tests for age  Also reviewed health mt list, fam hx and immunization status , as well as social and family history   Labs reviewed  psa up about a point-no symptoms (re check in 6 mo) prevnar vaccine today  If you are interested in a shingles/zoster vaccine - call your insurance to check on coverage,( you should not get it within 1 month of other vaccines) , then call us for a prescription  for it to take to a pharmacy that gives the shot , or make a nurse visit to get it here depending on your coverage Work on an Advance Directive if you do not already have one  For HDL (good) cholesterol - do try to increase exercise   Schedule non fasting labs for PSA (watching it) in 6 months       Relevant Orders   Pneumococcal conjugate vaccine 13-valent IM (Completed)   Hyperlipidemia    Disc goals for lipids and reasons to control them Rev labs with pt Rev low sat fat diet in detail Continue lipitor and diet       Relevant Medications   atorvastatin (LIPITOR) tablet   metoprolol (LOPRESSOR) tablet   ramipril (ALTACE) capsule   PSA, INCREASED    Lab Results  Component Value Date   PSA 2.94 10/24/2014   PSA 1.78 09/16/2013   PSA 1.71 09/12/2012   Mildly up  Will watch No symptoms or family history Re check 6 mo       Other Visit Diagnoses    Need for prophylactic vaccination against  Streptococcus pneumoniae (pneumococcus)        Relevant Orders    Pneumococcal conjugate vaccine 13-valent IM (Completed)

## 2014-10-31 NOTE — Progress Notes (Signed)
Pre visit review using our clinic review tool, if applicable. No additional management support is needed unless otherwise documented below in the visit note. 

## 2014-11-02 NOTE — Assessment & Plan Note (Signed)
Disc goals for lipids and reasons to control them Rev labs with pt Rev low sat fat diet in detail Continue lipitor and diet  

## 2014-11-02 NOTE — Assessment & Plan Note (Signed)
Lab Results  Component Value Date   PSA 2.94 10/24/2014   PSA 1.78 09/16/2013   PSA 1.71 09/12/2012   Mildly up  Will watch No symptoms or family history Re check 6 mo

## 2014-11-02 NOTE — Assessment & Plan Note (Signed)
Reviewed health habits including diet and exercise and skin cancer prevention Reviewed appropriate screening tests for age  Also reviewed health mt list, fam hx and immunization status , as well as social and family history   Labs reviewed  psa up about a point-no symptoms (re check in 6 mo) prevnar vaccine today  If you are interested in a shingles/zoster vaccine - call your insurance to check on coverage,( you should not get it within 1 month of other vaccines) , then call us for a prescription  for it to take to a pharmacy that gives the shot , or make a nurse visit to get it here depending on your coverage Work on an Advance Directive if you do not already have one  For HDL (good) cholesterol - do try to increase exercise   Schedule non fasting labs for PSA (watching it) in 6 months

## 2014-11-02 NOTE — Assessment & Plan Note (Signed)
bp in fair control at this time  BP Readings from Last 1 Encounters:  10/31/14 120/70   No changes needed Disc lifstyle change with low sodium diet and exercise  Labs reviewed

## 2014-11-08 ENCOUNTER — Encounter (HOSPITAL_COMMUNITY): Payer: Self-pay | Admitting: Emergency Medicine

## 2014-11-08 ENCOUNTER — Emergency Department (HOSPITAL_COMMUNITY): Payer: Medicare HMO

## 2014-11-08 ENCOUNTER — Inpatient Hospital Stay (HOSPITAL_COMMUNITY)
Admission: EM | Admit: 2014-11-08 | Discharge: 2014-11-10 | DRG: 392 | Disposition: A | Discharge status: Home / Self Care | Payer: Medicare HMO | Attending: Family Medicine | Admitting: Family Medicine

## 2014-11-08 DIAGNOSIS — K219 Gastro-esophageal reflux disease without esophagitis: Secondary | ICD-10-CM | POA: Diagnosis present

## 2014-11-08 DIAGNOSIS — E785 Hyperlipidemia, unspecified: Secondary | ICD-10-CM | POA: Diagnosis present

## 2014-11-08 DIAGNOSIS — K625 Hemorrhage of anus and rectum: Secondary | ICD-10-CM | POA: Diagnosis not present

## 2014-11-08 DIAGNOSIS — I2510 Atherosclerotic heart disease of native coronary artery without angina pectoris: Secondary | ICD-10-CM | POA: Diagnosis present

## 2014-11-08 DIAGNOSIS — Z955 Presence of coronary angioplasty implant and graft: Secondary | ICD-10-CM

## 2014-11-08 DIAGNOSIS — Z79899 Other long term (current) drug therapy: Secondary | ICD-10-CM | POA: Diagnosis not present

## 2014-11-08 DIAGNOSIS — Z7982 Long term (current) use of aspirin: Secondary | ICD-10-CM

## 2014-11-08 DIAGNOSIS — M1990 Unspecified osteoarthritis, unspecified site: Secondary | ICD-10-CM | POA: Diagnosis present

## 2014-11-08 DIAGNOSIS — I10 Essential (primary) hypertension: Secondary | ICD-10-CM | POA: Diagnosis present

## 2014-11-08 DIAGNOSIS — Z8719 Personal history of other diseases of the digestive system: Secondary | ICD-10-CM | POA: Diagnosis present

## 2014-11-08 DIAGNOSIS — K529 Noninfective gastroenteritis and colitis, unspecified: Principal | ICD-10-CM | POA: Diagnosis present

## 2014-11-08 LAB — LIPASE, BLOOD: Lipase: 24 U/L (ref 11–59)

## 2014-11-08 LAB — CBC WITH DIFFERENTIAL/PLATELET
Basophils Absolute: 0 10*3/uL (ref 0.0–0.1)
Basophils Relative: 0 % (ref 0–1)
EOS ABS: 0 10*3/uL (ref 0.0–0.7)
EOS PCT: 0 % (ref 0–5)
HCT: 42.8 % (ref 39.0–52.0)
HEMOGLOBIN: 14.5 g/dL (ref 13.0–17.0)
Lymphocytes Relative: 16 % (ref 12–46)
Lymphs Abs: 2 10*3/uL (ref 0.7–4.0)
MCH: 30.4 pg (ref 26.0–34.0)
MCHC: 33.9 g/dL (ref 30.0–36.0)
MCV: 89.7 fL (ref 78.0–100.0)
MONOS PCT: 7 % (ref 3–12)
Monocytes Absolute: 0.9 10*3/uL (ref 0.1–1.0)
Neutro Abs: 9.4 10*3/uL — ABNORMAL HIGH (ref 1.7–7.7)
Neutrophils Relative %: 77 % (ref 43–77)
PLATELETS: 203 10*3/uL (ref 150–400)
RBC: 4.77 MIL/uL (ref 4.22–5.81)
RDW: 12.6 % (ref 11.5–15.5)
WBC: 12.3 10*3/uL — AB (ref 4.0–10.5)

## 2014-11-08 LAB — COMPREHENSIVE METABOLIC PANEL
ALT: 25 U/L (ref 0–53)
AST: 26 U/L (ref 0–37)
Albumin: 3.9 g/dL (ref 3.5–5.2)
Alkaline Phosphatase: 93 U/L (ref 39–117)
Anion gap: 12 (ref 5–15)
BUN: 11 mg/dL (ref 6–23)
CHLORIDE: 105 mmol/L (ref 96–112)
CO2: 22 mmol/L (ref 19–32)
CREATININE: 0.94 mg/dL (ref 0.50–1.35)
Calcium: 9.2 mg/dL (ref 8.4–10.5)
GFR calc Af Amer: >90 mL/min (ref >90)
GFR calc non Af Amer: 85 mL/min — ABNORMAL LOW (ref >90)
Glucose, Bld: 94 mg/dL (ref 70–99)
Potassium: 4 mmol/L (ref 3.5–5.1)
Sodium: 139 mmol/L (ref 135–145)
Total Bilirubin: 0.9 mg/dL (ref 0.3–1.2)
Total Protein: 7.2 g/dL (ref 6.0–8.3)

## 2014-11-08 LAB — POC OCCULT BLOOD, ED: Fecal Occult Bld: POSITIVE — AB

## 2014-11-08 MED ORDER — ASPIRIN EC 81 MG PO TBEC
81.0000 mg | DELAYED_RELEASE_TABLET | Freq: Every day | ORAL | Status: DC
  Administered 2014-11-09 – 2014-11-10 (×2): 81 mg via ORAL
  Filled 2014-11-08 (×2): qty 1

## 2014-11-08 MED ORDER — METOPROLOL TARTRATE 50 MG PO TABS
50.0000 mg | ORAL_TABLET | Freq: Two times a day (BID) | ORAL | Status: DC
  Filled 2014-11-08: qty 1

## 2014-11-08 MED ORDER — IOHEXOL 300 MG/ML  SOLN
25.0000 mL | Freq: Once | INTRAMUSCULAR | Status: AC | PRN
  Administered 2014-11-08: 25 mL via ORAL

## 2014-11-08 MED ORDER — IOHEXOL 300 MG/ML  SOLN
100.0000 mL | Freq: Once | INTRAMUSCULAR | Status: AC | PRN
  Administered 2014-11-08: 100 mL via INTRAVENOUS

## 2014-11-08 MED ORDER — ONDANSETRON HCL 4 MG/2ML IJ SOLN
4.0000 mg | Freq: Once | INTRAMUSCULAR | Status: DC
Start: 2014-11-08 — End: 2014-11-10
  Filled 2014-11-08: qty 2

## 2014-11-08 MED ORDER — ACETAMINOPHEN 650 MG RE SUPP: 650.0000 mg | Freq: Four times a day (QID) | RECTAL | Status: DC | PRN

## 2014-11-08 MED ORDER — SODIUM CHLORIDE 0.9 % IV SOLN
INTRAVENOUS | Status: DC
  Administered 2014-11-08 (×2): via INTRAVENOUS

## 2014-11-08 MED ORDER — ATORVASTATIN CALCIUM 20 MG PO TABS
20.0000 mg | ORAL_TABLET | Freq: Every day | ORAL | Status: DC
  Administered 2014-11-09 – 2014-11-10 (×2): 20 mg via ORAL
  Filled 2014-11-08 (×2): qty 1

## 2014-11-08 MED ORDER — PANTOPRAZOLE SODIUM 40 MG PO TBEC
40.0000 mg | DELAYED_RELEASE_TABLET | Freq: Every day | ORAL | Status: DC
  Administered 2014-11-09: 40 mg via ORAL
  Filled 2014-11-08: qty 1

## 2014-11-08 MED ORDER — CIPROFLOXACIN IN D5W 400 MG/200ML IV SOLN
400.0000 mg | Freq: Two times a day (BID) | INTRAVENOUS | Status: DC
  Administered 2014-11-08 – 2014-11-09 (×3): 400 mg via INTRAVENOUS
  Filled 2014-11-08 (×5): qty 200

## 2014-11-08 MED ORDER — SODIUM CHLORIDE 0.9 % IJ SOLN
3.0000 mL | Freq: Two times a day (BID) | INTRAMUSCULAR | Status: DC
  Administered 2014-11-09: 3 mL via INTRAVENOUS

## 2014-11-08 MED ORDER — SODIUM CHLORIDE 0.9 % IV BOLUS (SEPSIS)
500.0000 mL | Freq: Once | INTRAVENOUS | Status: AC
  Administered 2014-11-08: 500 mL via INTRAVENOUS

## 2014-11-08 MED ORDER — SODIUM CHLORIDE 0.9 % IV SOLN: INTRAVENOUS | Status: AC

## 2014-11-08 MED ORDER — RAMIPRIL 10 MG PO CAPS
10.0000 mg | ORAL_CAPSULE | Freq: Every day | ORAL | Status: DC
  Administered 2014-11-09 – 2014-11-10 (×2): 10 mg via ORAL
  Filled 2014-11-08 (×2): qty 1

## 2014-11-08 MED ORDER — METRONIDAZOLE IN NACL 5-0.79 MG/ML-% IV SOLN
500.0000 mg | Freq: Three times a day (TID) | INTRAVENOUS | Status: DC
  Administered 2014-11-08 – 2014-11-10 (×5): 500 mg via INTRAVENOUS
  Filled 2014-11-08 (×7): qty 100

## 2014-11-08 MED ORDER — ACETAMINOPHEN 325 MG PO TABS: 650.0000 mg | ORAL_TABLET | Freq: Four times a day (QID) | ORAL | Status: DC | PRN

## 2014-11-08 MED ORDER — METOPROLOL TARTRATE 25 MG PO TABS
25.0000 mg | ORAL_TABLET | Freq: Two times a day (BID) | ORAL | Status: DC
  Administered 2014-11-08 – 2014-11-10 (×4): 25 mg via ORAL
  Filled 2014-11-08 (×5): qty 1

## 2014-11-08 MED ORDER — NITROGLYCERIN 0.4 MG SL SUBL: 0.4000 mg | SUBLINGUAL_TABLET | SUBLINGUAL | Status: DC | PRN

## 2014-11-08 NOTE — ED Notes (Signed)
Dr. Maudie Mercury at the bedside. Preparing for transfer.

## 2014-11-08 NOTE — ED Notes (Signed)
Pt. Stated. I started having abdominal cramping and went to [facility] . I continued off and on  having abdominal pain . When I went o BR today the commode was full of blood.

## 2014-11-08 NOTE — ED Notes (Signed)
Called CT, patient is ready for transport and next on the list for transport.

## 2014-11-08 NOTE — ED Notes (Signed)
Transporting patient to new room assignment. 

## 2014-11-08 NOTE — ED Notes (Signed)
Spoke with Dr. Maudie Mercury, admitting physician, patient is allowed to eat until midnight, NPO then for gi consult assess. Provided patient sandwich to take on transfer.

## 2014-11-08 NOTE — ED Notes (Signed)
CALLED CT-PT DONE WITH CONTRAST

## 2014-11-08 NOTE — ED Provider Notes (Signed)
CSN: [id number]     Arrival date & time 11/08/14  1438 History   First MD Initiated Contact with Patient 11/08/14 1650     Chief Complaint  Patient presents with  . Abdominal Pain  . Rectal Bleeding     (Consider location/radiation/quality/duration/timing/severity/associated sxs/prior Treatment) Patient is a 69 y.o. male presenting with abdominal pain and hematochezia. The history is provided by the patient.  Abdominal Pain Associated symptoms: hematochezia   Associated symptoms: no chest pain, no dysuria, no fever, no nausea, no shortness of breath and no vomiting   Rectal Bleeding Associated symptoms: no abdominal pain, no fever and no vomiting    patient healthy acute onset of bright red blood per rectum filling of most of the total of all with red blood. She did buy some abdominal cramping. No abdominal pain now. No history of any rectal bleeding in the past. Patient had a colonoscopy 7 years ago without any significant findings. No further rectal bleeding since that episode. Patient did have some abdominal cramping during the night and had a bowel movement them but did not notice that there was blood and she did not look.  Past Medical History  Diagnosis Date  . Hearing loss   . Hypertension   . Coronary artery disease   . Hyperlipidemia   . GERD (gastroesophageal reflux disease)   . IBS (irritable bowel syndrome)   . Gallbladder disease   . Increased prostate specific antigen (PSA) velocity   . DJD (degenerative joint disease)    Past Surgical History  Procedure Laterality Date  . Tonsillectomy and adenoidectomy      in the 2nd grade   No family history on file. History  Substance Use Topics  . Smoking status: Never Smoker   . Smokeless tobacco: Never Used  . Alcohol Use: Yes     Comment: rare use    Review of Systems  Constitutional: Negative for fever.  HENT: Negative for congestion.   Respiratory: Negative for shortness of breath.   Cardiovascular: Negative  for chest pain.  Gastrointestinal: Positive for blood in stool and hematochezia. Negative for nausea, vomiting and abdominal pain.  Genitourinary: Negative for dysuria.  Musculoskeletal: Negative for back pain.  Skin: Negative for rash.  Neurological: Negative for headaches.  Hematological: Does not bruise/bleed easily.  Psychiatric/Behavioral: Negative for confusion.      Allergies  Review of patient's allergies indicates no known allergies.  Home Medications   Prior to Admission medications   Medication Sig Start Date End Date Taking? Authorizing Provider  aspirin 81 MG tablet Take 81 mg by mouth daily.   Yes Historical Provider, MD  atorvastatin (LIPITOR) 20 MG tablet Take 1 tablet (20 mg total) by mouth daily. 10/31/14  Yes Abner Greenspan, MD  esomeprazole (NEXIUM) 20 MG capsule Take 2 capsules (40 mg total) by mouth daily at 12 noon. Two 20 mg tabs otc daily 10/31/14  Yes Marne A Tower, MD  KRILL OIL 1000 MG CAPS Take 1 capsule by mouth daily.   Yes Historical Provider, MD  metoprolol (LOPRESSOR) 50 MG tablet TAKE 1/2 TABLET BY MOUTH 2 TIMES DAILY 10/31/14  Yes Abner Greenspan, MD  Multiple Vitamins-Minerals (MULTIVITAMIN & MINERAL PO) Take 1 tablet by mouth daily.   Yes Historical Provider, MD  ramipril (ALTACE) 10 MG capsule Take 1 capsule (10 mg total) by mouth daily. 10/31/14  Yes Abner Greenspan, MD  nitroGLYCERIN (NITROSTAT) 0.4 MG SL tablet PLACE 1 TABLET UNDER THE TONGUE EVERY 5 MINS  AS NEEDED 09/16/13   Noralee Space, MD   BP 177/75 mmHg  Pulse 87  Temp(Src) 98.3 F (36.8 C) (Oral)  Resp 13  Ht 5\' 7"  (1.702 m)  Wt 203 lb 3.2 oz (92.171 kg)  BMI 31.82 kg/m2  SpO2 96% Physical Exam  Constitutional: He is oriented to person, place, and time. He appears well-developed and well-nourished. No distress.  HENT:  Head: Normocephalic and atraumatic.  Mouth/Throat: Oropharynx is clear and moist.  Eyes: Conjunctivae and EOM are normal. Pupils are equal, round, and reactive to light.   Neck: Normal range of motion.  Cardiovascular: Normal rate, regular rhythm and normal heart sounds.   Pulmonary/Chest: Effort normal and breath sounds normal.  Abdominal: Soft. Bowel sounds are normal. There is no tenderness.  Genitourinary:  Perianal area was some skin tags no external hemorrhoids no prolapsed internal hemorrhoids. On rectal exam no stool in the vault. Slight pinkish colored no gross blood. Hemoccult card pending.  Musculoskeletal: Normal range of motion.  Neurological: He is alert and oriented to person, place, and time. No cranial nerve deficit. He exhibits normal muscle tone. Coordination normal.  Skin: Skin is warm.  Nursing note and vitals reviewed.   ED Course  Procedures (including critical care time) Labs Review Labs Reviewed  COMPREHENSIVE METABOLIC PANEL - Abnormal; Notable for the following:    GFR calc non Af Amer 85 (*)    All other components within normal limits  CBC WITH DIFFERENTIAL/PLATELET - Abnormal; Notable for the following:    WBC 12.3 (*)    Neutro Abs 9.4 (*)    All other components within normal limits  LIPASE, BLOOD  POC OCCULT BLOOD, ED   Results for orders placed or performed during the hospital encounter of 11/08/14  Lipase, blood  Result Value Ref Range   Lipase 24 11 - 59 U/L  Comprehensive metabolic panel  Result Value Ref Range   Sodium 139 135 - 145 mmol/L   Potassium 4.0 3.5 - 5.1 mmol/L   Chloride 105 96 - 112 mmol/L   CO2 22 19 - 32 mmol/L   Glucose, Bld 94 70 - 99 mg/dL   BUN 11 6 - 23 mg/dL   Creatinine, Ser 0.94 0.50 - 1.35 mg/dL   Calcium 9.2 8.4 - 10.5 mg/dL   Total Protein 7.2 6.0 - 8.3 g/dL   Albumin 3.9 3.5 - 5.2 g/dL   AST 26 0 - 37 U/L   ALT 25 0 - 53 U/L   Alkaline Phosphatase 93 39 - 117 U/L   Total Bilirubin 0.9 0.3 - 1.2 mg/dL   GFR calc non Af Amer 85 (L) >90 mL/min   GFR calc Af Amer >90 >90 mL/min   Anion gap 12 5 - 15  CBC with Differential/Platelet  Result Value Ref Range   WBC 12.3 (H) 4.0  - 10.5 K/uL   RBC 4.77 4.22 - 5.81 MIL/uL   Hemoglobin 14.5 13.0 - 17.0 g/dL   HCT 42.8 39.0 - 52.0 %   MCV 89.7 78.0 - 100.0 fL   MCH 30.4 26.0 - 34.0 pg   MCHC 33.9 30.0 - 36.0 g/dL   RDW 12.6 11.5 - 15.5 %   Platelets 203 150 - 400 K/uL   Neutrophils Relative % 77 43 - 77 %   Neutro Abs 9.4 (H) 1.7 - 7.7 K/uL   Lymphocytes Relative 16 12 - 46 %   Lymphs Abs 2.0 0.7 - 4.0 K/uL   Monocytes Relative 7 3 -  12 %   Monocytes Absolute 0.9 0.1 - 1.0 K/uL   Eosinophils Relative 0 0 - 5 %   Eosinophils Absolute 0.0 0.0 - 0.7 K/uL   Basophils Relative 0 0 - 1 %   Basophils Absolute 0.0 0.0 - 0.1 K/uL    Imaging Review Ct Abdomen Pelvis W Contrast  11/08/2014   CLINICAL DATA:  Patient with lower abdominal cramping. Blood during bowel movement.  EXAM: CT ABDOMEN AND PELVIS WITH CONTRAST  TECHNIQUE: Multidetector CT imaging of the abdomen and pelvis was performed using the standard protocol following bolus administration of intravenous contrast.  CONTRAST:  143mL OMNIPAQUE IOHEXOL 300 MG/ML  SOLN  COMPARISON:  None.  FINDINGS: Lower chest: No consolidative or nodular pulmonary opacities. Minimal atelectasis left lung base. Normal heart size.  Hepatobiliary: Liver is normal in size and contour. No focal hepatic lesion is identified. Gallbladder is unremarkable. No intrahepatic or extrahepatic biliary ductal dilatation.  Pancreas: Unremarkable  Spleen: Unremarkable  Adrenals/Urinary Tract: Normal adrenal glands. Kidneys enhance symmetrically with contrast. No ureterolithiasis. Urinary bladder is unremarkable.  Stomach/Bowel: Sigmoid colonic diverticulosis. There is circumferential wall thickening of the descending colon (image 42; series 201). The appendix is normal. No evidence for small bowel obstruction. No free fluid or free intraperitoneal air.  Vascular/Lymphatic: Normal caliber abdominal aorta. No retroperitoneal lymphadenopathy.  Other: Small fat containing left inguinal hernia. Prostate  unremarkable.  Musculoskeletal: No aggressive or acute appearing osseous lesions. Lower lumbar spine degenerative changes.  IMPRESSION: Circumferential wall thickening of the descending colon with mild surrounding fat stranding. Findings are concerning for colitis with differential considerations including infectious, inflammatory ischemic processes.   Electronically Signed   By: Lovey Newcomer M.D.   On: 11/08/2014 20:16     EKG Interpretation None      MDM   Final diagnoses:  Rectal bleeding  Colitis    Patient with acute onset of bright red blood per rectum today. Preceded by some abdominal cramping. No abdominal pain now. CT scan shows descending colon colitis. Probably the source of bleeding. On rectal exam no gross blood but was a little pinkish. Hemoccult the in the lab is pending. Patient's hemoglobin and hematocrit stable here. Patient's vital signs stable.  Patient followed by Dr. Glori Bickers will be admitted by [facility] hospitalist service.   Fredia Sorrow, MD 11/08/14 2116

## 2014-11-08 NOTE — H&P (Signed)
Brian Todd is an 69 y.o. male.    [name] (pcp) Brian Todd  Prev  Chief Complaint: brbpr HPI: 69 yo male with brbpr, in toilet bowel and on the toilet paper today.  Denies n/v, abd pain, diarrhea, black stool.  Pt takes aspirin on a regular basis.  Pt denies nsaid use.  Pt presented to ED for evaluation and was found to have colitis on CT scan. Pt will be admitted for colitis.   Past Medical History  Diagnosis Date  . Hearing loss   . Hypertension   . Coronary artery disease   . Hyperlipidemia   . GERD (gastroesophageal reflux disease)   . IBS (irritable bowel syndrome)   . Gallbladder disease   . Increased prostate specific antigen (PSA) velocity   . DJD (degenerative joint disease)     Past Surgical History  Procedure Laterality Date  . Tonsillectomy and adenoidectomy      in the 2nd grade  . Coronary stent placement      Dr. [name]  . Colonoscopy      Family History  Problem Relation Age of Onset  . Stroke Mother    Social History:  reports that he has never smoked. He has never used smokeless tobacco. He reports that he drinks alcohol. He reports that he does not use illicit drugs.  Allergies: No Known Allergies Medication reviewed  (Not in a hospital admission)  Results for orders placed or performed during the hospital encounter of 11/08/14 (from the past 48 hour(s))  Lipase, blood     Status: None   Collection Time: 11/08/14  5:29 PM  Result Value Ref Range   Lipase 24 11 - 59 U/L  Comprehensive metabolic panel     Status: Abnormal   Collection Time: 11/08/14  5:29 PM  Result Value Ref Range   Sodium 139 135 - 145 mmol/L   Potassium 4.0 3.5 - 5.1 mmol/L   Chloride 105 96 - 112 mmol/L   CO2 22 19 - 32 mmol/L   Glucose, Bld 94 70 - 99 mg/dL   BUN 11 6 - 23 mg/dL   Creatinine, Ser 0.94 0.50 - 1.35 mg/dL   Calcium 9.2 8.4 - 10.5 mg/dL   Total Protein 7.2 6.0 - 8.3 g/dL   Albumin 3.9 3.5 - 5.2 g/dL   AST 26 0 - 37 U/L   ALT 25 0 - 53 U/L    Alkaline Phosphatase 93 39 - 117 U/L   Total Bilirubin 0.9 0.3 - 1.2 mg/dL   GFR calc non Af Amer 85 (L) >90 mL/min   GFR calc Af Amer >90 >90 mL/min    Comment: (NOTE) The eGFR has been calculated using the CKD EPI equation. This calculation has not been validated in all clinical situations. eGFR's persistently <90 mL/min signify possible Chronic Kidney Disease.    Anion gap 12 5 - 15  CBC with Differential/Platelet     Status: Abnormal   Collection Time: 11/08/14  5:29 PM  Result Value Ref Range   WBC 12.3 (H) 4.0 - 10.5 K/uL   RBC 4.77 4.22 - 5.81 MIL/uL   Hemoglobin 14.5 13.0 - 17.0 g/dL   HCT 42.8 39.0 - 52.0 %   MCV 89.7 78.0 - 100.0 fL   MCH 30.4 26.0 - 34.0 pg   MCHC 33.9 30.0 - 36.0 g/dL   RDW 12.6 11.5 - 15.5 %   Platelets 203 150 - 400 K/uL   Neutrophils Relative % 77 43 -  77 %   Neutro Abs 9.4 (H) 1.7 - 7.7 K/uL   Lymphocytes Relative 16 12 - 46 %   Lymphs Abs 2.0 0.7 - 4.0 K/uL   Monocytes Relative 7 3 - 12 %   Monocytes Absolute 0.9 0.1 - 1.0 K/uL   Eosinophils Relative 0 0 - 5 %   Eosinophils Absolute 0.0 0.0 - 0.7 K/uL   Basophils Relative 0 0 - 1 %   Basophils Absolute 0.0 0.0 - 0.1 K/uL   Ct Abdomen Pelvis W Contrast  11/08/2014   CLINICAL DATA:  Patient with Todd abdominal cramping. Blood during bowel movement.  EXAM: CT ABDOMEN AND PELVIS WITH CONTRAST  TECHNIQUE: Multidetector CT imaging of the abdomen and pelvis was performed using the standard protocol following bolus administration of intravenous contrast.  CONTRAST:  134m OMNIPAQUE IOHEXOL 300 MG/ML  SOLN  COMPARISON:  None.  FINDINGS: Todd chest: No consolidative or nodular pulmonary opacities. Minimal atelectasis left lung base. Normal heart size.  Hepatobiliary: Liver is normal in size and contour. No focal hepatic lesion is identified. Gallbladder is unremarkable. No intrahepatic or extrahepatic biliary ductal dilatation.  Pancreas: Unremarkable  Spleen: Unremarkable  Adrenals/Urinary Tract: Normal  adrenal glands. Kidneys enhance symmetrically with contrast. No ureterolithiasis. Urinary bladder is unremarkable.  Stomach/Bowel: Sigmoid colonic diverticulosis. There is circumferential wall thickening of the descending colon (image 42; series 201). The appendix is normal. No evidence for small bowel obstruction. No free fluid or free intraperitoneal air.  Vascular/Lymphatic: Normal caliber abdominal aorta. No retroperitoneal lymphadenopathy.  Other: Small fat containing left inguinal hernia. Prostate unremarkable.  Musculoskeletal: No aggressive or acute appearing osseous lesions. Todd lumbar spine degenerative changes.  IMPRESSION: Circumferential wall thickening of the descending colon with mild surrounding fat stranding. Findings are concerning for colitis with differential considerations including infectious, inflammatory ischemic processes.   Electronically Signed   By: DLovey NewcomerM.D.   On: 11/08/2014 20:16    Review of Systems  Constitutional: Negative.   HENT: Negative.   Eyes: Negative.   Respiratory: Negative.   Cardiovascular: Negative.   Gastrointestinal: Positive for blood in stool. Negative for heartburn, nausea, vomiting, abdominal pain, diarrhea, constipation and melena.  Genitourinary: Negative.   Musculoskeletal: Negative.   Skin: Negative.   Neurological: Negative.   Endo/Heme/Allergies: Negative.   Psychiatric/Behavioral: Negative.     Blood pressure 177/75, pulse 87, temperature 98.3 F (36.8 C), temperature source Oral, resp. rate 13, height _0  (1.702 m), weight 92.171 kg (203 lb 3.2 oz), SpO2 96 %. Physical Exam  Constitutional: He is oriented to person, place, and time. He appears well-developed and well-nourished.  HENT:  Head: Normocephalic and atraumatic.  Mouth/Throat: No oropharyngeal exudate.  Eyes: Conjunctivae and EOM are normal. Pupils are equal, round, and reactive to light. No scleral icterus.  Neck: Normal range of motion. Neck supple. No JVD  present. No tracheal deviation present. No thyromegaly present.  Cardiovascular: Normal rate and regular rhythm.  Exam reveals no gallop and no friction rub.   No murmur heard. Respiratory: Effort normal and breath sounds normal. No respiratory distress. He has no wheezes. He has no rales.  GI: Soft. Bowel sounds are normal. He exhibits no distension. There is no tenderness. There is no rebound and no guarding.  Musculoskeletal: Normal range of motion. He exhibits no edema or tenderness.  Lymphadenopathy:    He has no cervical adenopathy.  Neurological: He is alert and oriented to person, place, and time. He has normal reflexes. No cranial nerve deficit.  Coordination normal.  Skin: Skin is warm and dry. No rash noted. No erythema. No pallor.  Psychiatric: He has a normal mood and affect. His behavior is normal. Judgment and thought content normal.     Assessment/Plan Colitis Pt will be started on iv cipro, and iv flagyl Blood culture x2, check esr,  NPO Please obtain GI consultation in am to see if needs colonscopy for rectal bleeding  Rectal bleeding Check serial cbc  CAD Cont current medications.  Hold aspirin in am  DVT proophylaxis: scd     Jani Gravel 11/08/2014, 9:38 PM

## 2014-11-08 NOTE — ED Notes (Signed)
Pt c/o intermittent  Lower abdominal pain cramping. Reports waking up this morning with mild cramping. Had one episode of bright red stool with diaphoresis. Pt denies complaints at this time.

## 2014-11-09 DIAGNOSIS — K529 Noninfective gastroenteritis and colitis, unspecified: Principal | ICD-10-CM

## 2014-11-09 DIAGNOSIS — K625 Hemorrhage of anus and rectum: Secondary | ICD-10-CM

## 2014-11-09 LAB — CBC WITH DIFFERENTIAL/PLATELET
BASOS PCT: 0 % (ref 0–1)
Basophils Absolute: 0 10*3/uL (ref 0.0–0.1)
Eosinophils Absolute: 0.1 10*3/uL (ref 0.0–0.7)
Eosinophils Relative: 2 % (ref 0–5)
HEMATOCRIT: 41.2 % (ref 39.0–52.0)
Hemoglobin: 13.6 g/dL (ref 13.0–17.0)
LYMPHS ABS: 2.1 10*3/uL (ref 0.7–4.0)
Lymphocytes Relative: 28 % (ref 12–46)
MCH: 30.2 pg (ref 26.0–34.0)
MCHC: 33 g/dL (ref 30.0–36.0)
MCV: 91.6 fL (ref 78.0–100.0)
MONOS PCT: 10 % (ref 3–12)
Monocytes Absolute: 0.8 10*3/uL (ref 0.1–1.0)
Neutro Abs: 4.4 10*3/uL (ref 1.7–7.7)
Neutrophils Relative %: 60 % (ref 43–77)
Platelets: 193 10*3/uL (ref 150–400)
RBC: 4.5 MIL/uL (ref 4.22–5.81)
RDW: 12.8 % (ref 11.5–15.5)
WBC: 7.4 10*3/uL (ref 4.0–10.5)

## 2014-11-09 LAB — COMPREHENSIVE METABOLIC PANEL
ALK PHOS: 83 U/L (ref 39–117)
ALT: 21 U/L (ref 0–53)
AST: 21 U/L (ref 0–37)
Albumin: 3.1 g/dL — ABNORMAL LOW (ref 3.5–5.2)
Anion gap: 8 (ref 5–15)
BUN: 7 mg/dL (ref 6–23)
CO2: 25 mmol/L (ref 19–32)
Calcium: 8.7 mg/dL (ref 8.4–10.5)
Chloride: 106 mmol/L (ref 96–112)
Creatinine, Ser: 0.89 mg/dL (ref 0.50–1.35)
GFR calc non Af Amer: 87 mL/min — ABNORMAL LOW (ref >90)
GLUCOSE: 92 mg/dL (ref 70–99)
Potassium: 3.7 mmol/L (ref 3.5–5.1)
SODIUM: 139 mmol/L (ref 135–145)
Total Bilirubin: 1.2 mg/dL (ref 0.3–1.2)
Total Protein: 6.1 g/dL (ref 6.0–8.3)

## 2014-11-09 LAB — PROTIME-INR
INR: 1.12 (ref 0.00–1.49)
PROTHROMBIN TIME: 14.6 s (ref 11.6–15.2)

## 2014-11-09 LAB — APTT: aPTT: 35 seconds (ref 24–37)

## 2014-11-09 NOTE — Plan of Care (Signed)
Problem: Phase I Progression Outcomes Goal: OOB as tolerated unless otherwise ordered Outcome: Completed/Met Date Met:  11/09/14 To bathroom

## 2014-11-09 NOTE — Progress Notes (Signed)
[facility] PROGRESS NOTE  Brian Todd [id number]:[id number] DOB: [birth year] DOA: 11/08/2014 PCP: [name], MD  Assessment/Plan:  Active Problems:   Rectal bleeding - subsiding - hgb levels within normal limits - in context of patient diagnosed with colitis    Colitis - Pt on cipro and flagyl. Will continue - leukocytosis resolved on this - Pt afebrile - Patient condition stable. His rectal bleeding is subsiding and his hemoglobin levels within normal limits. Given active infection and lack of urgency I believe the patient can follow-up with gastroenterologist after infection and inflammation has resolved  Code Status: full Family Communication: discussed with patient and family at bedside Disposition Plan: Pending improvement in condition   Consultants:  None  Procedures:  None  Antibiotics:  Please see above  HPI/Subjective: Patient reports feeling better. He states he saw a little bit of blood in the bowel movement today. Patient requesting advancement of diet  Objective: Filed Vitals:   11/09/14 1353  BP: 135/67  Pulse: 60  Temp: 98 F (36.7 C)  Resp: 16    Intake/Output Summary (Last 24 hours) at 11/09/14 1743 Last data filed at 11/09/14 1616  Gross per 24 hour  Intake 2327.42 ml  Output   1765 ml  Net 562.42 ml   Filed Weights   11/08/14 1502 11/08/14 2230 11/09/14 0422  Weight: 92.171 kg (203 lb 3.2 oz) 90.4 kg (199 lb 4.7 oz) 90.855 kg (200 lb 4.8 oz)    Exam:   General:  Patient in no acute distress, alert and awake  Cardiovascular: Regular rate and rhythm, no murmurs or rubs  Respiratory: Clear to auscultation bilaterally, no wheezes  Abdomen: Soft, nondistended, no rebound tenderness  Musculoskeletal: No cyanosis or clubbing   Data Reviewed: Basic Metabolic Panel:  Recent Labs Lab 11/08/14 1729 11/09/14 0633  NA 139 139  K 4.0 3.7  CL 105 106  CO2 22 25  GLUCOSE 94 92  BUN 11 7  CREATININE 0.94 0.89  CALCIUM 9.2  8.7   Liver Function Tests:  Recent Labs Lab 11/08/14 1729 11/09/14 0633  AST 26 21  ALT 25 21  ALKPHOS 93 83  BILITOT 0.9 1.2  PROT 7.2 6.1  ALBUMIN 3.9 3.1*    Recent Labs Lab 11/08/14 1729  LIPASE 24   No results for input(s): AMMONIA in the last 168 hours. CBC:  Recent Labs Lab 11/08/14 1729 11/09/14 0633  WBC 12.3* 7.4  NEUTROABS 9.4* 4.4  HGB 14.5 13.6  HCT 42.8 41.2  MCV 89.7 91.6  PLT 203 193   Cardiac Enzymes: No results for input(s): CKTOTAL, CKMB, CKMBINDEX, TROPONINI in the last 168 hours. BNP (last 3 results) No results for input(s): BNP in the last 8760 hours.  ProBNP (last 3 results) No results for input(s): PROBNP in the last 8760 hours.  CBG: No results for input(s): GLUCAP in the last 168 hours.  No results found for this or any previous visit (from the past 240 hour(s)).   Studies: Ct Abdomen Pelvis W Contrast  11/08/2014   CLINICAL DATA:  Patient with lower abdominal cramping. Blood during bowel movement.  EXAM: CT ABDOMEN AND PELVIS WITH CONTRAST  TECHNIQUE: Multidetector CT imaging of the abdomen and pelvis was performed using the standard protocol following bolus administration of intravenous contrast.  CONTRAST:  158mL OMNIPAQUE IOHEXOL 300 MG/ML  SOLN  COMPARISON:  None.  FINDINGS: Lower chest: No consolidative or nodular pulmonary opacities. Minimal atelectasis left lung base. Normal heart size.  Hepatobiliary: Liver is  normal in size and contour. No focal hepatic lesion is identified. Gallbladder is unremarkable. No intrahepatic or extrahepatic biliary ductal dilatation.  Pancreas: Unremarkable  Spleen: Unremarkable  Adrenals/Urinary Tract: Normal adrenal glands. Kidneys enhance symmetrically with contrast. No ureterolithiasis. Urinary bladder is unremarkable.  Stomach/Bowel: Sigmoid colonic diverticulosis. There is circumferential wall thickening of the descending colon (image 42; series 201). The appendix is normal. No evidence for small  bowel obstruction. No free fluid or free intraperitoneal air.  Vascular/Lymphatic: Normal caliber abdominal aorta. No retroperitoneal lymphadenopathy.  Other: Small fat containing left inguinal hernia. Prostate unremarkable.  Musculoskeletal: No aggressive or acute appearing osseous lesions. Lower lumbar spine degenerative changes.  IMPRESSION: Circumferential wall thickening of the descending colon with mild surrounding fat stranding. Findings are concerning for colitis with differential considerations including infectious, inflammatory ischemic processes.   Electronically Signed   By: Lovey Newcomer M.D.   On: 11/08/2014 20:16    Scheduled Meds: . aspirin EC  81 mg Oral Daily  . atorvastatin  20 mg Oral Daily  . ciprofloxacin  400 mg Intravenous BID  . metoprolol  25 mg Oral BID  . metronidazole  500 mg Intravenous 3 times per day  . ondansetron  4 mg Intravenous Once  . pantoprazole  40 mg Oral Daily  . ramipril  10 mg Oral Daily  . sodium chloride  3 mL Intravenous Q12H   Continuous Infusions:    Time spent: > 35 minutes    Velvet Bathe  [facility] Pager [phone]. If 7PM-7AM, please contact night-coverage at [email or web address], password [facility] 11/09/2014, 5:43 PM  LOS: 1 day

## 2014-11-09 NOTE — Progress Notes (Signed)
Patient arrived via stretcher from the emergency room, alert and oriented x 4, accompanied by wife.  Admitted for bloody stools (?colitis).  Skin intact, IV in LAC with NS @ 100.  Placed on telemetry, box 14, NSR.  NPO.  Will continue to monitor.  Wilson Singer, RN

## 2014-11-09 NOTE — Plan of Care (Signed)
Problem: Phase I Progression Outcomes Goal: Initial discharge plan identified Outcome: Completed/Met Date Met:  11/09/14 To return home with wife

## 2014-11-10 ENCOUNTER — Telehealth: Payer: Self-pay

## 2014-11-10 ENCOUNTER — Encounter: Payer: Self-pay | Admitting: Gastroenterology

## 2014-11-10 LAB — CBC
HCT: 40.4 % (ref 39.0–52.0)
Hemoglobin: 13.5 g/dL (ref 13.0–17.0)
MCH: 30.5 pg (ref 26.0–34.0)
MCHC: 33.4 g/dL (ref 30.0–36.0)
MCV: 91.4 fL (ref 78.0–100.0)
Platelets: 189 10*3/uL (ref 150–400)
RBC: 4.42 MIL/uL (ref 4.22–5.81)
RDW: 12.9 % (ref 11.5–15.5)
WBC: 7.3 10*3/uL (ref 4.0–10.5)

## 2014-11-10 MED ORDER — CIPROFLOXACIN HCL 500 MG PO TABS: 500.0000 mg | ORAL_TABLET | Freq: Two times a day (BID) | ORAL | Status: DC

## 2014-11-10 MED ORDER — METRONIDAZOLE 500 MG PO TABS: 500.0000 mg | ORAL_TABLET | Freq: Three times a day (TID) | ORAL | Status: DC

## 2014-11-10 NOTE — Telephone Encounter (Signed)
He was admitted to the hosp-will follow

## 2014-11-10 NOTE — Progress Notes (Signed)
NURSING PROGRESS NOTE  Brian Todd [id number] Discharge Data: 11/10/2014 10:53 AM Attending Provider: Velvet Bathe, MD LTR:VUYEB Tower, MD     Valli Glance to be D/C'd Home per MD order.  Discussed with the patient the After Visit Summary and all questions fully answered. All IV's discontinued with no bleeding noted. All belongings returned to patient for patient to take home.   Last Vital Signs:  Blood pressure 150/68, pulse 84, temperature 98.1 F (36.7 C), temperature source Oral, resp. rate 15, height 5' 8.4" (1.737 m), weight 91.354 kg (201 lb 6.4 oz), SpO2 97 %.  Discharge Medication List   Medication List    STOP taking these medications        aspirin 81 MG tablet      TAKE these medications        atorvastatin 20 MG tablet  Commonly known as:  LIPITOR  Take 1 tablet (20 mg total) by mouth daily.     ciprofloxacin 500 MG tablet  Commonly known as:  CIPRO  Take 1 tablet (500 mg total) by mouth 2 (two) times daily.     esomeprazole 20 MG capsule  Commonly known as:  NEXIUM  Take 2 capsules (40 mg total) by mouth daily at 12 noon. Two 20 mg tabs otc daily     Krill Oil 1000 MG Caps  Take 1 capsule by mouth daily.     metoprolol 50 MG tablet  Commonly known as:  LOPRESSOR  TAKE 1/2 TABLET BY MOUTH 2 TIMES DAILY     metroNIDAZOLE 500 MG tablet  Commonly known as:  FLAGYL  Take 1 tablet (500 mg total) by mouth 3 (three) times daily.     MULTIVITAMIN & MINERAL PO  Take 1 tablet by mouth daily.     nitroGLYCERIN 0.4 MG SL tablet  Commonly known as:  NITROSTAT  PLACE 1 TABLET UNDER THE TONGUE EVERY 5 MINS AS NEEDED     ramipril 10 MG capsule  Commonly known as:  ALTACE  Take 1 capsule (10 mg total) by mouth daily.

## 2014-11-10 NOTE — Discharge Summary (Signed)
Physician Discharge Summary  Brian Todd [id number]:[id number] DOB: [birth year] DOA: 11/08/2014  PCP: [name], MD  Admit date: 11/08/2014 Discharge date: 11/10/2014  Time spent: > 35 minutes  Recommendations for Outpatient Follow-up:  1. Reassess CBC levels 2. Recommended patient follow-up with gastroenterologist after discharge. Laced order for secretary to schedule appointment 3. Held aspirin  Discharge Diagnoses:  Active Problems:   Rectal bleeding   Colitis   Discharge Condition: Stable  Diet recommendation: Low sodium  Filed Weights   11/08/14 2230 11/09/14 0422 11/10/14 0439  Weight: 90.4 kg (199 lb 4.7 oz) 90.855 kg (200 lb 4.8 oz) 91.354 kg (201 lb 6.4 oz)    History of present illness:  69 year old Caucasian male that presented with rectal bleeding and was found to have colitis on CT scan  Hospital Course:  Colitis - We'll discharge on antibiotics to complete a total course of 10 days. Patient will go home on Cipro and Flagyl - Recommended he follow-up with gastroenterologist within the next 1-2 weeks for further evaluation. Also recommended should he have any significant fluid increase in bleeding from his rectum he is to come back to the hospital for evaluation of which patient verbalizes understanding and agreement.   Procedures:  None  Consultations:  None  Discharge Exam: Filed Vitals:   11/10/14 0522  BP: 150/68  Pulse:   Temp: 98.1 F (36.7 C)  Resp: 15    General: Patient in no acute distress, alert and awake Cardiovascular: Regular rate and rhythm, no murmurs or rubs Respiratory: Clear to auscultation bilaterally, no wheezes  Discharge Instructions   Discharge Instructions    Call MD for:  extreme fatigue    Complete by:  As directed      Call MD for:  temperature >100.4    Complete by:  As directed      Diet - low sodium heart healthy    Complete by:  As directed      Discharge instructions    Complete by:  As directed   Please  discontinue taking your aspirin. Follow-up with GI for further evaluation and recommendations. Should you have any unexpected bleeding from your rectum please come to the hospital for further evaluation.     Increase activity slowly    Complete by:  As directed           Current Discharge Medication List    START taking these medications   Details  ciprofloxacin (CIPRO) 500 MG tablet Take 1 tablet (500 mg total) by mouth 2 (two) times daily. Qty: 16 tablet, Refills: 0    metroNIDAZOLE (FLAGYL) 500 MG tablet Take 1 tablet (500 mg total) by mouth 3 (three) times daily. Qty: 24 tablet, Refills: 0      CONTINUE these medications which have NOT CHANGED   Details  atorvastatin (LIPITOR) 20 MG tablet Take 1 tablet (20 mg total) by mouth daily. Qty: 90 tablet, Refills: 3    esomeprazole (NEXIUM) 20 MG capsule Take 2 capsules (40 mg total) by mouth daily at 12 noon. Two 20 mg tabs otc daily Qty: 90 capsule, Refills: 3    KRILL OIL 1000 MG CAPS Take 1 capsule by mouth daily.    metoprolol (LOPRESSOR) 50 MG tablet TAKE 1/2 TABLET BY MOUTH 2 TIMES DAILY Qty: 90 tablet, Refills: 3    Multiple Vitamins-Minerals (MULTIVITAMIN & MINERAL PO) Take 1 tablet by mouth daily.    ramipril (ALTACE) 10 MG capsule Take 1 capsule (10 mg total) by mouth daily. Qty:  90 capsule, Refills: 3    nitroGLYCERIN (NITROSTAT) 0.4 MG SL tablet PLACE 1 TABLET UNDER THE TONGUE EVERY 5 MINS AS NEEDED Qty: 25 tablet, Refills: 1      STOP taking these medications     aspirin 81 MG tablet        No Known Allergies    The results of significant diagnostics from this hospitalization (including imaging, microbiology, ancillary and laboratory) are listed below for reference.    Significant Diagnostic Studies: Ct Abdomen Pelvis W Contrast  11/08/2014   CLINICAL DATA:  Patient with lower abdominal cramping. Blood during bowel movement.  EXAM: CT ABDOMEN AND PELVIS WITH CONTRAST  TECHNIQUE: Multidetector CT  imaging of the abdomen and pelvis was performed using the standard protocol following bolus administration of intravenous contrast.  CONTRAST:  12mL OMNIPAQUE IOHEXOL 300 MG/ML  SOLN  COMPARISON:  None.  FINDINGS: Lower chest: No consolidative or nodular pulmonary opacities. Minimal atelectasis left lung base. Normal heart size.  Hepatobiliary: Liver is normal in size and contour. No focal hepatic lesion is identified. Gallbladder is unremarkable. No intrahepatic or extrahepatic biliary ductal dilatation.  Pancreas: Unremarkable  Spleen: Unremarkable  Adrenals/Urinary Tract: Normal adrenal glands. Kidneys enhance symmetrically with contrast. No ureterolithiasis. Urinary bladder is unremarkable.  Stomach/Bowel: Sigmoid colonic diverticulosis. There is circumferential wall thickening of the descending colon (image 42; series 201). The appendix is normal. No evidence for small bowel obstruction. No free fluid or free intraperitoneal air.  Vascular/Lymphatic: Normal caliber abdominal aorta. No retroperitoneal lymphadenopathy.  Other: Small fat containing left inguinal hernia. Prostate unremarkable.  Musculoskeletal: No aggressive or acute appearing osseous lesions. Lower lumbar spine degenerative changes.  IMPRESSION: Circumferential wall thickening of the descending colon with mild surrounding fat stranding. Findings are concerning for colitis with differential considerations including infectious, inflammatory ischemic processes.   Electronically Signed   By: Lovey Newcomer M.D.   On: 11/08/2014 20:16    Microbiology: No results found for this or any previous visit (from the past 240 hour(s)).   Labs: Basic Metabolic Panel:  Recent Labs Lab 11/08/14 1729 11/09/14 0633  NA 139 139  K 4.0 3.7  CL 105 106  CO2 22 25  GLUCOSE 94 92  BUN 11 7  CREATININE 0.94 0.89  CALCIUM 9.2 8.7   Liver Function Tests:  Recent Labs Lab 11/08/14 1729 11/09/14 0633  AST 26 21  ALT 25 21  ALKPHOS 93 83  BILITOT  0.9 1.2  PROT 7.2 6.1  ALBUMIN 3.9 3.1*    Recent Labs Lab 11/08/14 1729  LIPASE 24   No results for input(s): AMMONIA in the last 168 hours. CBC:  Recent Labs Lab 11/08/14 1729 11/09/14 0633 11/10/14 0700  WBC 12.3* 7.4 7.3  NEUTROABS 9.4* 4.4  --   HGB 14.5 13.6 13.5  HCT 42.8 41.2 40.4  MCV 89.7 91.6 91.4  PLT 203 193 189   Cardiac Enzymes: No results for input(s): CKTOTAL, CKMB, CKMBINDEX, TROPONINI in the last 168 hours. BNP: BNP (last 3 results) No results for input(s): BNP in the last 8760 hours.  ProBNP (last 3 results) No results for input(s): PROBNP in the last 8760 hours.  CBG: No results for input(s): GLUCAP in the last 168 hours.     Signed:  Velvet Bathe  [facility] 11/10/2014, 9:56 AM

## 2014-11-10 NOTE — Telephone Encounter (Signed)
PLEASE NOTE: All timestamps contained within this report are represented as [place] Standard Time. CONFIDENTIALTY NOTICE: This fax transmission is intended only for the addressee. It contains information that is legally privileged, confidential or otherwise protected from use or disclosure. If you are not the intended recipient, you are strictly prohibited from reviewing, disclosing, copying using or disseminating any of this information or taking any action in reliance on or regarding this information. If you have received this fax in error, please notify us immediately by telephone so that we can arrange for its return to [place]. Phone: [phone], Toll-Free: [phone], Fax: [phone] Page: 1 of 2 Call Id: [id number] [place] Patient Name: Brian Todd Gender: Male DOB: 17-May-1946 Age: 69 Y 20 D Return Phone Number: [phone] (Primary) Address[place] [address] Client [place] Night - Client Client Site [facility] [place], [place] Contact Type Call Call Type Triage / Clinical Caller Name Vaughan Basta Relationship To Patient Spouse Return Phone Number [phone] (Primary) Chief Complaint Blood In Stool Initial Comment Caller states her husband has diarrhea, bleeding from rectum. PreDisposition Go to ED Nurse Assessment Nurse: Germain Osgood, RN, Opal Sidles Date/Time Eilene Ghazi Time): 11/08/2014 1:59:23 PM Confirm and document reason for call. If symptomatic, describe symptoms. ---Caller reports he has had diarrhea x 3 not much each time, following abdominal cramping. Last BM with large amount of blood. Reports was clammy last Night. 7 years ago colonoscopy clear. Hx acid reflux takes OTC Nexium. Has the patient traveled out of the country within the last 30 days? ---No Does the patient require triage? ---Yes Related  visit to physician within the last 2 weeks? ---No Does the PT have any chronic conditions? (i.e. diabetes, asthma, etc.) ---No Heart stent about 15 years ago. Taking enteric coated Baby ASA. Guidelines Guideline Title Affirmed Question Affirmed Notes Nurse Date/Time Eilene Ghazi Time) Rectal Bleeding [1] Large amount of blood AND (2) adult stable Rowan Blase 11/08/2014 2:03:05 PM Disp. Time Eilene Ghazi Time) Disposition Final User 11/08/2014 2:05:07 PM Go to ED Now Yes Germain Osgood, RN, Irving Copas Understands: Yes Disagree/Comply: Comply PLEASE NOTE: All timestamps contained within this report are represented as [place] Standard Time. CONFIDENTIALTY NOTICE: This fax transmission is intended only for the addressee. It contains information that is legally privileged, confidential or otherwise protected from use or disclosure. If you are not the intended recipient, you are strictly prohibited from reviewing, disclosing, copying using or disseminating any of this information or taking any action in reliance on or regarding this information. If you have received this fax in error, please notify us immediately by telephone so that we can arrange for its return to [place]. Phone: [phone], Toll-Free: [phone], Fax: [phone] Page: 2 of 2 Call Id: [id number] Care Advice Given Per Guideline GO TO ED NOW: You need to be seen in the Emergency Department. Go to the ER at ___________ [place] now. Drive carefully. DRIVING: Another adult should drive. * Please bring a list of your current medicines when you go to the Emergency Department (ER). * It is also a good idea to bring the pill bottles too. This will help the doctor to make certain you are taking the right medicines and the right dose. CARE ADVICE given per Rectal Bleeding (Adult) guideline. After Care Instructions Given Call Event Type User Date / Time Description Referrals [facility] - ED

## 2014-11-10 NOTE — Care Management Note (Signed)
    Page 1 of 1   11/10/2014     2:52:35 PM CARE MANAGEMENT NOTE 11/10/2014  Patient:  Brian Todd, Brian Todd   Account Number:  [id number]  Date Initiated:  11/10/2014  Documentation initiated by:  Tomi Bamberger  Subjective/Objective Assessment:   dx gib  admit- lives with spouse, pta indep.     Action/Plan:   Anticipated DC Date:  11/10/2014   Anticipated DC Plan:  [place]  CM consult      Choice offered to / List presented to:             Status of service:  Completed, signed off Medicare Important Message given?  NA - LOS <3 / Initial given by admissions (If response is "NO", the following Medicare IM given date fields will be blank) Date Medicare IM given:   Medicare IM given by:   Date Additional Medicare IM given:   Additional Medicare IM given by:    Discharge Disposition:  HOME/SELF CARE  Per UR Regulation:    If discussed at Long Length of Stay Meetings, dates discussed:    Comments:  11/10/14 [place], BSN [phone] patient for dc today, no needs.

## 2014-11-24 ENCOUNTER — Ambulatory Visit (INDEPENDENT_AMBULATORY_CARE_PROVIDER_SITE_OTHER): Payer: Medicare HMO | Admitting: Physician Assistant

## 2014-11-24 ENCOUNTER — Encounter: Payer: Self-pay | Admitting: Physician Assistant

## 2014-11-24 VITALS — BP 120/80 | HR 68 | Ht 68.0 in | Wt 195.6 lb

## 2014-11-24 DIAGNOSIS — R935 Abnormal findings on diagnostic imaging of other abdominal regions, including retroperitoneum: Secondary | ICD-10-CM

## 2014-11-24 DIAGNOSIS — K625 Hemorrhage of anus and rectum: Secondary | ICD-10-CM | POA: Diagnosis not present

## 2014-11-24 DIAGNOSIS — K529 Noninfective gastroenteritis and colitis, unspecified: Secondary | ICD-10-CM

## 2014-11-24 MED ORDER — NA SULFATE-K SULFATE-MG SULF 17.5-3.13-1.6 GM/177ML PO SOLN
1.0000 | Freq: Once | ORAL | Status: DC
Start: 2014-11-24 — End: 2014-12-09

## 2014-11-24 NOTE — Progress Notes (Signed)
Patient ID: Brian Todd, male   DOB: [birth year], 69 y.o.   MRN: [id number]   Subjective:    Patient ID: Brian Todd, male    DOB: 1945/10/26, 69 y.o.   MRN: [id number]  HPI Brian Todd is a pleasant 69 year old white male known remotely to Dr. Deatra Todd from colonoscopy done in 2007. This was a normal exam. Patient had a recent hospitalization for 16 through 11/10/2014 with an acute left-sided colitis. He was on the hospitalist service and was not seen by GI. CT of the abdomen and pelvis was done on admission which did show an acute left-sided colitis sigmoid diverticulosis etiology of colitis questioned infectious versus inflammatory. Patient was treated with a course of Cipro and Flagyl which she has now completed. Patient relates that he had acute onset of his symptoms at about 1 AM waking him from sleep with lower abdominal cramping and urge for bowel movement. He says he got up and had a bowel movement and had diffuse diaphoresis. No nausea or vomiting. He did not look at that time and is unaware if he passed blood. Several hours later the following morning he continued to have lower abdominal crampy discomfort which she says was not severe and then had 2 more episodes of bloody bowel movements through the day. He went to the emergency room for evaluation. Patient says he early was not passing any more blood by the time he was discharged home and has not seen any blood since. He says he feels better has no complaints of abdominal pain or cramping appetite is fine and bowel movements are back to normal. Brian Todd He had not been placed on any new medications or had any adjustments in his medications or recent antibiotics. He does have hypertension and is on 2 antihypertensives history of coronary artery disease with remote stenting, GERD, hyperlipidemia, degenerative joint disease IBS and is status post cholecystectomy. Review of Systems Pertinent positive and negative review of systems were noted in the  above HPI section.  All other review of systems was otherwise negative.  Outpatient Encounter Prescriptions as of 11/24/2014  . Order #: [id number] Class: Normal  . Order #: [id number] Class: Normal  . Order #: [id number] Class: Historical Med  . Order #: [id number] Class: Normal  . Order #: [id number] Class: Historical Med  . Order #: [id number] Class: Normal  . Order #: [id number] Class: Normal  . Order #: [id number] Class: Normal  . [DISCONTINUED] Order #: [id number] Class: Print  . [DISCONTINUED] Order #: [id number] Class: Print   No Known Allergies Patient Active Problem List   Diagnosis Date Noted  . Rectal bleeding 11/08/2014  . Colitis 11/08/2014  . Encounter for Medicare annual wellness exam 10/31/2014  . UTI (urinary tract infection) 04/28/2014  . Contact dermatitis 01/28/2013  . Tinea pedis 01/28/2013  . Physical exam, annual 09/14/2012  . DEGENERATIVE JOINT DISEASE 07/30/2009  . PSA, INCREASED 07/30/2009  . HEARING LOSS, MILD 11/27/2007  . CORONARY ARTERY DISEASE 11/27/2007  . IRRITABLE BOWEL SYNDROME 11/27/2007  . GALLBLADDER DISEASE 11/27/2007  . Hyperlipidemia 08/13/2007  . Essential hypertension 08/13/2007  . GERD 08/13/2007   History   Social History  . Marital Status: Married    Spouse Name: Brian Todd  . Number of Children: 2  . Years of Education: N/A   Occupational History  . engineering     Retired   Social History Main Topics  . Smoking status: Never Smoker   . Smokeless tobacco: Never Used  .  Alcohol Use: No     Comment: rare use  . Drug Use: No  . Sexual Activity: Not on file   Other Topics Concern  . Not on file   Social History Narrative    [name] family history includes Stroke in his mother.      Objective:    Filed Vitals:   11/24/14 1023  BP: 120/80  Pulse: 68    Physical Exam  well-developed white male in no acute distress, accompanied by his wife blood pressure 120/80 pulse 68 height 5 foot 8 weight 195. HEENT  ;nontraumatic normocephalic EOMI PERRLA sclera anicteric, Supple; no JVD, Cardiovascular; regular rate and rhythm with S1-S2 no murmur or gallop, Pulmonary ;clear bilaterally, Abdomen; soft nontender nondistended bowel sounds are active there is no palpable mass or hepatosplenomegaly, Rectal ;exam not done, Extremities ;no clubbing cyanosis or edema skin warm and dry, Psych ;mood and affect appropriate       Assessment & Plan:   #1  69 yo WM with recent short hospitalization for an acute left-sided colitis which by history is most likely a segmental ischemic colitis. His symptoms have resolved and he is currently asymptomatic. #2 Diverticulosis #3 status post cholecystectomy #4 hypertension #5 coronary artery disease status post remote stent #6 GERD #7 IBS  Plan; Discussion with patient and his wife regarding the natural history of segmental ischemic colitis. He is currently intentionally trying to lose weight and is encouraged to have his blood pressure rechecked if he loses another 10 pounds. Also suggested he keep himself well-hydrated. As last colonoscopy was 9 years ago Will schedule for follow-up colonoscopy to assure no underlying chronic colitis. This will be scheduled with Dr. Deatra Todd. Procedure discussed in detail with the patient and he is agreeable to proceed.  Amy S Esterwood PA-C 11/24/2014   Cc: [place], Wynelle Fanny, MD

## 2014-11-24 NOTE — Patient Instructions (Signed)
You have been scheduled for a colonoscopy. Please follow written instructions given to you at your visit today.  Please pick up your prep supplies at the pharmacy within the next 1-3 days. [place] If you use inhalers (even only as needed), please bring them with you on the day of your procedure. Your physician has requested that you go to www.startemmi.com and enter the access code given to you at your visit today. This web site gives a general overview about your procedure. However, you should still follow specific instructions given to you by our office regarding your preparation for the procedure.

## 2014-11-25 ENCOUNTER — Encounter: Payer: Self-pay | Admitting: Gastroenterology

## 2014-11-25 NOTE — Progress Notes (Signed)
Reviewed and agree with management.  Probable ischemic colitis although infectious colitis is also a possibility. Sandy Salaam. Deatra Ina, M.D., [facility]

## 2014-11-26 ENCOUNTER — Telehealth: Payer: Self-pay | Admitting: *Deleted

## 2014-11-26 NOTE — Telephone Encounter (Signed)
I advised the patient he can pick up the [place] for his colonosocoy. He thanked me for calling.

## 2014-12-09 ENCOUNTER — Encounter: Payer: Self-pay | Admitting: Gastroenterology

## 2014-12-09 ENCOUNTER — Ambulatory Visit (AMBULATORY_SURGERY_CENTER): Payer: Medicare HMO | Admitting: Gastroenterology

## 2014-12-09 VITALS — BP 125/71 | HR 53 | Temp 96.9°F | Resp 18 | Ht 68.0 in | Wt 195.0 lb

## 2014-12-09 DIAGNOSIS — K5730 Diverticulosis of large intestine without perforation or abscess without bleeding: Secondary | ICD-10-CM

## 2014-12-09 DIAGNOSIS — K529 Noninfective gastroenteritis and colitis, unspecified: Secondary | ICD-10-CM | POA: Diagnosis not present

## 2014-12-09 MED ORDER — SODIUM CHLORIDE 0.9 % IV SOLN: 500.0000 mL | INTRAVENOUS | Status: DC

## 2014-12-09 NOTE — Progress Notes (Signed)
  [place] [facility] Post-op Note  Patient: Brian Todd  Procedure(s) Performed: colonoscopy  Patient Location: [facility] - Recovery Area  Anesthesia Type: Deep Sedation/Propofol  Level of Consciousness: awake, oriented and patient cooperative  Airway and Oxygen Therapy: Patient Spontanous Breathing  Post-op Pain: none  Post-op Assessment:  Post-op Vital signs reviewed, Patient's Cardiovascular Status Stable, Respiratory Function Stable, Patent Airway, No signs of Nausea or vomiting and Pain level controlled  Post-op Vital Signs: Reviewed and stable  Complications: No apparent anesthesia complications  Chrsitopher Wik E 9:03 AM

## 2014-12-09 NOTE — Patient Instructions (Signed)
Impression/recommendations:  Diverticulosis (handout given) High Fiber diet (handout given)  Repeat colonoscopy 10 years.  YOU HAD AN ENDOSCOPIC PROCEDURE TODAY AT [facility]:   Refer to the procedure report that was given to you for any specific questions about what was found during the examination.  If the procedure report does not answer your questions, please call your gastroenterologist to clarify.  If you requested that your care partner not be given the details of your procedure findings, then the procedure report has been included in a sealed envelope for you to review at your convenience later.  YOU SHOULD EXPECT: Some feelings of bloating in the abdomen. Passage of more gas than usual.  Walking can help get rid of the air that was put into your GI tract during the procedure and reduce the bloating. If you had a lower endoscopy (such as a colonoscopy or flexible sigmoidoscopy) you may notice spotting of blood in your stool or on the toilet paper. If you underwent a bowel prep for your procedure, you may not have a normal bowel movement for a few days.  Please Note:  You might notice some irritation and congestion in your nose or some drainage.  This is from the oxygen used during your procedure.  There is no need for concern and it should clear up in a day or so.  SYMPTOMS TO REPORT IMMEDIATELY:   Following lower endoscopy (colonoscopy or flexible sigmoidoscopy):  Excessive amounts of blood in the stool  Significant tenderness or worsening of abdominal pains  Swelling of the abdomen that is new, acute  Fever of 100F or higher  For urgent or emergent issues, a gastroenterologist can be reached at any hour by calling [phone].   DIET: Your first meal following the procedure should be a small meal and then it is ok to progress to your normal diet. Heavy or fried foods are harder to digest and may make you feel nauseous or bloated.  Likewise, meals heavy in  dairy and vegetables can increase bloating.  Drink plenty of fluids but you should avoid alcoholic beverages for 24 hours.  ACTIVITY:  You should plan to take it easy for the rest of today and you should NOT DRIVE or use heavy machinery until tomorrow (because of the sedation medicines used during the test).    FOLLOW UP: Our staff will call the number listed on your records the next business day following your procedure to check on you and address any questions or concerns that you may have regarding the information given to you following your procedure. If we do not reach you, we will leave a message.  However, if you are feeling well and you are not experiencing any problems, there is no need to return our call.  We will assume that you have returned to your regular daily activities without incident.  If any biopsies were taken you will be contacted by phone or by letter within the next 1-3 weeks.  Please call us at [phone] if you have not heard about the biopsies in 3 weeks.    SIGNATURES/CONFIDENTIALITY: You and/or your care partner have signed paperwork which will be entered into your electronic medical record.  These signatures attest to the fact that that the information above on your After Visit Summary has been reviewed and is understood.  Full responsibility of the confidentiality of this discharge information lies with you and/or your care-partner.

## 2014-12-09 NOTE — Op Note (Signed)
[facility]  [address] [place], [phone]   COLONOSCOPY PROCEDURE REPORT  PATIENT: Brian, Todd  MR#: [id number] BIRTHDATE: 1945-11-13 , 15  yrs. old GENDER: male ENDOSCOPIST: Inda Castle, MD REFERRED VA:[facility] Vernell Morgans, M.D. PROCEDURE DATE:  12/09/2014 PROCEDURE:   Colonoscopy, diagnostic First Screening Colonoscopy - Avg.  risk and is 50 yrs.  old or older - No.  Prior Negative Screening - Now for repeat screening. Other: See Comments  History of Adenoma - Now for follow-up colonoscopy & has been > or = to 3 yrs.  N/A  Polyps removed today? No Recommend repeat exam, <10 yrs? No ASA CLASS:   Class II INDICATIONS:Inflammatory bowel disease of the intestine if more precise diagnosis or determination of the extent / severity of activity of disease will influence immediate / future management and an abnormal imaging results. recent CT showed left sided coliltis MEDICATIONS: Monitored anesthesia care and Propofol 200 mg IV  DESCRIPTION OF PROCEDURE:   After the risks benefits and alternatives of the procedure were thoroughly explained, informed consent was obtained.  The digital rectal exam revealed no abnormalities of the rectum.   The LB CF-H180AL Loaner [id number] endoscope was introduced through the anus and advanced to the ileum. No adverse events experienced.   The quality of the prep was (Suprep was used) excellent.  The instrument was then slowly withdrawn as the colon was fully examined.      COLON FINDINGS: There was mild diverticulosis noted in the sigmoid colon.   The examination was otherwise normal.  Retroflexed views revealed no abnormalities. The time to cecum = 1.9 Withdrawal time = 6.7   The scope was withdrawn and the procedure completed. COMPLICATIONS: There were no immediate complications.  ENDOSCOPIC IMPRESSION: 1.   Mild diverticulosis was noted in the sigmoid colon 2.   The examination was otherwise normal  Previous colitis  probably secondary to ischemia  RECOMMENDATIONS: Colonoscopy  10 years  eSigned:  Inda Castle, MD 12/09/2014 9:12 AM   cc:   PATIENT NAME:  Brian, Todd MR#: [id number]

## 2014-12-10 ENCOUNTER — Telehealth: Payer: Self-pay | Admitting: *Deleted

## 2014-12-10 NOTE — Telephone Encounter (Signed)
  Follow up Call-  Call back number 12/09/2014  Post procedure Call Back phone  # [phone]  Permission to leave phone message Yes     Patient questions:  Do you have a fever, pain , or abdominal swelling? No. Pain Score  0 *  Have you tolerated food without any problems? Yes.    Have you been able to return to your normal activities? Yes.    Do you have any questions about your discharge instructions: Diet   No. Medications  No. Follow up visit  No.  Do you have questions or concerns about your Care? No.  Actions: * If pain score is 4 or above: No action needed, pain <4.

## 2014-12-31 ENCOUNTER — Telehealth: Payer: Self-pay | Admitting: Family Medicine

## 2014-12-31 NOTE — Telephone Encounter (Signed)
Pt has appt with Dr Deborra Medina on 01/01/2015 at 9 AM.

## 2014-12-31 NOTE — Telephone Encounter (Signed)
Please call to check on pt- it sounds like he might need to be seen before tomorrow.

## 2014-12-31 NOTE — Telephone Encounter (Signed)
Patient Name: Brian Todd  DOB: [birth year]    Initial Comment Caller states he had an uncomfortable feeling in his chest    Nurse Assessment  Nurse: Julien Girt, RN, Almyra Free Date/Time Eilene Ghazi Time): 12/31/2014 11:52:04 AM  Confirm and document reason for call. If symptomatic, describe symptoms. ---Caller states he had a "funny feeling" of discomfort in his chest last night that lasted an hour. Denies pain , pressure or tightness at this time. He was hospitalized 6 weeks ago( April) for an "infected bowel" and experienced similar sx around that time, but thought it was associated with his GI dx. so he did not mention to his physician.  Has the patient traveled out of the country within the last 30 days? ---Not Applicable  Does the patient require triage? ---Yes  Related visit to physician within the last 2 weeks? ---No  Does the PT have any chronic conditions? (i.e. diabetes, asthma, etc.) ---Yes  List chronic conditions. ---Hx "bowel infection with bleeding" 6 weeks ago, cardiac stent 16 yrs ago, htn, high cholesterol     Guidelines    Guideline Title Affirmed Question Affirmed Notes  Chest Pain Chest pain(s) lasting a few seconds (all triage questions negative) occurred on 12/30/14   Final Disposition User   See Physician within [place], RN, Almyra Free    Comments  Upgraded to see PCP within 24 hrs due to "funny feeling in his chest" last evening ( 14+ hrs ago), and hx of cardiac stent 16 yrs ago. Instructed to call back immediately if sx recur at any time, 24/7, or as needed. Mr. Hinners verbalized understanding of all instructions and information

## 2014-12-31 NOTE — Telephone Encounter (Signed)
Spoke to pt who states that he is "feeling ok" and was just wanting to be seen to "make sure everything was ok." No openings available today at [facility]. Pt to keep 06/09 appt

## 2015-01-01 ENCOUNTER — Encounter: Payer: Self-pay | Admitting: Family Medicine

## 2015-01-01 ENCOUNTER — Ambulatory Visit (INDEPENDENT_AMBULATORY_CARE_PROVIDER_SITE_OTHER): Payer: Medicare HMO | Admitting: Family Medicine

## 2015-01-01 ENCOUNTER — Ambulatory Visit: Payer: Self-pay | Admitting: Family Medicine

## 2015-01-01 VITALS — BP 128/62 | HR 62 | Temp 98.0°F | Wt 197.5 lb

## 2015-01-01 DIAGNOSIS — R079 Chest pain, unspecified: Secondary | ICD-10-CM

## 2015-01-01 DIAGNOSIS — G4489 Other headache syndrome: Secondary | ICD-10-CM

## 2015-01-01 DIAGNOSIS — R519 Headache, unspecified: Secondary | ICD-10-CM | POA: Insufficient documentation

## 2015-01-01 DIAGNOSIS — Z955 Presence of coronary angioplasty implant and graft: Secondary | ICD-10-CM

## 2015-01-01 DIAGNOSIS — R51 Headache: Secondary | ICD-10-CM

## 2015-01-01 MED ORDER — ASPIRIN EC 81 MG PO TBEC: 81.0000 mg | DELAYED_RELEASE_TABLET | Freq: Every day | ORAL | Status: AC

## 2015-01-01 NOTE — Progress Notes (Addendum)
Subjective:   Patient ID: Brian Todd, male    DOB: [birth year], 69 y.o.   MRN: [id number]  Brian Todd is a pleasant 69 y.o. year old male pt of Dr. Glori Bickers, new to me, who presents to clinic today with Chest Pain  on 01/01/2015  HPI:  CP- H/o DES stent (Dr. Donna Christen) in 2002. Has been feeling good from cardiac stand point until last week- starting to the get "twinges" of chest pain when he exerts himself, relieved by rest.  Does have rx for NTG but has not taken it.   Does take ASA 81 mg daily, lipitor and metoprolol daily. Associated with some mild DOE.  Very active in yard and chasing his grand kids and has noticed a real significant change in his exercise endurance over past few weeks. He is a non smoker.  Did have colitis a couple of weeks ago- see procedure note - Dr. Deatra Ina, dated 12/09/2014.  Did have an usual headache with visual changes that resolved with in a few hours last week.  No other neurological symptoms.  Has never had anything like that in past and symptoms have not returned.  Current Outpatient Prescriptions on File Prior to Visit  Medication Sig Dispense Refill  . atorvastatin (LIPITOR) 20 MG tablet Take 1 tablet (20 mg total) by mouth daily. 90 tablet 3  . esomeprazole (NEXIUM) 20 MG capsule Take 2 capsules (40 mg total) by mouth daily at 12 noon. Two 20 mg tabs otc daily 90 capsule 3  . KRILL OIL 1000 MG CAPS Take 1 capsule by mouth daily.    . metoprolol (LOPRESSOR) 50 MG tablet TAKE 1/2 TABLET BY MOUTH 2 TIMES DAILY 90 tablet 3  . Multiple Vitamins-Minerals (MULTIVITAMIN & MINERAL PO) Take 1 tablet by mouth daily.    . nitroGLYCERIN (NITROSTAT) 0.4 MG SL tablet PLACE 1 TABLET UNDER THE TONGUE EVERY 5 MINS AS NEEDED 25 tablet 1  . ramipril (ALTACE) 10 MG capsule Take 1 capsule (10 mg total) by mouth daily. 90 capsule 3   No current facility-administered medications on file prior to visit.    No Known Allergies  Past Medical History  Diagnosis Date  .  Hearing loss   . Hypertension   . Coronary artery disease   . Hyperlipidemia   . GERD (gastroesophageal reflux disease)   . IBS (irritable bowel syndrome)   . Gallbladder disease   . Increased prostate specific antigen (PSA) velocity   . DJD (degenerative joint disease)     Past Surgical History  Procedure Laterality Date  . Tonsillectomy and adenoidectomy      in the 2nd grade  . Coronary stent placement      Dr. [name]  . Colonoscopy      Family History  Problem Relation Age of Onset  . Stroke Mother     History   Social History  . Marital Status: Married    Spouse Name: linda wrenn Tyrell  . Number of Children: 2  . Years of Education: N/A   Occupational History  . engineering     Retired   Social History Main Topics  . Smoking status: Never Smoker   . Smokeless tobacco: Never Used  . Alcohol Use: No     Comment: rare use  . Drug Use: No  . Sexual Activity: Yes   Other Topics Concern  . Not on file   Social History Narrative   The PMH, PSH, Social History, Family History, Medications, and allergies  have been reviewed in [facility], and have been updated if relevant.   Review of Systems  Constitutional: Negative.   Eyes: Positive for visual disturbance.  Respiratory: Positive for chest tightness and shortness of breath. Negative for wheezing and stridor.   Cardiovascular: Positive for chest pain. Negative for palpitations and leg swelling.  Gastrointestinal: Negative.   Endocrine: Negative.   Genitourinary: Negative.   Musculoskeletal: Negative.   Skin: Negative.   Allergic/Immunologic: Negative.   Neurological: Positive for headaches.  Hematological: Negative.   Psychiatric/Behavioral: Negative.   All other systems reviewed and are negative.      Objective:    BP 128/62 mmHg  Pulse 62  Temp(Src) 98 F (36.7 C) (Oral)  Wt 197 lb 8 oz (89.585 kg)  SpO2 97%   Physical Exam  Constitutional: He appears well-developed and well-nourished. No  distress.  HENT:  Head: Normocephalic and atraumatic.  Eyes: Conjunctivae are normal.  Neck: Normal range of motion. Neck supple.  Cardiovascular: Normal rate and regular rhythm.   Pulmonary/Chest: Effort normal and breath sounds normal.  Musculoskeletal: Normal range of motion. He exhibits no edema.  Neurological: He displays normal reflexes. No cranial nerve deficit. Coordination normal.  Skin: Skin is warm and dry.  Psychiatric: He has a normal mood and affect. His behavior is normal. Judgment and thought content normal.  Nursing note and vitals reviewed.         Assessment & Plan:   Chest pain, unspecified chest pain type - Plan: EKG 12-Lead, Ambulatory referral to Cardiology  History of heart artery stent - Plan: Ambulatory referral to Cardiology No Follow-up on file.

## 2015-01-01 NOTE — Assessment & Plan Note (Signed)
New- resolved. Explained to Brian Todd that certainly this could have been an occular migraine like he assumed, although he has not had one in past.  But we also cannot rule out TIA and in future, needs to be seen right away when he has symptoms like this.  Advised to also mention this to cardiology. The patient indicates understanding of these issues and agrees with the plan.

## 2015-01-01 NOTE — Progress Notes (Signed)
Pre visit review using our clinic review tool, if applicable. No additional management support is needed unless otherwise documented below in the visit note. 

## 2015-01-01 NOTE — Patient Instructions (Signed)
It was great to meet you. Please stop by to see Rosaria Ferries on your way out.

## 2015-01-01 NOTE — Assessment & Plan Note (Signed)
New- typical in nature and concerning for cardiac source. Refer to cardiology urgently. EKG today reassuring- NSR but cannot rule out an additional blockage given his symptoms aggravated by exertion and relieved by rest.  Unclear if improved with NTG since he has not taken it. Advised to continue ASA, statin and betablocker. The patient indicates understanding of these issues and agrees with the plan.

## 2015-01-02 ENCOUNTER — Ambulatory Visit (INDEPENDENT_AMBULATORY_CARE_PROVIDER_SITE_OTHER): Payer: Medicare HMO | Admitting: Cardiovascular Disease

## 2015-01-02 ENCOUNTER — Other Ambulatory Visit: Payer: Self-pay | Admitting: Cardiovascular Disease

## 2015-01-02 ENCOUNTER — Telehealth: Payer: Self-pay | Admitting: Family Medicine

## 2015-01-02 ENCOUNTER — Encounter: Payer: Self-pay | Admitting: Cardiovascular Disease

## 2015-01-02 DIAGNOSIS — R0789 Other chest pain: Secondary | ICD-10-CM

## 2015-01-02 DIAGNOSIS — Z01810 Encounter for preprocedural cardiovascular examination: Secondary | ICD-10-CM

## 2015-01-02 DIAGNOSIS — I10 Essential (primary) hypertension: Secondary | ICD-10-CM

## 2015-01-02 DIAGNOSIS — I25118 Atherosclerotic heart disease of native coronary artery with other forms of angina pectoris: Secondary | ICD-10-CM

## 2015-01-02 DIAGNOSIS — E785 Hyperlipidemia, unspecified: Secondary | ICD-10-CM

## 2015-01-02 DIAGNOSIS — R079 Chest pain, unspecified: Secondary | ICD-10-CM

## 2015-01-02 MED ORDER — PANTOPRAZOLE SODIUM 40 MG PO TBEC: 40.0000 mg | DELAYED_RELEASE_TABLET | Freq: Every day | ORAL | Status: DC

## 2015-01-02 MED ORDER — CLOPIDOGREL BISULFATE 75 MG PO TABS: 75.0000 mg | ORAL_TABLET | Freq: Every day | ORAL | Status: DC

## 2015-01-02 NOTE — Assessment & Plan Note (Signed)
Blood pressure is mildly elevated today. Continue to monitor.

## 2015-01-02 NOTE — Telephone Encounter (Signed)
Ok. Thanks!

## 2015-01-02 NOTE — Progress Notes (Signed)
Primary care physician: Dr. Glori Bickers  HPI  This is a pleasant 69 year old man who was referred for evaluation of exertional chest pain. He has known history of coronary artery disease. He had a small non-ST elevation myocardial infarction in 2001. Cardiac catheterization showed a 95% proximal LAD stenosis and 40% stenosis in OM branch. He had successful angioplasty and Taxus drug-eluting stent placement to the proximal LAD without complications. He was part of a research study at that time. No cardiac events since then. He reports having a stress test done before his cardiac catheterization which was falsely negative. He has other chronic medical conditions that include hypertension, hyperlipidemia and gastroesophageal reflux disease. Over the last few weeks, he has experienced recurrent substernal chest pain described as burning sensation with no radiation which is mainly exertional with regular everyday activities such as taking the trash out. This has worsened over the last week. The symptoms are very similar to his previous angina. No rest pain. He has been taking his medications regularly.  No Known Allergies   Current Outpatient Prescriptions on File Prior to Visit  Medication Sig Dispense Refill  . aspirin EC 81 MG tablet Take 1 tablet (81 mg total) by mouth daily.    Marland Kitchen atorvastatin (LIPITOR) 20 MG tablet Take 1 tablet (20 mg total) by mouth daily. 90 tablet 3  . KRILL OIL 1000 MG CAPS Take 1 capsule by mouth daily.    . metoprolol (LOPRESSOR) 50 MG tablet TAKE 1/2 TABLET BY MOUTH 2 TIMES DAILY 90 tablet 3  . Multiple Vitamins-Minerals (MULTIVITAMIN & MINERAL PO) Take 1 tablet by mouth daily.    . nitroGLYCERIN (NITROSTAT) 0.4 MG SL tablet PLACE 1 TABLET UNDER THE TONGUE EVERY 5 MINS AS NEEDED 25 tablet 1  . ramipril (ALTACE) 10 MG capsule Take 1 capsule (10 mg total) by mouth daily. 90 capsule 3   No current facility-administered medications on file prior to visit.     Past Medical  History  Diagnosis Date  . Hearing loss   . Hypertension   . Coronary artery disease   . Hyperlipidemia   . GERD (gastroesophageal reflux disease)   . IBS (irritable bowel syndrome)   . Gallbladder disease   . Increased prostate specific antigen (PSA) velocity   . DJD (degenerative joint disease)      Past Surgical History  Procedure Laterality Date  . Tonsillectomy and adenoidectomy      in the 2nd grade  . Coronary stent placement      Dr. [name]  . Colonoscopy    . Cardiac catheterization       Family History  Problem Relation Age of Onset  . Stroke Mother      History   Social History  . Marital Status: Married    Spouse Name: linda wrenn Yamamoto  . Number of Children: 2  . Years of Education: N/A   Occupational History  . engineering     Retired   Social History Main Topics  . Smoking status: Never Smoker   . Smokeless tobacco: Never Used  . Alcohol Use: No     Comment: rare use  . Drug Use: No  . Sexual Activity: Yes   Other Topics Concern  . Not on file   Social History Narrative     ROS A 10 point review of system was performed. It is negative other than that mentioned in the history of present illness.   PHYSICAL EXAM   BP 157/83 mmHg  Pulse 57  Ht 5\' 8"  (1.727 m)  Wt 194 lb 12 oz (88.338 kg)  BMI 29.62 kg/m2 Constitutional: He is oriented to person, place, and time. He appears well-developed and well-nourished. No distress.  HENT: No nasal discharge.  Head: Normocephalic and atraumatic.  Eyes: Pupils are equal and round.  No discharge. Neck: Normal range of motion. Neck supple. No JVD present. No thyromegaly present.  Cardiovascular: Normal rate, regular rhythm, normal heart sounds. Exam reveals no gallop and no friction rub. No murmur heard.  Pulmonary/Chest: Effort normal and breath sounds normal. No stridor. No respiratory distress. He has no wheezes. He has no rales. He exhibits no tenderness.  Abdominal: Soft. Bowel sounds are  normal. He exhibits no distension. There is no tenderness. There is no rebound and no guarding.  Musculoskeletal: Normal range of motion. He exhibits no edema and no tenderness.  Neurological: He is alert and oriented to person, place, and time. Coordination normal.  Skin: Skin is warm and dry. No rash noted. He is not diaphoretic. No erythema. No pallor.  Psychiatric: He has a normal mood and affect. His behavior is normal. Judgment and thought content normal.       EKG: Recent ECG showed normal sinus rhythm with no significant ST or T wave changes.   ASSESSMENT AND PLAN

## 2015-01-02 NOTE — Assessment & Plan Note (Signed)
Lab Results  Component Value Date   CHOL 147 10/24/2014   HDL 36.80* 10/24/2014   LDLCALC 76 10/24/2014   LDLDIRECT 76.5 09/02/2010   TRIG 171.0* 10/24/2014   CHOLHDL 4 10/24/2014   Continue treatment with atorvastatin.

## 2015-01-02 NOTE — Patient Instructions (Addendum)
Stop Nexium.  Start Protonix 40 mg take one tablet daily. Start Plavix 75 mg take one tablet daily.   Need CBC,BMET and PT/INR today. Need a chest x ray at [facility] two days prior to your cardiac cath.   Left cardiac cath is scheduled for January 07, 2015 arrive at 6:30 am at the [facility] admitting.  Please do not take your ramipril the night before or the am of your procedure.  You may take all other medications with water the am of procedure.  Do not eat or drink anything midnight the night before your cardiac cath.    Your physician has requested that you have a cardiac catheterization. Cardiac catheterization is used to diagnose and/or treat various heart conditions. Doctors may recommend this procedure for a number of different reasons. The most common reason is to evaluate chest pain. Chest pain can be a symptom of coronary artery disease (CAD), and cardiac catheterization can show whether plaque is narrowing or blocking your heart's arteries. This procedure is also used to evaluate the valves, as well as measure the blood flow and oxygen levels in different parts of your heart. For further information please visit [email or web address]. Please follow instruction sheet, as given.      Angiogram An angiogram, also called angiography, is a procedure used to look at the blood vessels that carry blood to different parts of your body (arteries). In this procedure, dye is injected through a long, thin tube (catheter) into an artery. X-rays are then taken. The X-rays will show if there is a blockage or problem in a blood vessel.  LET [facility] CARE PROVIDER KNOW ABOUT:  Any allergies you have, including allergies to shellfish or contrast dye.   All medicines you are taking, including vitamins, herbs, eye drops, creams, and over-the-counter medicines.   Previous problems you or members of your family have had with the use of anesthetics.   Any blood disorders you have.   Previous  surgeries you have had.  Any previous kidney problems or failure you have had.  Medical conditions you have.   Possibility of pregnancy, if this applies. RISKS AND COMPLICATIONS Generally, an angiogram is a safe procedure. However, as with any procedure, problems can occur. Possible problems include:  Injury to the blood vessels, including rupture or bleeding.  Infection or bruising at the catheter site.  Allergic reaction to the dye or contrast used.  Kidney damage from the dye or contrast used.  Blood clots that can lead to a stroke or heart attack. BEFORE THE PROCEDURE  Do not eat or drink after midnight on the night before the procedure, or as directed by your health care provider.   Ask your health care provider if you may drink enough water to take any needed medicines the morning of the procedure.  PROCEDURE  You may be given a medicine to help you relax (sedative) before and during the procedure. This medicine is given through an IV access tube that is inserted into one of your veins.   The area where the catheter will be inserted will be washed and shaved. This is usually done in the groin but may be done in the fold of your arm (near your elbow) or in the wrist.  A medicine will be given to numb the area where the catheter will be inserted (local anesthetic).  The catheter will be inserted with a guide wire into an artery. The catheter is guided by using a type of X-ray (fluoroscopy) to  the blood vessel being examined.   Dye is then injected into the catheter, and X-rays are taken. The dye helps to show where any narrowing or blockages are located.  AFTER THE PROCEDURE   If the procedure is done through the leg, you will be kept in bed lying flat for several hours. You will be instructed to not bend or cross your legs.  The insertion site will be checked frequently.  The pulse in your feet or wrist will be checked frequently.  Additional blood tests,  X-rays, and electrocardiography may be done.   You may need to stay in the hospital overnight for observation.  Document Released: 04/20/2005 Document Revised: 07/16/2013 Document Reviewed: 12/12/2012 [facility] Patient Information 2015 [place], [place]. This information is not intended to replace advice given to you by your health care provider. Make sure you discuss any questions you have with your health care provider.

## 2015-01-02 NOTE — Telephone Encounter (Signed)
Needs a cxr before seeing cardiology  Order for Monday- heads up - he will be coming in [place]- I ordered it

## 2015-01-02 NOTE — Assessment & Plan Note (Signed)
The patient's symptoms are highly suggestive of class III angina of recent onset which has been getting worse over the last week. His symptoms are very similar to his presentation in 2001. His EKG does not show any acute changes and he has no rest pain. I discussed with him different management options  including proceeding with a stress test versus directly with cardiac catheterization. Given his known history of coronary artery disease and previous false-negative stress test, the patient prefers proceeding directly with cardiac catheterization which I believe is very reasonable. I discussed the risks of the procedure. I decided to add Plavix 75 mg once daily.

## 2015-01-02 NOTE — Telephone Encounter (Signed)
Opened in error

## 2015-01-03 LAB — CBC WITH DIFFERENTIAL/PLATELET
Basophils Absolute: 0 10*3/uL (ref 0.0–0.2)
Basos: 0 %
EOS (ABSOLUTE): 0.1 10*3/uL (ref 0.0–0.4)
Eos: 2 %
Hematocrit: 42.4 % (ref 37.5–51.0)
Hemoglobin: 13.9 g/dL (ref 12.6–17.7)
Immature Grans (Abs): 0 10*3/uL (ref 0.0–0.1)
Immature Granulocytes: 0 %
Lymphocytes Absolute: 2.1 10*3/uL (ref 0.7–3.1)
Lymphs: 31 %
MCH: 29.7 pg (ref 26.6–33.0)
MCHC: 32.8 g/dL (ref 31.5–35.7)
MCV: 91 fL (ref 79–97)
Monocytes Absolute: 0.7 10*3/uL (ref 0.1–0.9)
Monocytes: 10 %
Neutrophils Absolute: 4 10*3/uL (ref 1.4–7.0)
Neutrophils: 57 %
Platelets: 207 10*3/uL (ref 150–379)
RBC: 4.68 x10E6/uL (ref 4.14–5.80)
RDW: 13.2 % (ref 12.3–15.4)
WBC: 7 10*3/uL (ref 3.4–10.8)

## 2015-01-03 LAB — BASIC METABOLIC PANEL WITH GFR
BUN/Creatinine Ratio: 17 (ref 10–22)
BUN: 15 mg/dL (ref 8–27)
CO2: 22 mmol/L (ref 18–29)
Calcium: 9.5 mg/dL (ref 8.6–10.2)
Chloride: 103 mmol/L (ref 97–108)
Creatinine, Ser: 0.87 mg/dL (ref 0.76–1.27)
GFR calc Af Amer: 103 mL/min/{1.73_m2}
GFR calc non Af Amer: 89 mL/min/{1.73_m2}
Glucose: 99 mg/dL (ref 65–99)
Potassium: 4.7 mmol/L (ref 3.5–5.2)
Sodium: 141 mmol/L (ref 134–144)

## 2015-01-03 LAB — PROTIME-INR
INR: 1 (ref 0.8–1.2)
Prothrombin Time: 10.6 s (ref 9.1–12.0)

## 2015-01-05 ENCOUNTER — Ambulatory Visit (INDEPENDENT_AMBULATORY_CARE_PROVIDER_SITE_OTHER)
Admission: RE | Admit: 2015-01-05 | Discharge: 2015-01-05 | Disposition: A | Discharge status: Home / Self Care | Payer: Medicare HMO | Source: Ambulatory Visit | Attending: Family Medicine | Admitting: Family Medicine

## 2015-01-05 DIAGNOSIS — R079 Chest pain, unspecified: Secondary | ICD-10-CM | POA: Diagnosis not present

## 2015-01-07 ENCOUNTER — Encounter (HOSPITAL_COMMUNITY)
Admission: RE | Disposition: A | Discharge status: Home / Self Care | Payer: Self-pay | Source: Ambulatory Visit | Attending: Cardiovascular Disease

## 2015-01-07 ENCOUNTER — Ambulatory Visit
Admission: RE | Admit: 2015-01-07 | Discharge: 2015-01-08 | Disposition: A | Discharge status: Home / Self Care | Payer: Medicare HMO | Source: Ambulatory Visit | Attending: Cardiovascular Disease | Admitting: Cardiovascular Disease

## 2015-01-07 ENCOUNTER — Encounter (HOSPITAL_COMMUNITY): Payer: Self-pay | Admitting: General Practice

## 2015-01-07 ENCOUNTER — Encounter (HOSPITAL_COMMUNITY)
Admission: RE | Disposition: A | Discharge status: Home / Self Care | Payer: Medicare HMO | Source: Ambulatory Visit | Attending: Cardiovascular Disease

## 2015-01-07 DIAGNOSIS — I10 Essential (primary) hypertension: Secondary | ICD-10-CM | POA: Diagnosis not present

## 2015-01-07 DIAGNOSIS — Z79899 Other long term (current) drug therapy: Secondary | ICD-10-CM | POA: Insufficient documentation

## 2015-01-07 DIAGNOSIS — Z7982 Long term (current) use of aspirin: Secondary | ICD-10-CM | POA: Insufficient documentation

## 2015-01-07 DIAGNOSIS — E785 Hyperlipidemia, unspecified: Secondary | ICD-10-CM | POA: Diagnosis not present

## 2015-01-07 DIAGNOSIS — T82857A Stenosis of cardiac prosthetic devices, implants and grafts, initial encounter: Secondary | ICD-10-CM | POA: Insufficient documentation

## 2015-01-07 DIAGNOSIS — I252 Old myocardial infarction: Secondary | ICD-10-CM | POA: Insufficient documentation

## 2015-01-07 DIAGNOSIS — K219 Gastro-esophageal reflux disease without esophagitis: Secondary | ICD-10-CM | POA: Insufficient documentation

## 2015-01-07 DIAGNOSIS — Z955 Presence of coronary angioplasty implant and graft: Secondary | ICD-10-CM | POA: Diagnosis not present

## 2015-01-07 DIAGNOSIS — I25118 Atherosclerotic heart disease of native coronary artery with other forms of angina pectoris: Secondary | ICD-10-CM

## 2015-01-07 DIAGNOSIS — I208 Other forms of angina pectoris: Secondary | ICD-10-CM | POA: Diagnosis present

## 2015-01-07 DIAGNOSIS — R079 Chest pain, unspecified: Secondary | ICD-10-CM | POA: Diagnosis present

## 2015-01-07 DIAGNOSIS — M1990 Unspecified osteoarthritis, unspecified site: Secondary | ICD-10-CM | POA: Insufficient documentation

## 2015-01-07 DIAGNOSIS — R0789 Other chest pain: Secondary | ICD-10-CM

## 2015-01-07 HISTORY — PX: CORONARY STENT PLACEMENT: SHX1402

## 2015-01-07 HISTORY — PX: CARDIAC CATHETERIZATION: SHX172

## 2015-01-07 HISTORY — DX: Noninfective gastroenteritis and colitis, unspecified: K52.9

## 2015-01-07 LAB — POCT ACTIVATED CLOTTING TIME: ACTIVATED CLOTTING TIME: 644 s

## 2015-01-07 SURGERY — LEFT HEART CATH
Anesthesia: Moderate Sedation

## 2015-01-07 SURGERY — LEFT HEART CATH AND CORONARY ANGIOGRAPHY
Anesthesia: LOCAL

## 2015-01-07 MED ORDER — BIVALIRUDIN BOLUS VIA INFUSION - CUPID
INTRAVENOUS | Status: DC | PRN
  Administered 2015-01-07: 67.05 mg via INTRAVENOUS

## 2015-01-07 MED ORDER — HEPARIN SODIUM (PORCINE) 1000 UNIT/ML IJ SOLN
INTRAMUSCULAR | Status: AC
  Filled 2015-01-07: qty 1

## 2015-01-07 MED ORDER — HEPARIN SODIUM (PORCINE) 1000 UNIT/ML IJ SOLN
INTRAMUSCULAR | Status: DC | PRN
  Administered 2015-01-07: 5000 [IU] via INTRAVENOUS

## 2015-01-07 MED ORDER — SODIUM CHLORIDE 0.9 % IJ SOLN: 3.0000 mL | Freq: Two times a day (BID) | INTRAMUSCULAR | Status: DC

## 2015-01-07 MED ORDER — BIVALIRUDIN 250 MG IV SOLR
250.0000 mg | INTRAVENOUS | Status: DC | PRN
  Administered 2015-01-07: 1.75 mg/kg/h via INTRAVENOUS

## 2015-01-07 MED ORDER — VERAPAMIL HCL 2.5 MG/ML IV SOLN
INTRAVENOUS | Status: DC | PRN
  Administered 2015-01-07: 09:00:00 via INTRA_ARTERIAL

## 2015-01-07 MED ORDER — RAMIPRIL 10 MG PO CAPS
10.0000 mg | ORAL_CAPSULE | Freq: Every day | ORAL | Status: DC
  Administered 2015-01-08: 10 mg via ORAL
  Filled 2015-01-07 (×2): qty 1

## 2015-01-07 MED ORDER — SODIUM CHLORIDE 0.9 % IJ SOLN: 3.0000 mL | INTRAMUSCULAR | Status: DC | PRN

## 2015-01-07 MED ORDER — CLOPIDOGREL BISULFATE 75 MG PO TABS
75.0000 mg | ORAL_TABLET | Freq: Every day | ORAL | Status: DC
  Administered 2015-01-08: 10:00:00 75 mg via ORAL
  Filled 2015-01-07: qty 1

## 2015-01-07 MED ORDER — MIDAZOLAM HCL 2 MG/2ML IJ SOLN
INTRAMUSCULAR | Status: DC | PRN
  Administered 2015-01-07: 1 mg via INTRAVENOUS

## 2015-01-07 MED ORDER — ASPIRIN EC 81 MG PO TBEC
81.0000 mg | DELAYED_RELEASE_TABLET | Freq: Every day | ORAL | Status: DC
  Administered 2015-01-08: 81 mg via ORAL
  Filled 2015-01-07: qty 1

## 2015-01-07 MED ORDER — KRILL OIL 1000 MG PO CAPS: 1.0000 | ORAL_CAPSULE | Freq: Every day | ORAL | Status: DC

## 2015-01-07 MED ORDER — METOPROLOL TARTRATE 12.5 MG HALF TABLET
25.0000 mg | ORAL_TABLET | Freq: Two times a day (BID) | ORAL | Status: DC
  Administered 2015-01-07 – 2015-01-08 (×2): 25 mg via ORAL
  Filled 2015-01-07 (×2): qty 2

## 2015-01-07 MED ORDER — BIVALIRUDIN 250 MG IV SOLR
INTRAVENOUS | Status: AC
  Filled 2015-01-07: qty 250

## 2015-01-07 MED ORDER — FENTANYL CITRATE (PF) 100 MCG/2ML IJ SOLN
INTRAMUSCULAR | Status: DC | PRN
  Administered 2015-01-07: 50 ug via INTRAVENOUS

## 2015-01-07 MED ORDER — HEPARIN (PORCINE) IN NACL 2-0.9 UNIT/ML-% IJ SOLN
INTRAMUSCULAR | Status: AC
  Filled 2015-01-07: qty 1000

## 2015-01-07 MED ORDER — ATORVASTATIN CALCIUM 80 MG PO TABS
80.0000 mg | ORAL_TABLET | Freq: Every day | ORAL | Status: DC
  Administered 2015-01-07: 20:00:00 80 mg via ORAL
  Filled 2015-01-07: qty 1

## 2015-01-07 MED ORDER — PANTOPRAZOLE SODIUM 40 MG PO TBEC
40.0000 mg | DELAYED_RELEASE_TABLET | Freq: Every day | ORAL | Status: DC
  Administered 2015-01-08: 10:00:00 40 mg via ORAL
  Filled 2015-01-07 (×2): qty 1

## 2015-01-07 MED ORDER — LIDOCAINE HCL (PF) 1 % IJ SOLN
INTRAMUSCULAR | Status: AC
  Filled 2015-01-07: qty 30

## 2015-01-07 MED ORDER — SODIUM CHLORIDE 0.9 % IV SOLN: 250.0000 mL | INTRAVENOUS | Status: DC | PRN

## 2015-01-07 MED ORDER — CLOPIDOGREL BISULFATE 300 MG PO TABS
ORAL_TABLET | ORAL | Status: AC
  Filled 2015-01-07: qty 1

## 2015-01-07 MED ORDER — ACETAMINOPHEN 325 MG PO TABS: 650.0000 mg | ORAL_TABLET | ORAL | Status: DC | PRN

## 2015-01-07 MED ORDER — SODIUM CHLORIDE 0.9 % WEIGHT BASED INFUSION: 1.0000 mL/kg/h | INTRAVENOUS | Status: DC

## 2015-01-07 MED ORDER — CLOPIDOGREL BISULFATE 300 MG PO TABS
ORAL_TABLET | ORAL | Status: DC | PRN
  Administered 2015-01-07: 300 mg via ORAL

## 2015-01-07 MED ORDER — NITROGLYCERIN 1 MG/10 ML FOR IR/CATH LAB
INTRA_ARTERIAL | Status: AC
  Filled 2015-01-07: qty 10

## 2015-01-07 MED ORDER — ASPIRIN 81 MG PO CHEW: 81.0000 mg | CHEWABLE_TABLET | ORAL | Status: DC

## 2015-01-07 MED ORDER — IOHEXOL 350 MG/ML SOLN
INTRAVENOUS | Status: DC | PRN
  Administered 2015-01-07: 160 mL via INTRACARDIAC

## 2015-01-07 MED ORDER — SODIUM CHLORIDE 0.9 % WEIGHT BASED INFUSION
3.0000 mL/kg/h | INTRAVENOUS | Status: DC
  Administered 2015-01-07: 3 mL/kg/h via INTRAVENOUS

## 2015-01-07 MED ORDER — FENTANYL CITRATE (PF) 100 MCG/2ML IJ SOLN
INTRAMUSCULAR | Status: AC
  Filled 2015-01-07: qty 2

## 2015-01-07 MED ORDER — MIDAZOLAM HCL 2 MG/2ML IJ SOLN
INTRAMUSCULAR | Status: AC
  Filled 2015-01-07: qty 2

## 2015-01-07 MED ORDER — NITROGLYCERIN 1 MG/10 ML FOR IR/CATH LAB
INTRA_ARTERIAL | Status: DC | PRN
  Administered 2015-01-07: 200 ug

## 2015-01-07 MED ORDER — NITROGLYCERIN 0.4 MG SL SUBL: 0.4000 mg | SUBLINGUAL_TABLET | SUBLINGUAL | Status: DC | PRN

## 2015-01-07 MED ORDER — ONDANSETRON HCL 4 MG/2ML IJ SOLN: 4.0000 mg | Freq: Four times a day (QID) | INTRAMUSCULAR | Status: DC | PRN

## 2015-01-07 MED ORDER — SODIUM CHLORIDE 0.9 % WEIGHT BASED INFUSION: 3.0000 mL/kg/h | INTRAVENOUS | Status: DC

## 2015-01-07 MED ORDER — LIDOCAINE HCL (PF) 1 % IJ SOLN
INTRAMUSCULAR | Status: DC | PRN
  Administered 2015-01-07: 5 mL via INTRADERMAL

## 2015-01-07 MED ORDER — VERAPAMIL HCL 2.5 MG/ML IV SOLN
INTRAVENOUS | Status: AC
  Filled 2015-01-07: qty 2

## 2015-01-07 SURGICAL SUPPLY — 21 items
BALLN EMERGE MR 2.0X12 (BALLOONS) ×2
BALLN ~~LOC~~ TREK RX 2.75X20 (BALLOONS) ×1 IMPLANT
BALLOON EMERGE MR 2.0X12 (BALLOONS) IMPLANT
CATH INFINITI 5FR ANG PIGTAIL (CATHETERS) ×2 IMPLANT
CATH INFINITI 5FR MULTPACK ANG (CATHETERS) IMPLANT
CATH OPTITORQUE JACKY 4.0 5F (CATHETERS) ×2 IMPLANT
CATH VISTA GUIDE 6FR XBLAD3.5 (CATHETERS) ×1 IMPLANT
DEVICE RAD COMP TR BAND LRG (VASCULAR PRODUCTS) ×2 IMPLANT
GLIDESHEATH SLEND SS 6F .021 (SHEATH) ×2 IMPLANT
KIT ENCORE 26 ADVANTAGE (KITS) ×1 IMPLANT
KIT HEART LEFT (KITS) ×2 IMPLANT
PACK CARDIAC CATHETERIZATION (CUSTOM PROCEDURE TRAY) ×2 IMPLANT
SHEATH PINNACLE 5F 10CM (SHEATH) IMPLANT
STENT XIENCE ALPINE RX 2.5X23 (Permanent Stent) ×1 IMPLANT
STOPCOCK MORSE 400PSI 3WAY (MISCELLANEOUS) ×1 IMPLANT
SYR MEDRAD MARK V 150ML (SYRINGE) ×2 IMPLANT
TRANSDUCER W/STOPCOCK (MISCELLANEOUS) ×2 IMPLANT
TUBING CIL FLEX 10 FLL-RA (TUBING) ×2 IMPLANT
WIRE EMERALD 3MM-J .035X150CM (WIRE) IMPLANT
WIRE RUNTHROUGH .014X180CM (WIRE) ×1 IMPLANT
WIRE SAFE-T 1.5MM-J .035X260CM (WIRE) ×2 IMPLANT

## 2015-01-07 NOTE — H&P (View-Only) (Signed)
Primary care physician: Dr. Glori Bickers  HPI  This is a pleasant 69 year old man who was referred for evaluation of exertional chest pain. He has known history of coronary artery disease. He had a small non-ST elevation myocardial infarction in 2001. Cardiac catheterization showed a 95% proximal LAD stenosis and 40% stenosis in OM branch. He had successful angioplasty and Taxus drug-eluting stent placement to the proximal LAD without complications. He was part of a research study at that time. No cardiac events since then. He reports having a stress test done before his cardiac catheterization which was falsely negative. He has other chronic medical conditions that include hypertension, hyperlipidemia and gastroesophageal reflux disease. Over the last few weeks, he has experienced recurrent substernal chest pain described as burning sensation with no radiation which is mainly exertional with regular everyday activities such as taking the trash out. This has worsened over the last week. The symptoms are very similar to his previous angina. No rest pain. He has been taking his medications regularly.  No Known Allergies   Current Outpatient Prescriptions on File Prior to Visit  Medication Sig Dispense Refill  . aspirin EC 81 MG tablet Take 1 tablet (81 mg total) by mouth daily.    Marland Kitchen atorvastatin (LIPITOR) 20 MG tablet Take 1 tablet (20 mg total) by mouth daily. 90 tablet 3  . KRILL OIL 1000 MG CAPS Take 1 capsule by mouth daily.    . metoprolol (LOPRESSOR) 50 MG tablet TAKE 1/2 TABLET BY MOUTH 2 TIMES DAILY 90 tablet 3  . Multiple Vitamins-Minerals (MULTIVITAMIN & MINERAL PO) Take 1 tablet by mouth daily.    . nitroGLYCERIN (NITROSTAT) 0.4 MG SL tablet PLACE 1 TABLET UNDER THE TONGUE EVERY 5 MINS AS NEEDED 25 tablet 1  . ramipril (ALTACE) 10 MG capsule Take 1 capsule (10 mg total) by mouth daily. 90 capsule 3   No current facility-administered medications on file prior to visit.     Past Medical  History  Diagnosis Date  . Hearing loss   . Hypertension   . Coronary artery disease   . Hyperlipidemia   . GERD (gastroesophageal reflux disease)   . IBS (irritable bowel syndrome)   . Gallbladder disease   . Increased prostate specific antigen (PSA) velocity   . DJD (degenerative joint disease)      Past Surgical History  Procedure Laterality Date  . Tonsillectomy and adenoidectomy      in the 2nd grade  . Coronary stent placement      Dr. [name]  . Colonoscopy    . Cardiac catheterization       Family History  Problem Relation Age of Onset  . Stroke Mother      History   Social History  . Marital Status: Married    Spouse Name: linda wrenn Seide  . Number of Children: 2  . Years of Education: N/A   Occupational History  . engineering     Retired   Social History Main Topics  . Smoking status: Never Smoker   . Smokeless tobacco: Never Used  . Alcohol Use: No     Comment: rare use  . Drug Use: No  . Sexual Activity: Yes   Other Topics Concern  . Not on file   Social History Narrative     ROS A 10 point review of system was performed. It is negative other than that mentioned in the history of present illness.   PHYSICAL EXAM   BP 157/83 mmHg  Pulse 57  Ht 5\' 8"  (1.727 m)  Wt 194 lb 12 oz (88.338 kg)  BMI 29.62 kg/m2 Constitutional: He is oriented to person, place, and time. He appears well-developed and well-nourished. No distress.  HENT: No nasal discharge.  Head: Normocephalic and atraumatic.  Eyes: Pupils are equal and round.  No discharge. Neck: Normal range of motion. Neck supple. No JVD present. No thyromegaly present.  Cardiovascular: Normal rate, regular rhythm, normal heart sounds. Exam reveals no gallop and no friction rub. No murmur heard.  Pulmonary/Chest: Effort normal and breath sounds normal. No stridor. No respiratory distress. He has no wheezes. He has no rales. He exhibits no tenderness.  Abdominal: Soft. Bowel sounds are  normal. He exhibits no distension. There is no tenderness. There is no rebound and no guarding.  Musculoskeletal: Normal range of motion. He exhibits no edema and no tenderness.  Neurological: He is alert and oriented to person, place, and time. Coordination normal.  Skin: Skin is warm and dry. No rash noted. He is not diaphoretic. No erythema. No pallor.  Psychiatric: He has a normal mood and affect. His behavior is normal. Judgment and thought content normal.       EKG: Recent ECG showed normal sinus rhythm with no significant ST or T wave changes.   ASSESSMENT AND PLAN

## 2015-01-07 NOTE — Interval H&P Note (Signed)
History and Physical Interval Note:  01/07/2015 8:38 AM  Brian Todd  has presented today for surgery, with the diagnosis of cp  The various methods of treatment have been discussed with the patient and family. After consideration of risks, benefits and other options for treatment, the patient has consented to  Procedure(s): Left Heart Cath and Coronary Angiography (N/A) as a surgical intervention .  The patient's history has been reviewed, patient examined, no change in status, stable for surgery.  I have reviewed the patient's chart and labs.  Questions were answered to the patient's satisfaction.     Kathlyn [place]

## 2015-01-08 DIAGNOSIS — I208 Other forms of angina pectoris: Secondary | ICD-10-CM | POA: Diagnosis not present

## 2015-01-08 DIAGNOSIS — I25118 Atherosclerotic heart disease of native coronary artery with other forms of angina pectoris: Secondary | ICD-10-CM | POA: Diagnosis not present

## 2015-01-08 LAB — BASIC METABOLIC PANEL
Anion gap: 8 (ref 5–15)
BUN: 11 mg/dL (ref 6–20)
CHLORIDE: 104 mmol/L (ref 101–111)
CO2: 27 mmol/L (ref 22–32)
Calcium: 8.9 mg/dL (ref 8.9–10.3)
Creatinine, Ser: 0.95 mg/dL (ref 0.61–1.24)
GFR calc Af Amer: >60 mL/min (ref >60)
GFR calc non Af Amer: >60 mL/min (ref >60)
GLUCOSE: 94 mg/dL (ref 65–99)
Potassium: 4.4 mmol/L (ref 3.5–5.1)
Sodium: 139 mmol/L (ref 135–145)

## 2015-01-08 LAB — HEPATIC FUNCTION PANEL
ALK PHOS: 76 U/L (ref 38–126)
ALT: 26 U/L (ref 17–63)
AST: 23 U/L (ref 15–41)
Albumin: 3.5 g/dL (ref 3.5–5.0)
BILIRUBIN INDIRECT: 1 mg/dL — AB (ref 0.3–0.9)
BILIRUBIN TOTAL: 1.2 mg/dL (ref 0.3–1.2)
Bilirubin, Direct: 0.2 mg/dL (ref 0.1–0.5)
Total Protein: 6.3 g/dL — ABNORMAL LOW (ref 6.5–8.1)

## 2015-01-08 LAB — CBC
HCT: 41.4 % (ref 39.0–52.0)
HEMOGLOBIN: 13.9 g/dL (ref 13.0–17.0)
MCH: 30.3 pg (ref 26.0–34.0)
MCHC: 33.6 g/dL (ref 30.0–36.0)
MCV: 90.4 fL (ref 78.0–100.0)
PLATELETS: 184 10*3/uL (ref 150–400)
RBC: 4.58 MIL/uL (ref 4.22–5.81)
RDW: 12.7 % (ref 11.5–15.5)
WBC: 8 10*3/uL (ref 4.0–10.5)

## 2015-01-08 MED ORDER — ATORVASTATIN CALCIUM 80 MG PO TABS: 80.0000 mg | ORAL_TABLET | Freq: Every day | ORAL | Status: DC

## 2015-01-08 MED ORDER — NITROGLYCERIN 0.4 MG SL SUBL: 0.4000 mg | SUBLINGUAL_TABLET | SUBLINGUAL | Status: DC | PRN

## 2015-01-08 MED FILL — Heparin Sodium (Porcine) 2 Unit/ML in Sodium Chloride 0.9%: INTRAMUSCULAR | Qty: 1000 | Status: AC

## 2015-01-08 NOTE — Progress Notes (Signed)
Patient Name: Brian Todd Date of Encounter: 01/08/2015   SUBJECTIVE  Denies chest pain, SOB or palpitation.   CURRENT MEDS . aspirin EC  81 mg Oral Daily  . atorvastatin  80 mg Oral q1800  . clopidogrel  75 mg Oral Daily  . metoprolol  25 mg Oral BID  . pantoprazole  40 mg Oral Daily  . ramipril  10 mg Oral Daily  . sodium chloride  3 mL Intravenous Q12H    OBJECTIVE  Filed Vitals:   01/07/15 1316 01/07/15 1748 01/07/15 2230 01/08/15 0037  BP: 113/65 156/62 132/64   Pulse: 55 55 67   Temp: 98 F (36.7 C) 98 F (36.7 C) 98.4 F (36.9 C)   TempSrc: Oral Oral Oral   Resp: _0 Height:      Weight:    194 lb 10.7 oz (88.3 kg)  SpO2: 98% 98% 96%     Intake/Output Summary (Last 24 hours) at 01/08/15 0715 Last data filed at 01/07/15 2235  Gross per 24 hour  Intake    240 ml  Output   1200 ml  Net   -960 ml   Filed Weights   01/07/15 0722 01/08/15 0037  Weight: 197 lb (89.359 kg) 194 lb 10.7 oz (88.3 kg)    PHYSICAL EXAM  General: Pleasant, NAD. Neuro: Alert and oriented X 3. Moves all extremities spontaneously. Psych: Normal affect. HEENT:  Normal  Neck: Supple without bruits or JVD. Lungs:  Resp regular and unlabored, CTA. Heart: RRR no s3, s4, or murmurs. Abdomen: Soft, non-tender, non-distended, BS + x 4.  Extremities: No clubbing, cyanosis or edema. DP/PT/Radials 2+ and equal bilaterally. Right radial cath site without erythema, hematoma or bruit.   Accessory Clinical Findings  CBC  Recent Labs  01/08/15 0523  WBC 8.0  HGB 13.9  HCT 41.4  MCV 90.4  PLT 376   Basic Metabolic Panel  Recent Labs  01/08/15 0523  NA 139  K 4.4  CL 104  CO2 27  GLUCOSE 94  BUN 11  CREATININE 0.95  CALCIUM 8.9   Liver Function Tests  Recent Labs  01/08/15 0523  AST 23  ALT 26  ALKPHOS 76  BILITOT 1.2  PROT 6.3*  ALBUMIN 3.5    TELE  NSR  Cath 01/07/15 Conclusion     Ost 2nd Diag to 2nd Diag lesion, 40% stenosed.  Ost 2nd  Mrg to 2nd Mrg lesion, 50% stenosed.  Mid Cx lesion, 60% stenosed.  Mid RCA to Dist RCA lesion, 20% stenosed.  The left ventricular systolic function is normal.  Mid LAD lesion, 99% stenosed. There is a 0% residual stenosis post intervention. A drug-eluting stent was placed. The lesion was previously treated with a drug-eluting stent greater than two years ago.  A drug-eluting stent was placed.  1. Severe one-vessel coronary artery disease with 99% in-stent restenosis in the mid LAD extending to the distal edge. There is moderate mid left circumflex stenosis at the bifurcation of OM 2/ OM 3. 2. Normal LV systolic function. Normal left ventricular end-diastolic pressure.  Recommendations: Dual platelet therapy for at least one year and preferably longer given that he has a stent inside a stent. aggressive treatment of risk factors. the left circumflex stenosis can likely be treated medically unless he has refractory angina.      Coronary Findings    Dominance: Right   Left Main  The vessel is angiographically normal.     Left Anterior  Descending   . Dist LAD lesion, 99% stenosed. The lesion was previously treated with a drug-eluting stent greater than two years ago.   Marland Kitchen PCI: The pre-interventional distal flow is normal (TIMI 3). Pre-stent angioplasty was performed. A drug-eluting stent was placed. The strut is apposed. Post-stent angioplasty was performed. Maximum pressure: 14 atm. The post-interventional distal flow is normal (TIMI 3). The intervention was successful. No complications occurred at this lesion.  . There is no residual stenosis post intervention.     . First Diagonal Branch   The vessel is small in size and exhibits minimal luminal irregularities.   . Second Diagonal Branch   The vessel is moderate in size. There is mild.   Colon Flattery 2nd Diag to 2nd Diag lesion, 40% stenosed.     Left Circumflex   . Mid Cx lesion, 60% stenosed.   . First Obtuse Marginal Branch     The vessel is small in size. There is mild.   . Second Obtuse Marginal Branch   . Ost 2nd Mrg to 2nd Mrg lesion, 50% stenosed.     Right Coronary Artery   . Mid RCA to Dist RCA lesion, 20% stenosed.   . Right Posterior Descending Artery   The vessel is moderate in size and is angiographically normal.   . Right Posterior Atrioventricular Branch   The vessel is angiographically normal.     Left Heart    Left Ventricle The left ventricular size is normal. The left ventricular systolic function is normal. The left ventricular ejection fraction is 55-65% by visual estimate. There are no wall motion abnormalities in the left ventricle.   Mitral Valve There is no mitral valve regurgitation.   Aortic Valve There is no aortic valve stenosis.     Radiology/Studies  Dg Chest 2 View  01/05/2015   CLINICAL DATA:  Chest pain.  Pre cardiac catheterization  EXAM: CHEST  2 VIEW  COMPARISON:  September 14, 2012  FINDINGS: There is no edema or consolidation. The heart size and pulmonary vascularity are normal. No adenopathy. There is degenerative change in mid thoracic spine.  IMPRESSION: No edema or consolidation.   Electronically Signed   By: Lowella Grip III M.D.   On: 01/05/2015 10:19    ASSESSMENT AND PLAN Active Problems:   Effort angina   - Cath 01/07/15 showed 99% in-stent restenosis in mLAD extending to the distal edge s/p PTCA/DES; LV EF of 55-65%; Ost 2nd Diag to 2nd Diag lesion, 40% stenosed;  Ost 2nd Mrg to 2nd Mrg lesion, 50% stenosed; Mid Cx lesion, 60% stenosed; Mid RCA to Dist RCA lesion, 20% stenosed. - Continue ASA, plavix, lopressor, ramipril, Lipitor.    Dispo: noted pt has colitis s/p rectal bleeding 10/2014. F/u endoscopy 12/09/14 showed 1. Mild diverticulosis was noted in the sigmoid colon 2. The examination was otherwise normal. Previous colitis probably secondary to ischemia.  Hemoglobin today (01/08/15) 13.9, stable from previous. Will plan to check during  outpatient office visit.   Jarrett Soho PA-C Pager [phone]  Patient seen and examined and history reviewed. Agree with above findings and plan. Patient is doing well post PCI. Right wrist with some bruising but soft. Good pulse. Stable for DC today on ASA and Plavix. Follow up with Dr. Fletcher Anon.  Dicy Smigel [place], [place] 01/08/2015 10:50 AM

## 2015-01-08 NOTE — Progress Notes (Signed)
CARDIAC REHAB PHASE I   PRE:  Rate/Rhythm: 63 SR    BP: sitting 133/65    SaO2:   MODE:  Ambulation: 700 ft   POST:  Rate/Rhythm: 83 SR    BP: sitting 145/71     SaO2:   Tolerated very well, no c/o. Ed completed with wife present. Pt interested in CRPII and will send referral to [place]. Understands importance of Plavix.  [id number] Josephina Shih [place] CES, ACSM 01/08/2015 8:59 AM

## 2015-01-08 NOTE — Discharge Summary (Signed)
Discharge Summary   Patient ID: Brian Todd,  MRN: [id number], DOB/AGE: Nov 22, 1945 69 y.o.  Admit date: 01/07/2015 Discharge date: 01/08/2015  [facility] Provider[facility] Primary Cardiologist: Dr. Fletcher Anon  Discharge Diagnoses Active Problems:   Effort angina   Allergies No Known Allergies  Procedures  Cath 01/07/15 Conclusion     Ost 2nd Diag to 2nd Diag lesion, 40% stenosed.  Ost 2nd Mrg to 2nd Mrg lesion, 50% stenosed.  Mid Cx lesion, 60% stenosed.  Mid RCA to Dist RCA lesion, 20% stenosed.  The left ventricular systolic function is normal.  Mid LAD lesion, 99% stenosed. There is a 0% residual stenosis post intervention. A drug-eluting stent was placed. The lesion was previously treated with a drug-eluting stent greater than two years ago.  A drug-eluting stent was placed.  1. Severe one-vessel coronary artery disease with 99% in-stent restenosis in the mid LAD extending to the distal edge. There is moderate mid left circumflex stenosis at the bifurcation of OM 2/ OM 3. 2. Normal LV systolic function. Normal left ventricular end-diastolic pressure.  Recommendations: Dual platelet therapy for at least one year and preferably longer given that he has a stent inside a stent. aggressive treatment of risk factors. the left circumflex stenosis can likely be treated medically unless he has refractory angina.      Coronary Findings    Dominance: Right   Left Main  The vessel is angiographically normal.     Left Anterior Descending   . Dist LAD lesion, 99% stenosed. The lesion was previously treated with a drug-eluting stent greater than two years ago.   Marland Kitchen PCI: The pre-interventional distal flow is normal (TIMI 3). Pre-stent angioplasty was performed. A drug-eluting stent was placed. The strut is apposed. Post-stent angioplasty was performed. Maximum pressure: 14 atm. The post-interventional distal flow is normal (TIMI 3). The  intervention was successful. No complications occurred at this lesion.  . There is no residual stenosis post intervention.     . First Diagonal Branch   The vessel is small in size and exhibits minimal luminal irregularities.   . Second Diagonal Branch   The vessel is moderate in size. There is mild.   Colon Flattery 2nd Diag to 2nd Diag lesion, 40% stenosed.     Left Circumflex   . Mid Cx lesion, 60% stenosed.   . First Obtuse Marginal Branch   The vessel is small in size. There is mild.   . Second Obtuse Marginal Branch   . Ost 2nd Mrg to 2nd Mrg lesion, 50% stenosed.     Right Coronary Artery   . Mid RCA to Dist RCA lesion, 20% stenosed.   . Right Posterior Descending Artery   The vessel is moderate in size and is angiographically normal.   . Right Posterior Atrioventricular Branch   The vessel is angiographically normal.     Left Heart    Left Ventricle The left ventricular size is normal. The left ventricular systolic function is normal. The left ventricular ejection fraction is 55-65% by visual estimate. There are no wall motion abnormalities in the left ventricle.   Mitral Valve There is no mitral valve regurgitation.   Aortic Valve There is no aortic valve stenosis.         History of Present Illness  This is a pleasant Brian Todd with history of CAD, HTN, HL, GERD, Colitis s/p rectal bleeding (10/2014) who referred to Dr. Fletcher Anon for evaluation of exertional chest pain.   He had  a small non-ST elevation myocardial infarction in 11/2200. Cardiac catheterization at that time showed a 95% stenosis in the mid left anterior descending, 40% narrowing in the second marginal branch of the circumflex artery, irregularities in the right coronary artery, S/p PTCA and Taxus DES placement to proximal LAD. He was part of a research study at that time. No cardiac events since then.He reported having a stress test done before  his cardiac catheterization which was falsely negative.  Noted pt has colitis s/p rectal bleeding 10/2014. F/u endoscopy 12/09/14 showed mild diverticulosis was noted in the sigmoid colon; revious colitis probably secondary to ischemia.    Over the last few weeks before seen by Dr. Fletcher Anon 01/02/2015, he has experienced recurrent substernal chest pain described as burning sensation with no radiation which is mainly exertional with regular everyday activities such as taking the trash out. This has worsened over the last week. The symptoms are very similar to his previous angina. No rest pain. He has been taking his medications regularly.  Hospital Course  He was schedule for outpatient cath 01/07/15. Pre cath lab were normal. Cath showed 99% in-stent restenosis in mLAD extending to the distal edge s/p PTCA/DES; LV EF of 55-65%; Ost 2nd Diag to 2nd Diag lesion, 40% stenosed; Ost 2nd Mrg to 2nd Mrg lesion, 50% stenosed; Mid Cx lesion, 60% stenosed; Mid RCA to Dist RCA lesion, 20% stenosed. He denied chest pain or SOB post cath. No R radial hematoma. Plan to continue Continue ASA, plavix, lopressor, ramipril, Lipitor. Hemoglobin today (01/08/15) 13.9, stable from previous. Will plan to check during outpatient office visit.    Discharge Vitals Blood pressure 133/65, pulse 57, temperature 97.8 F (36.6 C), temperature source Oral, resp. rate 20, height _0  (1.727 m), weight 194 lb 10.7 oz (88.3 kg), SpO2 98 %.  Filed Weights   01/07/15 0722 01/08/15 0037  Weight: 197 lb (89.359 kg) 194 lb 10.7 oz (88.3 kg)    Labs  CBC  Recent Labs  01/08/15 0523  WBC 8.0  HGB 13.9  HCT 41.4  MCV 90.4  PLT 677   Basic Metabolic Panel  Recent Labs  01/08/15 0523  NA 139  K 4.4  CL 104  CO2 27  GLUCOSE 94  BUN 11  CREATININE 0.95  CALCIUM 8.9   Liver Function Tests  Recent Labs  01/08/15 0523  AST 23  ALT 26  ALKPHOS 76  BILITOT 1.2  PROT 6.3*  ALBUMIN 3.5   Disposition  Pt is being  discharged home today in good condition.  Follow-up Plans & Appointments  Follow-up Information    Follow up with Kathlyn Sacramento, MD On 02/02/2015.   Specialty:  Cardiology   Why:  @ 3:45 post cath   Contact information:   [place] [address] [phone]          Discharge Instructions    Diet - low sodium heart healthy    Complete by:  As directed      Increase activity slowly    Complete by:  As directed   NO HEAVY LIFTING (>10lbs) X 2 WEEKS. NO SEXUAL ACTIVITY X 2 WEEKS. NO DRIVING X 1 WEEK. NO SOAKING BATHS, HOT TUBS, POOLS, ETC., X 7 DAYS.          F/u Labs/Studies: Check Hemoglobin in 3-4 weeks, on DAPT. Hx of recent rectal bleeding.   Discharge Medications    Medication List    TAKE these medications  aspirin EC 81 MG tablet  Take 1 tablet (81 mg total) by mouth daily.     atorvastatin 80 MG tablet  Commonly known as:  LIPITOR  Take 1 tablet (80 mg total) by mouth daily.     clopidogrel 75 MG tablet  Commonly known as:  PLAVIX  Take 1 tablet (75 mg total) by mouth daily.     Krill Oil 1000 MG Caps  Take 1 capsule by mouth daily.     metoprolol 50 MG tablet  Commonly known as:  LOPRESSOR  TAKE 1/2 TABLET BY MOUTH 2 TIMES DAILY     MULTIVITAMIN & MINERAL PO  Take 1 tablet by mouth daily.     nitroGLYCERIN 0.4 MG SL tablet  Commonly known as:  NITROSTAT  Place 1 tablet (0.4 mg total) under the tongue every 5 (five) minutes as needed for chest pain.     pantoprazole 40 MG tablet  Commonly known as:  PROTONIX  Take 1 tablet (40 mg total) by mouth daily.     ramipril 10 MG capsule  Commonly known as:  ALTACE  Take 1 capsule (10 mg total) by mouth daily.        Duration of Discharge Encounter   Greater than 30 minutes including physician time.  Signed, Teyla Skidgel PA-C 01/08/2015, 8:07 AM

## 2015-01-11 ENCOUNTER — Observation Stay (HOSPITAL_COMMUNITY)
Admission: EM | Admit: 2015-01-11 | Discharge: 2015-01-12 | Disposition: A | Discharge status: Home / Self Care | Payer: Medicare HMO | Attending: Internal Medicine | Admitting: Internal Medicine

## 2015-01-11 ENCOUNTER — Encounter (HOSPITAL_COMMUNITY): Payer: Self-pay | Admitting: Emergency Medicine

## 2015-01-11 DIAGNOSIS — I25119 Atherosclerotic heart disease of native coronary artery with unspecified angina pectoris: Secondary | ICD-10-CM

## 2015-01-11 DIAGNOSIS — K219 Gastro-esophageal reflux disease without esophagitis: Secondary | ICD-10-CM | POA: Diagnosis not present

## 2015-01-11 DIAGNOSIS — M1990 Unspecified osteoarthritis, unspecified site: Secondary | ICD-10-CM | POA: Diagnosis not present

## 2015-01-11 DIAGNOSIS — I2510 Atherosclerotic heart disease of native coronary artery without angina pectoris: Secondary | ICD-10-CM | POA: Diagnosis not present

## 2015-01-11 DIAGNOSIS — R079 Chest pain, unspecified: Secondary | ICD-10-CM

## 2015-01-11 DIAGNOSIS — R109 Unspecified abdominal pain: Secondary | ICD-10-CM | POA: Diagnosis present

## 2015-01-11 DIAGNOSIS — I10 Essential (primary) hypertension: Secondary | ICD-10-CM | POA: Insufficient documentation

## 2015-01-11 DIAGNOSIS — Z79899 Other long term (current) drug therapy: Secondary | ICD-10-CM | POA: Diagnosis not present

## 2015-01-11 DIAGNOSIS — L259 Unspecified contact dermatitis, unspecified cause: Secondary | ICD-10-CM | POA: Diagnosis not present

## 2015-01-11 DIAGNOSIS — R112 Nausea with vomiting, unspecified: Secondary | ICD-10-CM | POA: Diagnosis not present

## 2015-01-11 DIAGNOSIS — Z9861 Coronary angioplasty status: Secondary | ICD-10-CM | POA: Diagnosis not present

## 2015-01-11 DIAGNOSIS — E785 Hyperlipidemia, unspecified: Secondary | ICD-10-CM | POA: Diagnosis not present

## 2015-01-11 DIAGNOSIS — K589 Irritable bowel syndrome without diarrhea: Secondary | ICD-10-CM | POA: Insufficient documentation

## 2015-01-11 DIAGNOSIS — Z7982 Long term (current) use of aspirin: Secondary | ICD-10-CM | POA: Diagnosis not present

## 2015-01-11 DIAGNOSIS — H9190 Unspecified hearing loss, unspecified ear: Secondary | ICD-10-CM | POA: Insufficient documentation

## 2015-01-11 DIAGNOSIS — R5383 Other fatigue: Secondary | ICD-10-CM | POA: Diagnosis not present

## 2015-01-11 DIAGNOSIS — K829 Disease of gallbladder, unspecified: Secondary | ICD-10-CM | POA: Diagnosis not present

## 2015-01-11 DIAGNOSIS — Z7902 Long term (current) use of antithrombotics/antiplatelets: Secondary | ICD-10-CM | POA: Diagnosis not present

## 2015-01-11 DIAGNOSIS — R197 Diarrhea, unspecified: Secondary | ICD-10-CM | POA: Insufficient documentation

## 2015-01-11 DIAGNOSIS — R0789 Other chest pain: Secondary | ICD-10-CM | POA: Diagnosis not present

## 2015-01-11 LAB — COMPREHENSIVE METABOLIC PANEL
ALBUMIN: 3.9 g/dL (ref 3.5–5.0)
ALT: 41 U/L (ref 17–63)
AST: 36 U/L (ref 15–41)
Alkaline Phosphatase: 95 U/L (ref 38–126)
Anion gap: 7 (ref 5–15)
BUN: 19 mg/dL (ref 6–20)
CO2: 24 mmol/L (ref 22–32)
CREATININE: 0.93 mg/dL (ref 0.61–1.24)
Calcium: 9.3 mg/dL (ref 8.9–10.3)
Chloride: 103 mmol/L (ref 101–111)
GFR calc Af Amer: >60 mL/min (ref >60)
GFR calc non Af Amer: >60 mL/min (ref >60)
GLUCOSE: 106 mg/dL — AB (ref 65–99)
Potassium: 4.1 mmol/L (ref 3.5–5.1)
SODIUM: 134 mmol/L — AB (ref 135–145)
TOTAL PROTEIN: 7.3 g/dL (ref 6.5–8.1)
Total Bilirubin: 1.1 mg/dL (ref 0.3–1.2)

## 2015-01-11 LAB — CBC WITH DIFFERENTIAL/PLATELET
BASOS PCT: 1 % (ref 0–1)
Basophils Absolute: 0 10*3/uL (ref 0.0–0.1)
Eosinophils Absolute: 0.1 10*3/uL (ref 0.0–0.7)
Eosinophils Relative: 1 % (ref 0–5)
HEMATOCRIT: 46.7 % (ref 39.0–52.0)
HEMOGLOBIN: 15.9 g/dL (ref 13.0–17.0)
LYMPHS ABS: 1.5 10*3/uL (ref 0.7–4.0)
Lymphocytes Relative: 23 % (ref 12–46)
MCH: 30.5 pg (ref 26.0–34.0)
MCHC: 34 g/dL (ref 30.0–36.0)
MCV: 89.6 fL (ref 78.0–100.0)
Monocytes Absolute: 0.6 10*3/uL (ref 0.1–1.0)
Monocytes Relative: 9 % (ref 3–12)
Neutro Abs: 4.4 10*3/uL (ref 1.7–7.7)
Neutrophils Relative %: 66 % (ref 43–77)
Platelets: 203 10*3/uL (ref 150–400)
RBC: 5.21 MIL/uL (ref 4.22–5.81)
RDW: 12.4 % (ref 11.5–15.5)
WBC: 6.6 10*3/uL (ref 4.0–10.5)

## 2015-01-11 LAB — TROPONIN I
TROPONIN I: 0.09 ng/mL — AB (ref <0.031)
Troponin I: 0.1 ng/mL — ABNORMAL HIGH (ref <0.031)
Troponin I: 0.07 ng/mL — ABNORMAL HIGH (ref <0.031)

## 2015-01-11 MED ORDER — ATORVASTATIN CALCIUM 80 MG PO TABS
80.0000 mg | ORAL_TABLET | Freq: Every day | ORAL | Status: DC
  Administered 2015-01-11: 80 mg via ORAL
  Filled 2015-01-11: qty 1

## 2015-01-11 MED ORDER — NITROGLYCERIN 0.4 MG SL SUBL: 0.4000 mg | SUBLINGUAL_TABLET | SUBLINGUAL | Status: DC | PRN

## 2015-01-11 MED ORDER — HEPARIN SODIUM (PORCINE) 5000 UNIT/ML IJ SOLN: 5000.0000 [IU] | Freq: Three times a day (TID) | INTRAMUSCULAR | Status: DC

## 2015-01-11 MED ORDER — SODIUM CHLORIDE 0.9 % IV BOLUS (SEPSIS)
500.0000 mL | Freq: Once | INTRAVENOUS | Status: AC
  Administered 2015-01-11: 500 mL via INTRAVENOUS

## 2015-01-11 MED ORDER — RAMIPRIL 5 MG PO CAPS
10.0000 mg | ORAL_CAPSULE | Freq: Every day | ORAL | Status: DC
Start: 2015-01-12 — End: 2015-01-12

## 2015-01-11 MED ORDER — ASPIRIN EC 81 MG PO TBEC: 81.0000 mg | DELAYED_RELEASE_TABLET | Freq: Every day | ORAL | Status: DC

## 2015-01-11 MED ORDER — METOPROLOL TARTRATE 25 MG PO TABS
25.0000 mg | ORAL_TABLET | Freq: Two times a day (BID) | ORAL | Status: DC
  Administered 2015-01-11: 25 mg via ORAL
  Filled 2015-01-11: qty 1

## 2015-01-11 MED ORDER — ONDANSETRON HCL 4 MG/2ML IJ SOLN: 4.0000 mg | Freq: Four times a day (QID) | INTRAMUSCULAR | Status: DC | PRN

## 2015-01-11 MED ORDER — CLOPIDOGREL BISULFATE 75 MG PO TABS: 75.0000 mg | ORAL_TABLET | Freq: Every day | ORAL | Status: DC

## 2015-01-11 MED ORDER — PANTOPRAZOLE SODIUM 40 MG PO TBEC: 40.0000 mg | DELAYED_RELEASE_TABLET | Freq: Every day | ORAL | Status: DC

## 2015-01-11 MED ORDER — OMEGA-3-ACID ETHYL ESTERS 1 G PO CAPS: 1.0000 g | ORAL_CAPSULE | Freq: Every day | ORAL | Status: DC

## 2015-01-11 MED ORDER — ACETAMINOPHEN 325 MG PO TABS: 650.0000 mg | ORAL_TABLET | ORAL | Status: DC | PRN

## 2015-01-11 MED ORDER — KRILL OIL 1000 MG PO CAPS: 1.0000 | ORAL_CAPSULE | Freq: Every day | ORAL | Status: DC

## 2015-01-11 NOTE — ED Provider Notes (Addendum)
CSN: [id number]     Arrival date & time 01/11/15  4401 History   First MD Initiated Contact with Patient 01/11/15 [phone]     Chief Complaint  Patient presents with  . Abdominal Pain  . Nausea  . Diarrhea     (Consider location/radiation/quality/duration/timing/severity/associated sxs/prior Treatment) HPI Comments: 69 year old male with lipids, high blood pressure, coronary artery disease, reflux, joint disease, dermatitis, colitis history presents with intermittent left sharp chest pain and abdominal discomfort with nausea and diarrhea. Patient had a stent placed on Wednesday at [facility] by Dr. Earlie Server A and has been doing okay until yesterday when he felt overall tired and no energy. Last night he had intermittent left lower chest discomfort at times brief other times lasting an hour of sharp pain. Today patient has had intermittent cramping and nonbloody diarrhea few episodes. No C. difficile history, patient has a colitis 2 weeks prior when he is placed on antibodies. Currently no pain in chest or abdomen. Patient has close follow-up outpatient. No fevers or chills.  Patient is a 69 y.o. male presenting with abdominal pain and diarrhea. The history is provided by the patient.  Abdominal Pain Associated symptoms: chest pain, diarrhea, fatigue, nausea and vomiting   Associated symptoms: no chills, no dysuria, no fever and no shortness of breath   Diarrhea Associated symptoms: abdominal pain and vomiting   Associated symptoms: no chills, no fever and no headaches     Past Medical History  Diagnosis Date  . Hearing loss   . Hypertension   . Coronary artery disease   . Hyperlipidemia   . GERD (gastroesophageal reflux disease)   . IBS (irritable bowel syndrome)   . Gallbladder disease   . Increased prostate specific antigen (PSA) velocity   . DJD (degenerative joint disease)   . Colitis    Past Surgical History  Procedure Laterality Date  . Tonsillectomy and adenoidectomy      in  the 2nd grade  . Coronary stent placement      Dr. [name]  . Colonoscopy    . Cardiac catheterization    . Coronary stent placement  01/07/2015    LAD  . Cardiac catheterization N/A 01/07/2015    Procedure: Left Heart Cath and Coronary Angiography;  Surgeon: [name], MD;  Location: [place] [facility];  Service: Cardiovascular;  Laterality: N/A;  . Cardiac catheterization N/A 01/07/2015    Procedure: Coronary Stent Intervention;  Surgeon: [name], MD;  Location: [place] [facility];  Service: Cardiovascular;  Laterality: N/A;   Family History  Problem Relation Age of Onset  . Stroke Mother    History  Substance Use Topics  . Smoking status: Never Smoker   . Smokeless tobacco: Never Used  . Alcohol Use: No     Comment: rare use    Review of Systems  Constitutional: Positive for fatigue. Negative for fever and chills.  HENT: Negative for congestion.   Eyes: Negative for visual disturbance.  Respiratory: Negative for shortness of breath.   Cardiovascular: Positive for chest pain. Negative for leg swelling.  Gastrointestinal: Positive for nausea, vomiting, abdominal pain and diarrhea.  Genitourinary: Negative for dysuria and flank pain.  Musculoskeletal: Negative for back pain, neck pain and neck stiffness.  Skin: Negative for rash.  Neurological: Negative for light-headedness and headaches.      Allergies  Review of patient's allergies indicates no known allergies.  Home Medications   Prior to Admission medications   Medication Sig Start Date End Date  Taking? Authorizing Provider  aspirin EC 81 MG tablet Take 1 tablet (81 mg total) by mouth daily. 01/01/15  Yes Lucille Passy, MD  clopidogrel (PLAVIX) 75 MG tablet Take 1 tablet (75 mg total) by mouth daily. 01/02/15 01/02/16 Yes [name], MD  KRILL OIL 1000 MG CAPS Take 1 capsule by mouth daily.   Yes Historical Provider, MD  metoprolol (LOPRESSOR) 50 MG tablet TAKE 1/2 TABLET BY MOUTH 2 TIMES DAILY  10/31/14  Yes Abner Greenspan, MD  Multiple Vitamins-Minerals (MULTIVITAMIN & MINERAL PO) Take 1 tablet by mouth daily.   Yes Historical Provider, MD  nitroGLYCERIN (NITROSTAT) 0.4 MG SL tablet Place 1 tablet (0.4 mg total) under the tongue every 5 (five) minutes as needed for chest pain. 01/08/15  Yes Bhavinkumar Bhagat, PA  ramipril (ALTACE) 10 MG capsule Take 1 capsule (10 mg total) by mouth daily. 10/31/14  Yes Abner Greenspan, MD  atorvastatin (LIPITOR) 80 MG tablet Take 0.5 tablets (40 mg total) by mouth daily. 01/12/15   Rhonda G Barrett, PA-C  pantoprazole (PROTONIX) 40 MG tablet Take 1 tablet (40 mg total) by mouth 2 (two) times daily before a meal. 01/12/15   Rhonda G Barrett, PA-C   BP 125/65 mmHg  Pulse 52  Temp(Src) 97.8 F (36.6 C) (Oral)  Resp 18  Ht 5\' 8"  (1.727 m)  Wt 186 lb 12.8 oz (84.732 kg)  BMI 28.41 kg/m2  SpO2 96% Physical Exam  Constitutional: He is oriented to person, place, and time. He appears well-developed and well-nourished.  HENT:  Head: Normocephalic and atraumatic.  Mild dryness membranes  Eyes: Conjunctivae are normal. Right eye exhibits no discharge. Left eye exhibits no discharge.  Neck: Normal range of motion. Neck supple. No tracheal deviation present.  Cardiovascular: Normal rate and regular rhythm.   Pulmonary/Chest: Effort normal and breath sounds normal.  Abdominal: Soft. He exhibits no distension. There is no tenderness. There is no guarding.  Musculoskeletal: He exhibits no edema.  Neurological: He is alert and oriented to person, place, and time.  Skin: Skin is warm. No rash noted.  Mild contact dermatitis right forearm, 2+ pulses in the right arm were previous cath was done  Psychiatric: He has a normal mood and affect.  Nursing note and vitals reviewed.   ED Course  Procedures (including critical care time) Labs Review Labs Reviewed  COMPREHENSIVE METABOLIC PANEL - Abnormal; Notable for the following:    Sodium 134 (*)    Glucose, Bld 106  (*)    All other components within normal limits  TROPONIN I - Abnormal; Notable for the following:    Troponin I 0.10 (*)    All other components within normal limits  TROPONIN I - Abnormal; Notable for the following:    Troponin I 0.09 (*)    All other components within normal limits  TROPONIN I - Abnormal; Notable for the following:    Troponin I 0.07 (*)    All other components within normal limits  TROPONIN I - Abnormal; Notable for the following:    Troponin I 0.08 (*)    All other components within normal limits  TROPONIN I - Abnormal; Notable for the following:    Troponin I 0.05 (*)    All other components within normal limits  BASIC METABOLIC PANEL - Abnormal; Notable for the following:    Glucose, Bld 100 (*)    All other components within normal limits  CLOSTRIDIUM DIFFICILE BY PCR (NOT AT [facility])  CBC  WITH DIFFERENTIAL/PLATELET    Imaging Review No results found.   EKG Interpretation   Date/Time:  Sunday January 11 2015 09:47:54 EDT Ventricular Rate:  56 PR Interval:  187 QRS Duration: 92 QT Interval:  396 QTC Calculation: 382 R Axis:   60 Text Interpretation:  Sinus rhythm ED PHYSICIAN INTERPRETATION AVAILABLE  IN [place] Confirmed by TEST, Record ([id number]) on 01/12/2015 6:33:22  AM     Repeat EKG reviewed by myself Heart rate 56, no acute ST elevation or ST depression, normal QT, sinus. MDM   Final diagnoses:  Chest pain, unspecified chest pain type  Abdominal cramping  Diarrhea   Patient presents with 2 separate concerns. Intermittent brief atypical chest pain atypical This is different than his previous symptoms he had prior to requiring a stent. No chest pain currently. EKG initially showed nonspecific findings poor baseline. Repeat EKG no acute findings.  Patient has had intermittent nausea and diarrhea, no focal abdominal pain. Discussed likely infectious etiology. If patient can give a sample we will check for C. Difficile.    Patient  well-appearing in improved with IV fluids and recheck. Patient's initial troponin mild elevated, discussion with both patient and cardiology Dr. Caryl Comes regarding post cath mild elevation versus heart attack/stent issue. Plan for repeat troponin, repeat troponin similar/mild decreased. Patient has had no chest pain or shortness of breath in ER. Repeat cardiology discussed in detail Dr. Caryl Comes. Plan for close outpatient follow-up, strict reasons to return given to patient and family. Patient has appointment.  During discharge instructions patient inform me he had 2 brief episodes of chest discomfort lasting a few seconds. We long discussion regarding follow-up outpatient and patient is concerned as if this continues as he likely will he is not sure when to come back to the hospital. Plan for repeat in cardiology for observation placement.Cardiology consulted in the ED and saw the patient for final disposition.  Medications  sodium chloride 0.9 % bolus 500 mL (0 mLs Intravenous Stopped 01/11/15 1115)    Filed Vitals:   01/11/15 1800 01/11/15 1833 01/11/15 2143 01/12/15 0508  BP: 132/68 146/81 128/76 125/65  Pulse: 77 72 64 52  Temp:  98.3 F (36.8 C) 98.2 F (36.8 C) 97.8 F (36.6 C)  TempSrc:   Oral Oral  Resp: 13 18 18 18   Height:  5\' 8"  (1.727 m)    Weight:  186 lb 15.2 oz (84.8 kg)  186 lb 12.8 oz (84.732 kg)  SpO2: 98% 100% 98% 96%    Final diagnoses:  Chest pain, unspecified chest pain type  Abdominal cramping  Diarrhea      Elnora Morrison, MD 01/11/15 1350  Elnora Morrison, MD 01/13/15 2220

## 2015-01-11 NOTE — Progress Notes (Addendum)
ED CM reviewed record and met with patient and wife concerning observation status, reviewed obs guidelines with  patient and wife who verbalized understanding., Provided patient with copy of Observation Notification, original placed in shadow chart.

## 2015-01-11 NOTE — ED Notes (Signed)
Pt reports had a stent placed Wednesday, today he experienced abdominal pain with N/V/D.

## 2015-01-11 NOTE — ED Notes (Signed)
Case management at bedside.

## 2015-01-11 NOTE — H&P (Signed)
ELECTROPHYSIOLOGY CONSULT NOTE  Patient ID: Brian Todd, MRN: [id number], DOB/AGE: [birth year] 69 y.o. Admit date: 01/11/2015 Date of Consult: 01/11/2015  Primary Physician: [name], MD Primary Cardiologist MA  Chief Complaint: Chest pain   HPI Brian Todd is a 69 y.o. male  referred for evaluation of exertional chest pain.     He has known history of coronary artery disease. He had a small non-ST elevation myocardial infarction in 2001. Cardiac catheterization showed a 95% proximal LAD stenosis and 40% stenosis in OM branch. He had successful angioplasty and Taxus drug-eluting stent placement to the proximal LAD without complications.  He has other chronic medical conditions that include hypertension, hyperlipidemia and gastroesophageal reflux disease. He was seen last week by Dr. Gaylyn Cheers with complaints of recurring chest discomfort similar to his prior angina. It is largely exertional, relieved by rest. Characterized as burning but without radiation.   Troponins in the ER were initially 0.10 and 2 hours later was 0.09  On 6/15 he underwent catheterization  >>   Ost 2nd Diag to 2nd Diag lesion, 40% stenosed.  Ost 2nd Mrg to 2nd Mrg lesion, 50% stenosed.  Mid Cx lesion, 60% stenosed.  Mid RCA to Dist RCA lesion, 20% stenosed.  The left ventricular systolic function is normal.  Mid LAD lesion, 99% in-stent restenosis. There is a 0% residual stenosis post intervention. A drug-eluting stent was placed. The lesion was previously treated with a drug-eluting stent greater than two years ago.  A drug-eluting stent was placed.  1. Severe one-vessel coronary artery disease with 99% in-stent restenosis in the mid LAD extending to the distal edge. There is moderate mid left circumflex stenosis at the bifurcation of OM 2/ OM 3. 2. Normal LV systolic function. Normal left ventricular end-diastolic pressure.    He felt pretty good the first day following discharge after his  stent procedure. However, the last 48 hours have been noteworthy for general malaise, rapid fleeting chest pains very distinct from his prior ischemia manifestation ie  burning, Some lower abdominal discomfort associated with nausea but without vomiting or diarrhea and then more recently the development of a chest heaviness again distinct from his anginal syndrome. His only new medication is Plavix. No one else at home is ill. These are new symptoms and unfamiliar.  His Nexium has been changed to pantoprazole  ECG last week demonstrated sinus rhythm at 55 without ST T changes  First ECG this morning demonstrated a heart rate of 85 with mild ST segment depression Repeat ECG demonstrated sinus rhythm at 56 with normal ST segments    Past Medical History  Diagnosis Date  . Hearing loss   . Hypertension   . Coronary artery disease   . Hyperlipidemia   . GERD (gastroesophageal reflux disease)   . IBS (irritable bowel syndrome)   . Gallbladder disease   . Increased prostate specific antigen (PSA) velocity   . DJD (degenerative joint disease)   . Colitis       Surgical History:  Past Surgical History  Procedure Laterality Date  . Tonsillectomy and adenoidectomy      in the 2nd grade  . Coronary stent placement      Dr. [name]  . Colonoscopy    . Cardiac catheterization    . Coronary stent placement  01/07/2015    LAD  . Cardiac catheterization N/A 01/07/2015    Procedure: Left Heart Cath and Coronary Angiography;  Surgeon: [name], MD;  Location: [facility];  Service: Cardiovascular;  Laterality: N/A;  . Cardiac catheterization N/A 01/07/2015    Procedure: Coronary Stent Intervention;  Surgeon: [name], MD;  Location: [facility];  Service: Cardiovascular;  Laterality: N/A;     Home Meds: Prior to Admission medications   Medication Sig Start Date End Date Taking? Authorizing Provider  aspirin EC 81 MG tablet Take 1 tablet (81 mg total) by mouth  daily. 01/01/15  Yes Lucille Passy, MD  atorvastatin (LIPITOR) 80 MG tablet Take 1 tablet (80 mg total) by mouth daily. 01/08/15  Yes Bhavinkumar Bhagat, PA  clopidogrel (PLAVIX) 75 MG tablet Take 1 tablet (75 mg total) by mouth daily. 01/02/15 01/02/16 Yes [name], MD  KRILL OIL 1000 MG CAPS Take 1 capsule by mouth daily.   Yes Historical Provider, MD  metoprolol (LOPRESSOR) 50 MG tablet TAKE 1/2 TABLET BY MOUTH 2 TIMES DAILY 10/31/14  Yes Abner Greenspan, MD  Multiple Vitamins-Minerals (MULTIVITAMIN & MINERAL PO) Take 1 tablet by mouth daily.   Yes Historical Provider, MD  nitroGLYCERIN (NITROSTAT) 0.4 MG SL tablet Place 1 tablet (0.4 mg total) under the tongue every 5 (five) minutes as needed for chest pain. 01/08/15  Yes Bhavinkumar Bhagat, PA  pantoprazole (PROTONIX) 40 MG tablet Take 1 tablet (40 mg total) by mouth daily. 01/02/15  Yes [name], MD  ramipril (ALTACE) 10 MG capsule Take 1 capsule (10 mg total) by mouth daily. 10/31/14  Yes Abner Greenspan, MD      Allergies: No Known Allergies  History   Social History  . Marital Status: Married    Spouse Name: linda wrenn Belkin  . Number of Children: 2  . Years of Education: N/A   Occupational History  . engineering     Retired   Social History Main Topics  . Smoking status: Never Smoker   . Smokeless tobacco: Never Used  . Alcohol Use: No     Comment: rare use  . Drug Use: No  . Sexual Activity: Yes   Other Topics Concern  . Not on file   Social History Narrative     Family History  Problem Relation Age of Onset  . Stroke Mother      ROS:  Please see the history of present illness.     All other systems reviewed and negative.    Physical Exam: Blood pressure 130/71, pulse 62, temperature 97.6 F (36.4 C), temperature source Oral, resp. rate 16, height 5\' 8"  (1.727 m), weight 184 lb (83.462 kg), SpO2 100 %. General: Well developed, well nourished male in no acute distress. Head: Normocephalic, atraumatic,  sclera non-icteric, no xanthomas, nares are without discharge. EENT: normal Lymph Nodes:  none Back: without scoliosis/kyphosis, no CVA tendersness Neck: Negative for carotid bruits. JVD not elevated. Lungs: Clear bilaterally to auscultation without wheezes, rales, or rhonchi. Breathing is unlabored. Heart: RRR with S1 S2. 2/6 systolic murmur , rubs, or gallops appreciated. Abdomen: Soft, non-tender, non-distended with normoactive bowel sounds. No hepatomegaly. No rebound/guarding. No obvious abdominal masses. Msk:  Strength and tone appear normal for age. Extremities: No clubbing or cyanosis. No  edema.  Distal pedal pulses are 2+ and equal bilaterally. Skin: Warm and Dry Neuro: Alert and oriented X 3. CN III-XII intact Grossly normal sensory and motor function . Psych:  Responds to questions appropriately with a normal affect.      Labs: Cardiac Enzymes  Recent Labs  01/11/15 0937 01/11/15 1140  TROPONINI 0.10*  0.09*   CBC Lab Results  Component Value Date   WBC 6.6 01/11/2015   HGB 15.9 01/11/2015   HCT 46.7 01/11/2015   MCV 89.6 01/11/2015   PLT 203 01/11/2015   PROTIME: No results for input(s): LABPROT, INR in the last 72 hours. Chemistry  Recent Labs Lab 01/11/15 0859  NA 134*  K 4.1  CL 103  CO2 24  BUN 19  CREATININE 0.93  CALCIUM 9.3  PROT 7.3  BILITOT 1.1  ALKPHOS 95  ALT 41  AST 36  GLUCOSE 106*   Lipids Lab Results  Component Value Date   CHOL 147 10/24/2014   HDL 36.80* 10/24/2014   LDLCALC 76 10/24/2014   TRIG 171.0* 10/24/2014   BNP No results found for: PROBNP Thyroid Function Tests: No results for input(s): TSH, T4TOTAL, T3FREE, THYROIDAB in the last 72 hours.  Invalid input(s): FREET3    Miscellaneous No results found for: DDIMER  Radiology/Studies:  Dg Chest 2 View  01/05/2015   CLINICAL DATA:  Chest pain.  Pre cardiac catheterization  EXAM: CHEST  2 VIEW  COMPARISON:  September 14, 2012  FINDINGS: There is no edema or  consolidation. The heart size and pulmonary vascularity are normal. No adenopathy. There is degenerative change in mid thoracic spine.  IMPRESSION: No edema or consolidation.   Electronically Signed   By: Lowella Grip III M.D.   On: 01/05/2015 10:19    EKG: As above   Assessment and Plan:  CAD s/p recent stent  Atypical chest pain and some abdominal discomfort  Remote history of Plavix exposure  Hypertension  I dont know why he is more symptomjatic following the procedure   The most common culprit is drugs and have reviewed with JV and he thinks these as uncommon but list of SE suggests that this is possible  The other new drug is the uptitiratred atorvastatin and there are data that some of the AE with statins cvan be dose related  For now will admit for observation Cycle enzymes and if symptoms dont abate Transition in am from clopidogrel >> brilinta  For now would not add antihypertensive therapy as dont want to confuse the picture potentially relaed to medication sideeffects   Virl Axe

## 2015-01-12 DIAGNOSIS — R5383 Other fatigue: Secondary | ICD-10-CM | POA: Diagnosis not present

## 2015-01-12 DIAGNOSIS — R079 Chest pain, unspecified: Secondary | ICD-10-CM | POA: Diagnosis not present

## 2015-01-12 DIAGNOSIS — R109 Unspecified abdominal pain: Secondary | ICD-10-CM | POA: Diagnosis not present

## 2015-01-12 DIAGNOSIS — R072 Precordial pain: Secondary | ICD-10-CM

## 2015-01-12 DIAGNOSIS — R197 Diarrhea, unspecified: Secondary | ICD-10-CM | POA: Diagnosis not present

## 2015-01-12 LAB — BASIC METABOLIC PANEL
ANION GAP: 9 (ref 5–15)
BUN: 17 mg/dL (ref 6–20)
CO2: 23 mmol/L (ref 22–32)
Calcium: 9.3 mg/dL (ref 8.9–10.3)
Chloride: 106 mmol/L (ref 101–111)
Creatinine, Ser: 0.74 mg/dL (ref 0.61–1.24)
Glucose, Bld: 100 mg/dL — ABNORMAL HIGH (ref 65–99)
POTASSIUM: 4.2 mmol/L (ref 3.5–5.1)
SODIUM: 138 mmol/L (ref 135–145)

## 2015-01-12 LAB — TROPONIN I
TROPONIN I: 0.05 ng/mL — AB (ref <0.031)
TROPONIN I: 0.08 ng/mL — AB (ref <0.031)

## 2015-01-12 LAB — CLOSTRIDIUM DIFFICILE BY PCR: Toxigenic C. Difficile by PCR: NEGATIVE

## 2015-01-12 MED ORDER — PANTOPRAZOLE SODIUM 40 MG PO TBEC: 40.0000 mg | DELAYED_RELEASE_TABLET | Freq: Two times a day (BID) | ORAL | Status: DC

## 2015-01-12 MED ORDER — ATORVASTATIN CALCIUM 80 MG PO TABS: 40.0000 mg | ORAL_TABLET | Freq: Every day | ORAL | Status: DC

## 2015-01-12 NOTE — Discharge Summary (Signed)
CARDIOLOGY DISCHARGE SUMMARY   Patient ID: Brian Todd MRN: [id number] DOB/AGE: 04/18/1946 69 y.o.  Admit date: 01/11/2015 Discharge date: 01/12/2015  PCP: [name], MD Primary Cardiologist: Dr. Fletcher Anon  Primary Discharge Diagnosis:  Chest pain Secondary Discharge Diagnosis:  Hyperlipidemia   Procedures: ECG  Hospital Course: Brian Todd is a 69 y.o. male with a history of stent LAD 2001, cath 01/07/2015 w/ DES LAD for ISR, HTN, HL, and GERD. He was discharged 06/16 after his LAD stent.  Since discharge, he had recurring chest discomfort, very different from his prior ischemia symptoms as well as lower abdominal discomfort and nausea. During his last admission, he had Plavix added to his medication regimen and his Lipitor was increased from 20->80 mg daily. His Nexium had been discontinued and he was put on Protonix.   His symptoms were concerning to him and he came to the emergency room, where his troponins had a mild elevation and he had some mild ST segment depression. He was admitted for further evaluation and treatment.  His symptoms were generally atypical for angina. There was concern that they were coming from side effects of his medications. There was concern that the Lipitor was causing his symptoms but LFTs were checked and they were within normal limits.   He had a mild elevation in his troponin, 0.10. This trended down after admission. Upon review of his previous cath report, he had a 60% circumflex which was to be treated medically. It is not believed that the circumflex is causing his symptoms and his ECG is not consistent with stent closure in the LAD. Therefore, medical therapy is to be continued for CAD.  On 01/12/2015, he was seen by Dr. Percival Spanish and all data were reviewed. The minimal elevation in his troponins was trending down. It is felt these are most likely from his previously placed stent and are not clinically significant at this time.   Mr.  Brumett noted that he had tolerated Plavix well in the past. He also noted that when he had taken his Lipitor the previous evening, he developed abdominal pain shortly afterwards. Therefore, the plan will be to increase the Protonix to twice a day, decrease Lipitor to 40 mg daily (dose he had previously tolerated) and discharge him home, with early outpatient follow-up in [place].  Labs:   Lab Results  Component Value Date   WBC 6.6 01/11/2015   HGB 15.9 01/11/2015   HCT 46.7 01/11/2015   MCV 89.6 01/11/2015   PLT 203 01/11/2015     Recent Labs Lab 01/11/15 0859 01/12/15 0755  NA 134* 138  K 4.1 4.2  CL 103 106  CO2 24 23  BUN 19 17  CREATININE 0.93 0.74  CALCIUM 9.3 9.3  PROT 7.3  --   BILITOT 1.1  --   ALKPHOS 95  --   ALT 41  --   AST 36  --   GLUCOSE 106* 100*    Recent Labs  01/11/15 1853 01/12/15 0023 01/12/15 0755  TROPONINI 0.07* 0.08* 0.05*   Lipid Panel     Component Value Date/Time   CHOL 147 10/24/2014 0817   TRIG 171.0* 10/24/2014 0817   HDL 36.80* 10/24/2014 0817   CHOLHDL 4 10/24/2014 0817   VLDL 34.2 10/24/2014 0817   LDLCALC 76 10/24/2014 0817      Radiology: Dg Chest 2 View 01/05/2015   CLINICAL DATA:  Chest pain.  Pre cardiac catheterization  EXAM: CHEST  2 VIEW  COMPARISON:  September 14, 2012  FINDINGS: There is no edema or consolidation. The heart size and pulmonary vascularity are normal. No adenopathy. There is degenerative change in mid thoracic spine.  IMPRESSION: No edema or consolidation.   Electronically Signed   By: Lowella Grip III M.D.   On: 01/05/2015 10:19   EKG: Sinus rhythm, no acute ischemic changes  FOLLOW UP PLANS AND APPOINTMENTS No Known Allergies   Medication List    TAKE these medications        aspirin EC 81 MG tablet  Take 1 tablet (81 mg total) by mouth daily.     atorvastatin 80 MG tablet  Commonly known as:  LIPITOR  Take 0.5 tablets (40 mg total) by mouth daily.     clopidogrel 75 MG tablet    Commonly known as:  PLAVIX  Take 1 tablet (75 mg total) by mouth daily.     Krill Oil 1000 MG Caps  Take 1 capsule by mouth daily.     metoprolol 50 MG tablet  Commonly known as:  LOPRESSOR  TAKE 1/2 TABLET BY MOUTH 2 TIMES DAILY     MULTIVITAMIN & MINERAL PO  Take 1 tablet by mouth daily.     nitroGLYCERIN 0.4 MG SL tablet  Commonly known as:  NITROSTAT  Place 1 tablet (0.4 mg total) under the tongue every 5 (five) minutes as needed for chest pain.     pantoprazole 40 MG tablet  Commonly known as:  PROTONIX  Take 1 tablet (40 mg total) by mouth 2 (two) times daily before a meal.     ramipril 10 MG capsule  Commonly known as:  ALTACE  Take 1 capsule (10 mg total) by mouth daily.        Discharge Instructions    Diet - low sodium heart healthy    Complete by:  As directed      Increase activity slowly    Complete by:  As directed           Follow-up Information    Call [name], MD.   Specialties:  Family Medicine, Radiology   Why:  As needed   Contact information:   [address] [place] [address] [phone]       Follow up with Kathlyn Sacramento, MD.   Specialty:  Cardiology   Why:  The office will call if he has a cancellation, otherwise keep appointment July 11 at 3:45 PM.   Contact information:   [address] [place] [address] [phone]       BRING ALL MEDICATIONS WITH YOU TO FOLLOW UP APPOINTMENTS  Time spent with patient to include physician time: 45 min Signed: Rosaria Ferries, PA-C 01/12/2015, 11:28 AM Co-Sign MD

## 2015-01-12 NOTE — Progress Notes (Signed)
[name] explained to me that he was told he would be charged more than double for receiving medications from the hospital as this was explained to him by Case Management, but he agreed to take dose of Lipitor and Lopressor.

## 2015-01-12 NOTE — Progress Notes (Signed)
Patient Name: Brian Todd Date of Encounter: 01/12/2015  Active Problems:   Chest pain   Primary Cardiologist: Dr Brian Todd  Patient Profile: 69 yo male w/ hx stent LAD 2001, cath 06/15 w/ DES LAD for ISR, HTN, HL, GERD, who was admitted 06/19 w/ chest pain, ?SE of meds, i.e. Plavix or Lipitor   SUBJECTIVE: Had some abd pain last pm, resolved, chest pain resolved. Had some diarrhea overnight, a little unusual for him. Tolerated Lipitor 40 mg in the past.  OBJECTIVE Filed Vitals:   01/11/15 1800 01/11/15 1833 01/11/15 2143 01/12/15 0508  BP: 132/68 146/81 128/76 125/65  Pulse: 77 72 64 52  Temp:  98.3 F (36.8 C) 98.2 F (36.8 C) 97.8 F (36.6 C)  TempSrc:   Oral Oral  Resp: 13 18 18 18   Height:  5\' 8"  (1.727 m)    Weight:  186 lb 15.2 oz (84.8 kg)  186 lb 12.8 oz (84.732 kg)  SpO2: 98% 100% 98% 96%   No intake or output data in the 24 hours ending 01/12/15 0758 Filed Weights   01/11/15 0841 01/11/15 1833 01/12/15 0508  Weight: 184 lb (83.462 kg) 186 lb 15.2 oz (84.8 kg) 186 lb 12.8 oz (84.732 kg)    PHYSICAL EXAM General: Well developed, well nourished, male in no acute distress. Head: Normocephalic, atraumatic.  Neck: Supple without bruits, JVD not elevated. Lungs:  Resp regular and unlabored, CTA. Heart: RRR, S1, S2, no S3, S4, or murmur; no rub. Abdomen: Soft, non-tender, non-distended, BS + x 4.  Extremities: No clubbing, cyanosis, edema.  Neuro: Alert and oriented X 3. Moves all extremities spontaneously. Psych: Normal affect.  LABS: CBC: Recent Labs  01/11/15 0859  WBC 6.6  NEUTROABS 4.4  HGB 15.9  HCT 46.7  MCV 89.6  PLT 782   Basic Metabolic Panel: Recent Labs  01/11/15 0859  NA 134*  K 4.1  CL 103  CO2 24  GLUCOSE 106*  BUN 19  CREATININE 0.93  CALCIUM 9.3   Liver Function Tests: Recent Labs  01/11/15 0859  AST 36  ALT 41  ALKPHOS 95  BILITOT 1.1  PROT 7.3  ALBUMIN 3.9   Cardiac Enzymes: Recent Labs  01/11/15 1140  01/11/15 1853 01/12/15 0023  TROPONINI 0.09* 0.07* 0.08*   TELE:  SR  Current Medications:  . aspirin EC  81 mg Oral Daily  . atorvastatin  80 mg Oral q1800  . clopidogrel  75 mg Oral Daily  . heparin  5,000 Units Subcutaneous 3 times per day  . metoprolol  25 mg Oral BID  . omega-3 acid ethyl esters  1 g Oral Daily  . pantoprazole  40 mg Oral Q1200  . ramipril  10 mg Oral Daily      ASSESSMENT AND PLAN: Active Problems:   Chest pain  - ez w/ minimal elevation, flat trend, unclear significance - do not think it indicates a NSTEMI - do not think stent has closed.  - CFX 60%, med rx recommended - Pt had tolerated Plavix in the past, he is OK continuing this - Had tolerated Lipitor 40 mg in the past, reduce dose to this    [name] - Nexium changed to Protonix last admit - will increase Protonix to BID for now - If we end up changing to Brilinta, go back on Nexium    Diarrhea - C diff ordered, not performed yet - If pt d/c, f/u with primary MD if diarrhea does not resolve. -  had colitis a few months ago, symptoms are unlike that - no recent ABX  Plan - pt had breakfast, ambulate and see how tolerated. If does well, possible d/c with/without OP stress testing. F/u in [name].   Signed, Brian Todd 7:58 AM 01/12/2015   History and all data above reviewed.  Patient examined.  I agree with the findings as above.  The patient has not has symptoms similar to previous angina.  Pain that he had was very fleeting and axillary.  No further nausea.  The patient exam reveals COR:RRR  ,  Lungs: Clear  ,  Abd: Positive bowel sounds, no rebound no guarding, Ext No edema  .  All available labs, radiology testing, previous records reviewed. Agree with documented assessment and plan. No signs of continued angina.  No further work up.  OK to discharge if EKG OK and he ambulates.  I will reduce the Lipitor dose as this could contribute to his symptoms as the his previous dose was  increased by 4x.  Of note his troponin is nondiagnostic having been elevated previously.    Brian Todd  8:44 AM  01/12/2015

## 2015-01-12 NOTE — Discharge Instructions (Signed)
If you were given medicines take as directed.  If you are on coumadin or contraceptives realize their levels and effectiveness is altered by many different medicines.  If you have any reaction (rash, tongues swelling, other) to the medicines stop taking and see a physician.    If your blood pressure was elevated in the ER make sure you follow up for management with a primary doctor or return for chest pain, shortness of breath or stroke symptoms.  Please follow up as directed and return to the ER or see a physician for new or worsening symptoms.  Thank you. Filed Vitals:   01/11/15 1100 01/11/15 1200 01/11/15 1258 01/11/15 1300  BP: 133/71 95/76 139/68 130/71  Pulse: 56 57 76 62  Temp:      TempSrc:      Resp: 15 14 26 16   Height:      Weight:      SpO2: 99% 100% 98% 100%

## 2015-01-12 NOTE — Progress Notes (Signed)
Pt discharged home with wife. VSS, pt educated

## 2015-01-15 ENCOUNTER — Encounter: Payer: Self-pay | Admitting: Primary Care

## 2015-01-15 ENCOUNTER — Ambulatory Visit (INDEPENDENT_AMBULATORY_CARE_PROVIDER_SITE_OTHER): Payer: Medicare HMO | Admitting: Primary Care

## 2015-01-15 VITALS — BP 120/68 | HR 58 | Temp 97.7°F | Ht 68.0 in | Wt 189.4 lb

## 2015-01-15 DIAGNOSIS — Z09 Encounter for follow-up examination after completed treatment for conditions other than malignant neoplasm: Secondary | ICD-10-CM

## 2015-01-15 NOTE — Progress Notes (Signed)
Pre visit review using our clinic review tool, if applicable. No additional management support is needed unless otherwise documented below in the visit note. 

## 2015-01-15 NOTE — Progress Notes (Signed)
Subjective:    Patient ID: Brian Todd, male    DOB: [birth year], 69 y.o.   MRN: [id number]  HPI  Brian Todd is a 69 year old male who presents today for hospital follow up. He had a cardiac catheterization on 6/15 which required stent placement which was inserted on 6/16. He presented to the [facility] on 6/19 for further evaluation of chest pain, abdominal pain with nausea and diarrhea. He was admitted under cardiology services and underwent testing including lab work. He was noted to have a mildly elevated troponin of 0.10 which decreased throughout his admission. His symptoms were thought to be related to his lipitor as he experienced abdominal pain shortly after taking, but his LFT's were unremarkable. He started taking Plavix and Protonix on 01/03/15. Lipitor was increased to 80 mg prior to his procedure and then decreased to 40 mg upon discharge.  Protonix was increased to twice daily dosing upon discharge.   Since discharge on 01/12/15 he continues to have left sided chest discomfort and lower abdominal discomfort. Denies diarrhea, vomiting. He doesn't noticed a difference with the twice daily dosing of the protonix. He has a follow up appointment with cardiology on July 11th and was told by cardiology in the hospital to schedule a sooner appointment. He called his cardiologist to arrange a sooner appointment and could not get one any sooner than July 11th. During his left sided chest discomfort (describes as a cramp) his pain moves to different locations to his chest. He was told by cardiology in the hospital not to be alarmed of his chest discomfort as it moves to various locations. Denies nausea, diaphoresis, radiation of pain during chest discomfort.   Review of Systems  Constitutional: Negative for diaphoresis.  Respiratory: Negative for shortness of breath.   Cardiovascular: Positive for chest pain.  Gastrointestinal: Negative for nausea and diarrhea.       Mild abdominal discomfort.    Neurological: Negative for dizziness, numbness and headaches.       Past Medical History  Diagnosis Date  . Hearing loss   . Hypertension   . Coronary artery disease   . Hyperlipidemia   . GERD (gastroesophageal reflux disease)   . IBS (irritable bowel syndrome)   . Gallbladder disease   . Increased prostate specific antigen (PSA) velocity   . DJD (degenerative joint disease)   . Colitis     History   Social History  . Marital Status: Married    Spouse Name: linda wrenn Tomczak  . Number of Children: 2  . Years of Education: N/A   Occupational History  . engineering     Retired   Social History Main Topics  . Smoking status: Never Smoker   . Smokeless tobacco: Never Used  . Alcohol Use: No     Comment: rare use  . Drug Use: No  . Sexual Activity: Yes   Other Topics Concern  . Not on file   Social History Narrative    Past Surgical History  Procedure Laterality Date  . Tonsillectomy and adenoidectomy      in the 2nd grade  . Coronary stent placement      Dr. [name]  . Colonoscopy    . Cardiac catheterization    . Coronary stent placement  01/07/2015    LAD  . Cardiac catheterization N/A 01/07/2015    Procedure: Left Heart Cath and Coronary Angiography;  Surgeon: [name], MD;  Location: [facility];  Service: Cardiovascular;  Laterality: N/A;  . Cardiac catheterization N/A 01/07/2015    Procedure: Coronary Stent Intervention;  Surgeon: [name], MD;  Location: [facility];  Service: Cardiovascular;  Laterality: N/A;    Family History  Problem Relation Age of Onset  . Stroke Mother     No Known Allergies  Current Outpatient Prescriptions on File Prior to Visit  Medication Sig Dispense Refill  . aspirin EC 81 MG tablet Take 1 tablet (81 mg total) by mouth daily.    Marland Kitchen atorvastatin (LIPITOR) 80 MG tablet Take 0.5 tablets (40 mg total) by mouth daily. 30 tablet 6  . clopidogrel (PLAVIX) 75 MG tablet Take 1 tablet (75 mg  total) by mouth daily. 30 tablet 3  . KRILL OIL 1000 MG CAPS Take 1 capsule by mouth daily.    . metoprolol (LOPRESSOR) 50 MG tablet TAKE 1/2 TABLET BY MOUTH 2 TIMES DAILY 90 tablet 3  . Multiple Vitamins-Minerals (MULTIVITAMIN & MINERAL PO) Take 1 tablet by mouth daily.    . nitroGLYCERIN (NITROSTAT) 0.4 MG SL tablet Place 1 tablet (0.4 mg total) under the tongue every 5 (five) minutes as needed for chest pain. 25 tablet 3  . pantoprazole (PROTONIX) 40 MG tablet Take 1 tablet (40 mg total) by mouth 2 (two) times daily before a meal. 30 tablet 3  . ramipril (ALTACE) 10 MG capsule Take 1 capsule (10 mg total) by mouth daily. 90 capsule 3   No current facility-administered medications on file prior to visit.    BP 120/68 mmHg  Pulse 58  Temp(Src) 97.7 F (36.5 C) (Oral)  Ht 5\' 8"  (1.727 m)  Wt 189 lb 6.4 oz (85.911 kg)  BMI 28.80 kg/m2  SpO2 96%    Objective:   Physical Exam  Constitutional: He is oriented to person, place, and time. He appears well-nourished.  Cardiovascular: Normal rate and regular rhythm.   Pulmonary/Chest: Effort normal and breath sounds normal.  Neurological: He is alert and oriented to person, place, and time.  Skin: Skin is warm and dry.          Assessment & Plan:  Hospital Follow Up:  Chest discomfort with abdominal pain, nausea, diarrhea that occurred 6/20. Catheterization with stent placement on 6/16. Mildly elevated troponins in hospital which were expected considering his recent procedure. Troponins naturally decreased throughout hospitalization and were nearly normal by discharge.  Suspect his discomfort is muscle cramping as it moves from one place on his chest to another (left and right).  No nausea, diaphoresis, radiation of pain, SOB.  Exam unremarkable. Follow up with cardiology scheduled for 7/11. Continue current medication regimen. Follow up sooner if pain becomes worse. Educated regarding symptoms of concern.  >25 minutes spent  face to face with patient, >50% spent counseling or coordinating care.

## 2015-01-15 NOTE — Patient Instructions (Addendum)
Continue medications that were changed in the hospital. Keep your appointment with Cardiology on July 11th.  Call cardiologist if your symptoms change in intensity. If you develop chest pressure with, radiation of pain up your neck or down your arm, breaking out in sweats please go to the emergency department.   It was nice meeting you!

## 2015-01-27 ENCOUNTER — Telehealth: Payer: Self-pay | Admitting: Family Medicine

## 2015-01-27 NOTE — Telephone Encounter (Signed)
I will see him then

## 2015-01-27 NOTE — Telephone Encounter (Signed)
Patient Name: Brian Todd  DOB: [birth year]    Initial Comment Caller states he found a tick on his ankle this morning   Nurse Assessment  Nurse: Leilani Merl, RN, Heather Date/Time (Eastern Time): 01/27/2015 1:33:28 PM  Confirm and document reason for call. If symptomatic, describe symptoms. ---Caller states he found a tick on his ankle this morning, it was not on there yesterday  Has the patient traveled out of the country within the last 30 days? ---Not Applicable  Does the patient require triage? ---Yes  Related visit to physician within the last 2 weeks? ---No  Does the PT have any chronic conditions? (i.e. diabetes, asthma, etc.) ---Yes  List chronic conditions. ---heart stent 3 weeks ago     Guidelines    Guideline Title Affirmed Question Affirmed Notes  Tick Bite [1] Probable deer tick AND [2] attached > 24 hours (or tick appears swollen, not flat) AND [3] occurred in an area where Lyme disease is common    Final Disposition User   Call PCP within 24 Hours Standifer, RN, Heather    Appt made with Dr. Glori Bickers tomorrow at 2 pm.

## 2015-01-27 NOTE — Telephone Encounter (Signed)
Pt has appt with Dr Glori Bickers on 01/28/15 at 2 PM.

## 2015-01-28 ENCOUNTER — Encounter: Payer: Self-pay | Admitting: Family Medicine

## 2015-01-28 ENCOUNTER — Ambulatory Visit (INDEPENDENT_AMBULATORY_CARE_PROVIDER_SITE_OTHER): Payer: Medicare HMO | Admitting: Family Medicine

## 2015-01-28 VITALS — BP 136/80 | HR 51 | Temp 98.6°F | Ht 68.0 in | Wt 189.0 lb

## 2015-01-28 DIAGNOSIS — W57XXXA Bitten or stung by nonvenomous insect and other nonvenomous arthropods, initial encounter: Secondary | ICD-10-CM | POA: Diagnosis not present

## 2015-01-28 DIAGNOSIS — T148 Other injury of unspecified body region: Secondary | ICD-10-CM

## 2015-01-28 NOTE — Assessment & Plan Note (Signed)
Tick is larger than a deer tick/ not engorged and still alive  Doubt the bite was substantial Disc otc tx of bite  Will watch for target shaped lesion /rash/fever/ joint change and update  Reassuring exam today

## 2015-01-28 NOTE — Progress Notes (Signed)
Subjective:    Patient ID: Brian Todd, male    DOB: 1945-12-31, 69 y.o.   MRN: [id number]  HPI Here with a tick bite  Bit him on the ankle - R  Found it on July 5th - and called to see if he needs to do anything  Was attached but not engorged Got it off himself - with his fingers  Still alive - brought it to the office in paper towel   At the site of tick bite - mild itching , small area of redness No target shape No fever or joint pain  No rash  Had a coronary stent after the last appt as well as colitis  Still gets  Twinge of discomfort in L chest - especially when bending over  Feels better overall than he did   Has done some walking (not jogging yet) Has PT upcoming F/u cardiol 7/11   Patient Active Problem List   Diagnosis Date Noted  . Effort angina 01/07/2015  . Chest pain 01/01/2015  . History of heart artery stent 01/01/2015  . Headache 01/01/2015  . Rectal bleeding 11/08/2014  . Colitis 11/08/2014  . Encounter for Medicare annual wellness exam 10/31/2014  . UTI (urinary tract infection) 04/28/2014  . Contact dermatitis 01/28/2013  . Tinea pedis 01/28/2013  . Physical exam, annual 09/14/2012  . DEGENERATIVE JOINT DISEASE 07/30/2009  . PSA, INCREASED 07/30/2009  . HEARING LOSS, MILD 11/27/2007  . Coronary atherosclerosis 11/27/2007  . IRRITABLE BOWEL SYNDROME 11/27/2007  . GALLBLADDER DISEASE 11/27/2007  . Hyperlipidemia 08/13/2007  . Essential hypertension 08/13/2007  . GERD 08/13/2007   Past Medical History  Diagnosis Date  . Hearing loss   . Hypertension   . Coronary artery disease   . Hyperlipidemia   . GERD (gastroesophageal reflux disease)   . IBS (irritable bowel syndrome)   . Gallbladder disease   . Increased prostate specific antigen (PSA) velocity   . DJD (degenerative joint disease)   . Colitis    Past Surgical History  Procedure Laterality Date  . Tonsillectomy and adenoidectomy      in the 2nd grade  . Coronary stent  placement      Dr. [name]  . Colonoscopy    . Cardiac catheterization    . Coronary stent placement  01/07/2015    LAD  . Cardiac catheterization N/A 01/07/2015    Procedure: Left Heart Cath and Coronary Angiography;  Surgeon: [name], MD;  Location: [facility];  Service: Cardiovascular;  Laterality: N/A;  . Cardiac catheterization N/A 01/07/2015    Procedure: Coronary Stent Intervention;  Surgeon: [name], MD;  Location: [facility];  Service: Cardiovascular;  Laterality: N/A;   History  Substance Use Topics  . Smoking status: Never Smoker   . Smokeless tobacco: Never Used  . Alcohol Use: No     Comment: rare use   Family History  Problem Relation Age of Onset  . Stroke Mother    No Known Allergies Current Outpatient Prescriptions on File Prior to Visit  Medication Sig Dispense Refill  . aspirin EC 81 MG tablet Take 1 tablet (81 mg total) by mouth daily.    Marland Kitchen atorvastatin (LIPITOR) 80 MG tablet Take 0.5 tablets (40 mg total) by mouth daily. 30 tablet 6  . clopidogrel (PLAVIX) 75 MG tablet Take 1 tablet (75 mg total) by mouth daily. 30 tablet 3  . KRILL OIL 1000 MG CAPS Take 1 capsule by mouth daily.    Marland Kitchen  metoprolol (LOPRESSOR) 50 MG tablet TAKE 1/2 TABLET BY MOUTH 2 TIMES DAILY 90 tablet 3  . Multiple Vitamins-Minerals (MULTIVITAMIN & MINERAL PO) Take 1 tablet by mouth daily.    . nitroGLYCERIN (NITROSTAT) 0.4 MG SL tablet Place 1 tablet (0.4 mg total) under the tongue every 5 (five) minutes as needed for chest pain. 25 tablet 3  . pantoprazole (PROTONIX) 40 MG tablet Take 1 tablet (40 mg total) by mouth 2 (two) times daily before a meal. 30 tablet 3  . ramipril (ALTACE) 10 MG capsule Take 1 capsule (10 mg total) by mouth daily. 90 capsule 3   No current facility-administered medications on file prior to visit.    Review of Systems Review of Systems  Constitutional: Negative for fever, appetite change, fatigue and unexpected weight change.    Eyes: Negative for pain and visual disturbance.  Respiratory: Negative for cough and shortness of breath.  pos for rib soreness  Cardiovascular: Negative for cp or palpitations    Gastrointestinal: Negative for nausea, diarrhea and constipation.  Genitourinary: Negative for urgency and frequency.  Skin: Negative for pallor or rash  pos for itchy insect bite  Neurological: Negative for weakness, light-headedness, numbness and headaches.  Hematological: Negative for adenopathy. Does not bruise/bleed easily.  Psychiatric/Behavioral: Negative for dysphoric mood. The patient is not nervous/anxious.         Objective:   Physical Exam  Constitutional: He appears well-developed and well-nourished. No distress.  HENT:  Head: Normocephalic and atraumatic.  Mouth/Throat: Oropharynx is clear and moist.  Eyes: Conjunctivae and EOM are normal. Pupils are equal, round, and reactive to light. Right eye exhibits no discharge. Left eye exhibits no discharge.  Neck: Normal range of motion. Neck supple.  Cardiovascular: Normal rate and regular rhythm.   Pulmonary/Chest: Effort normal and breath sounds normal. He exhibits no tenderness.  Abdominal: Soft. Bowel sounds are normal. He exhibits no distension. There is no tenderness.  Musculoskeletal: He exhibits no edema.  Lymphadenopathy:    He has no cervical adenopathy.  Neurological: He is alert.  Skin: Skin is warm and dry. No rash noted.  Small insect bite on R lateral ankle 2-4 mm of erythema and induration without target shape  No scabl or excoriation   Psychiatric: He has a normal mood and affect.          Assessment & Plan:   Problem List Items Addressed This Visit    Tick bite - Primary    Tick is larger than a deer tick/ not engorged and still alive  Doubt the bite was substantial Disc otc tx of bite  Will watch for target shaped lesion /rash/fever/ joint change and update  Reassuring exam today

## 2015-01-28 NOTE — Progress Notes (Signed)
Pre visit review using our clinic review tool, if applicable. No additional management support is needed unless otherwise documented below in the visit note. 

## 2015-01-28 NOTE — Patient Instructions (Signed)
I think your tick bite is benign  Watch for rash / joint swelling and pain/ or fever  If any symptoms develop please let me know  You can keep area clean with soap and water - and use otc cortisone cream as needed  Keep me updated please

## 2015-01-29 ENCOUNTER — Encounter (HOSPITAL_COMMUNITY)
Admission: RE | Admit: 2015-01-29 | Discharge: 2015-01-29 | Disposition: A | Discharge status: Home / Self Care | Payer: Medicare HMO | Source: Ambulatory Visit | Attending: Cardiovascular Disease | Admitting: Cardiovascular Disease

## 2015-01-29 DIAGNOSIS — Z48812 Encounter for surgical aftercare following surgery on the circulatory system: Secondary | ICD-10-CM | POA: Insufficient documentation

## 2015-01-29 DIAGNOSIS — Z955 Presence of coronary angioplasty implant and graft: Secondary | ICD-10-CM | POA: Insufficient documentation

## 2015-01-29 NOTE — Progress Notes (Signed)
Cardiac Rehab Medication Review by a Pharmacist  Does the patient  feel that his/her medications are working for him/her?  yes  Has the patient been experiencing any side effects to the medications prescribed?  no  Does the patient measure his/her own blood pressure or blood glucose at home?  yes - occasionally uses has BP cuff at home  Does the patient have any problems obtaining medications due to transportation or finances?   no  Understanding of regimen: good Understanding of indications: good Potential of compliance: excellent    Pharmacist comments: Patient reports good understanding of medication regimen and indications. No side effects or issues reported.    Lovena Le P Mehreen Azizi 01/29/2015 8:16 AM

## 2015-02-02 ENCOUNTER — Encounter: Payer: Self-pay | Admitting: Cardiovascular Disease

## 2015-02-02 ENCOUNTER — Ambulatory Visit (INDEPENDENT_AMBULATORY_CARE_PROVIDER_SITE_OTHER): Payer: Medicare HMO | Admitting: Cardiovascular Disease

## 2015-02-02 VITALS — BP 144/62 | HR 56 | Ht 68.0 in | Wt 190.5 lb

## 2015-02-02 DIAGNOSIS — E785 Hyperlipidemia, unspecified: Secondary | ICD-10-CM

## 2015-02-02 DIAGNOSIS — I10 Essential (primary) hypertension: Secondary | ICD-10-CM | POA: Diagnosis not present

## 2015-02-02 DIAGNOSIS — R079 Chest pain, unspecified: Secondary | ICD-10-CM

## 2015-02-02 DIAGNOSIS — I25118 Atherosclerotic heart disease of native coronary artery with other forms of angina pectoris: Secondary | ICD-10-CM | POA: Diagnosis not present

## 2015-02-02 NOTE — Assessment & Plan Note (Signed)
The patient's anginal symptoms resolved after recent angioplasty and drug-eluting stent placement to the mid LAD. Current upper epigastric and lower chest pain seems to be atypical. However, given her recent stent placement and residual disease in the left circumflex, I recommended proceeding with a treadmill nuclear stress test before starting cardiac rehabilitation next week. If cardiac workup is negative and symptoms persist, I recommend GI evaluation and abdominal ultrasound to evaluate for gallbladder disease.

## 2015-02-02 NOTE — Progress Notes (Signed)
Primary care physician: Dr. Glori Bickers  HPI  This is a pleasant 69 year old man who is here today for a follow-up visit regarding coronary artery disease. He has known history of coronary artery disease. He had a small non-ST elevation myocardial infarction in 2001. Cardiac catheterization showed a 95% mid LAD stenosis and 40% stenosis in OM branch. He had successful angioplasty and Taxus drug-eluting stent placement to the mid LAD without complications. He has other chronic medical conditions that include hypertension, hyperlipidemia and gastroesophageal reflux disease. He was seen last month with symptoms suggestive of class III angina in spite of medical therapy. Thus, I proceeded with cardiac catheterization which showed 99% in-stent restenosis in the mid LAD extending to the distal edge, 60% mid left circumflex stenosis and mild RCA disease. I performed successful angioplasty and drug-eluting stent placement to the mid LAD. He returned few days later with atypical chest pain and minimally elevated troponin. Some of his symptoms were felt to be due to side effects to high dose atorvastatin, switching Nexium to Protonix and adding Plavix. Thus, atorvastatin was decreased back to 40 mg daily and Protonix was increased to twice daily. He reports improvement in symptoms. However, continues to complain of intermittent upper epigastric aching feeling which is not related to food. This can last for a few hours. It's different from his GERD. Is also very different from his prior angina which manifested as burning sensation in the upper chest area. He had no symptoms of that since his recent stent. He is about to start cardiac rehabilitation next week.   No Known Allergies   Current Outpatient Prescriptions on File Prior to Visit  Medication Sig Dispense Refill  . aspirin EC 81 MG tablet Take 1 tablet (81 mg total) by mouth daily.    Marland Kitchen atorvastatin (LIPITOR) 80 MG tablet Take 0.5 tablets (40 mg total) by mouth  daily. 30 tablet 6  . clopidogrel (PLAVIX) 75 MG tablet Take 1 tablet (75 mg total) by mouth daily. 30 tablet 3  . KRILL OIL 1000 MG CAPS Take 1 capsule by mouth daily.    . metoprolol (LOPRESSOR) 50 MG tablet TAKE 1/2 TABLET BY MOUTH 2 TIMES DAILY 90 tablet 3  . Multiple Vitamins-Minerals (MULTIVITAMIN & MINERAL PO) Take 1 tablet by mouth daily.    . nitroGLYCERIN (NITROSTAT) 0.4 MG SL tablet Place 1 tablet (0.4 mg total) under the tongue every 5 (five) minutes as needed for chest pain. 25 tablet 3  . pantoprazole (PROTONIX) 40 MG tablet Take 1 tablet (40 mg total) by mouth 2 (two) times daily before a meal. 30 tablet 3  . ramipril (ALTACE) 10 MG capsule Take 1 capsule (10 mg total) by mouth daily. 90 capsule 3   No current facility-administered medications on file prior to visit.     Past Medical History  Diagnosis Date  . Hearing loss   . Hypertension   . Coronary artery disease   . Hyperlipidemia   . GERD (gastroesophageal reflux disease)   . IBS (irritable bowel syndrome)   . Gallbladder disease   . Increased prostate specific antigen (PSA) velocity   . DJD (degenerative joint disease)   . Colitis      Past Surgical History  Procedure Laterality Date  . Tonsillectomy and adenoidectomy      in the 2nd grade  . Coronary stent placement      Dr. [name]  . Colonoscopy    . Cardiac catheterization    . Coronary stent placement  01/07/2015  LAD  . Cardiac catheterization N/A 01/07/2015    Procedure: Left Heart Cath and Coronary Angiography;  Surgeon: [name], MD;  Location: [facility];  Service: Cardiovascular;  Laterality: N/A;  . Cardiac catheterization N/A 01/07/2015    Procedure: Coronary Stent Intervention;  Surgeon: [name], MD;  Location: [facility];  Service: Cardiovascular;  Laterality: N/A;     Family History  Problem Relation Age of Onset  . Stroke Mother      History   Social History  . Marital Status: Married     Spouse Name: linda wrenn Deoliveira  . Number of Children: 2  . Years of Education: N/A   Occupational History  . engineering     Retired   Social History Main Topics  . Smoking status: Never Smoker   . Smokeless tobacco: Never Used  . Alcohol Use: No     Comment: rare use  . Drug Use: No  . Sexual Activity: Yes   Other Topics Concern  . Not on file   Social History Narrative     ROS A 10 point review of system was performed. It is negative other than that mentioned in the history of present illness.   PHYSICAL EXAM   BP 144/62 mmHg  Pulse 56  Ht 5\' 8"  (1.727 m)  Wt 190 lb 8 oz (86.41 kg)  BMI 28.97 kg/m2 Constitutional: He is oriented to person, place, and time. He appears well-developed and well-nourished. No distress.  HENT: No nasal discharge.  Head: Normocephalic and atraumatic.  Eyes: Pupils are equal and round.  No discharge. Neck: Normal range of motion. Neck supple. No JVD present. No thyromegaly present.  Cardiovascular: Normal rate, regular rhythm, normal heart sounds. Exam reveals no gallop and no friction rub. No murmur heard.  Pulmonary/Chest: Effort normal and breath sounds normal. No stridor. No respiratory distress. He has no wheezes. He has no rales. He exhibits no tenderness.  Abdominal: Soft. Bowel sounds are normal. He exhibits no distension. There is no tenderness. There is no rebound and no guarding.  Musculoskeletal: Normal range of motion. He exhibits no edema and no tenderness.  Neurological: He is alert and oriented to person, place, and time. Coordination normal.  Skin: Skin is warm and dry. No rash noted. He is not diaphoretic. No erythema. No pallor.  Psychiatric: He has a normal mood and affect. His behavior is normal. Judgment and thought content normal.  Right radial pulse is normal with no hematoma.     EKG: Sinus  Bradycardia  WITHIN NORMAL LIMITS   ASSESSMENT AND PLAN

## 2015-02-02 NOTE — Patient Instructions (Addendum)
Medication Instructions:  Your physician recommends that you continue on your current medications as directed. Please refer to the Current Medication list given to you today.   Labwork: none  Testing/Procedures: Your physician has requested that you have a lexiscan myoview. For further information please visit [email or web address]. Please follow instruction sheet, as given.  [place]  Your caregiver has ordered a Stress Test with nuclear imaging. The purpose of this test is to evaluate the blood supply to your heart muscle. This procedure is referred to as a "Non-Invasive Stress Test." This is because other than having an IV started in your vein, nothing is inserted or "invades" your body. Cardiac stress tests are done to find areas of poor blood flow to the heart by determining the extent of coronary artery disease (CAD). Some patients exercise on a treadmill, which naturally increases the blood flow to your heart, while others who are  unable to walk on a treadmill due to physical limitations have a pharmacologic/chemical stress agent called Lexiscan . This medicine will mimic walking on a treadmill by temporarily increasing your coronary blood flow.   Please note: these test may take anywhere between 2-4 hours to complete  PLEASE REPORT TO [place] TO GO  Date of Procedure: Wednesday, July 13, 8:30am Arrival Time for Procedure: 8:00am  Instructions regarding medication:    __xx__:  Hold betablocker(s) night before procedure and morning of procedure. Do not take metoprolol the morning of your test  PLEASE NOTIFY THE OFFICE AT LEAST 24 HOURS IN ADVANCE IF YOU ARE UNABLE TO KEEP YOUR APPOINTMENT.  [phone] AND  PLEASE NOTIFY NUCLEAR MEDICINE AT [facility] AT LEAST 24 HOURS IN ADVANCE IF YOU ARE UNABLE TO KEEP YOUR APPOINTMENT. [phone]  How to prepare for your Myoview test:   Do not eat or drink after  midnight  No caffeine for 24 hours prior to test  No smoking 24 hours prior to test.  Your medication may be taken with water.  If your doctor stopped a medication because of this test, do not take that medication.  Ladies, please do not wear dresses.  Skirts or pants are appropriate. Please wear a short sleeve shirt.  No perfume, cologne or lotion.  Wear comfortable walking shoes. No heels!      Follow-Up: Your physician recommends that you schedule a follow-up appointment in: two months with Dr. Fletcher Anon.    Any Other Special Instructions Will Be Listed Below (If Applicable).  Exercise Stress Electrocardiogram An exercise stress electrocardiogram is a test that is done to evaluate the blood supply to your heart. This test may also be called exercise stress electrocardiography. The test is done while you are walking on a treadmill. The goal of this test is to raise your heart rate. This test is done to find areas of poor blood flow to the heart by determining the extent of coronary artery disease (CAD).   CAD is defined as narrowing in one or more heart (coronary) arteries of more than 70%. If you have an abnormal test result, this may mean that you are not getting adequate blood flow to your heart during exercise. Additional testing may be needed to understand why your test was abnormal. LET [facility] CARE PROVIDER KNOW ABOUT:   Any allergies you have.  All medicines you are taking, including vitamins, herbs, eye drops, creams, and over-the-counter medicines.  Previous problems you or members of your family have  had with the use of anesthetics.  Any blood disorders you have.  Previous surgeries you have had.  Medical conditions you have.  Possibility of pregnancy, if this applies. RISKS AND COMPLICATIONS Generally, this is a safe procedure. However, as with any procedure, complications can occur. Possible complications can include:  Pain or pressure in the following  areas:  Chest.  Jaw or neck.  Between your shoulder blades.  Radiating down your left arm.  Dizziness or light-headedness.  Shortness of breath.  Increased or irregular heartbeats.  Nausea or vomiting.  Heart attack (rare). BEFORE THE PROCEDURE  Avoid all forms of caffeine 24 hours before your test or as directed by your health care provider. This includes coffee, tea (even decaffeinated tea), caffeinated sodas, chocolate, cocoa, and certain pain medicines.  Follow your health care provider's instructions regarding eating and drinking before the test.  Take your medicines as directed at regular times with water unless instructed otherwise. Exceptions may include:  If you have diabetes, ask how you are to take your insulin or pills. It is common to adjust insulin dosing the morning of the test.  If you are taking beta-blocker medicines, it is important to talk to your health care provider about these medicines well before the date of your test. Taking beta-blocker medicines may interfere with the test. In some cases, these medicines need to be changed or stopped 24 hours or more before the test.  If you wear a nitroglycerin patch, it may need to be removed prior to the test. Ask your health care provider if the patch should be removed before the test.  If you use an inhaler for any breathing condition, bring it with you to the test.  If you are an outpatient, bring a snack so you can eat right after the stress phase of the test.  Do not smoke for 4 hours prior to the test or as directed by your health care provider.  Do not apply lotions, powders, creams, or oils on your chest prior to the test.  Wear loose-fitting clothes and comfortable shoes for the test. This test involves walking on a treadmill. PROCEDURE  Multiple patches (electrodes) will be put on your chest. If needed, small areas of your chest may have to be shaved to get better contact with the electrodes. Once  the electrodes are attached to your body, multiple wires will be attached to the electrodes and your heart rate will be monitored.  Your heart will be monitored both at rest and while exercising.  You will walk on a treadmill. The treadmill will be started at a slow pace. The treadmill speed and incline will gradually be increased to raise your heart rate. AFTER THE PROCEDURE  Your heart rate and blood pressure will be monitored after the test.  You may return to your normal schedule including diet, activities, and medicines, unless your health care provider tells you otherwise. Document Released: 07/08/2000 Document Revised: 07/16/2013 Document Reviewed: 03/18/2013 [facility] Patient Information 2015 [place], [place]. This information is not intended to replace advice given to you by your health care provider. Make sure you discuss any questions you have with your health care provider.

## 2015-02-02 NOTE — Assessment & Plan Note (Signed)
Continue treatment with atorvastatin with a target LDL of less than 70. 

## 2015-02-02 NOTE — Assessment & Plan Note (Signed)
Blood pressure is usually more controlled than this. Continue to monitor.

## 2015-02-04 ENCOUNTER — Encounter
Admission: RE | Admit: 2015-02-04 | Discharge: 2015-02-04 | Disposition: A | Discharge status: Home / Self Care | Payer: Medicare HMO | Source: Ambulatory Visit | Attending: Cardiovascular Disease | Admitting: Cardiovascular Disease

## 2015-02-04 DIAGNOSIS — R079 Chest pain, unspecified: Secondary | ICD-10-CM | POA: Insufficient documentation

## 2015-02-04 LAB — NM MYOCAR MULTI W/SPECT W/WALL MOTION / EF
CHL CUP NUCLEAR SDS: 0
CSEPHR: 92 %
Estimated workload: 9.3 METS
Exercise duration (min): 8 min
LV dias vol: 72 mL
LV sys vol: 21 mL
Peak BP: 142 mmHg
Peak HR: 142 {beats}/min
Rest HR: 60 {beats}/min
SRS: 0
SSS: 0
TID: 1.02

## 2015-02-04 MED ORDER — TECHNETIUM TC 99M SESTAMIBI - CARDIOLITE
30.0000 | Freq: Once | INTRAVENOUS | Status: AC | PRN
  Administered 2015-02-04: 09:00:00 31.89 via INTRAVENOUS

## 2015-02-04 MED ORDER — TECHNETIUM TC 99M SESTAMIBI - CARDIOLITE
13.0000 | Freq: Once | INTRAVENOUS | Status: AC | PRN
  Administered 2015-02-04: 08:00:00 13.95 via INTRAVENOUS

## 2015-02-09 ENCOUNTER — Encounter (HOSPITAL_COMMUNITY)
Admission: RE | Admit: 2015-02-09 | Discharge: 2015-02-09 | Disposition: A | Discharge status: Home / Self Care | Payer: Medicare HMO | Source: Ambulatory Visit | Attending: Cardiovascular Disease | Admitting: Cardiovascular Disease

## 2015-02-09 DIAGNOSIS — Z48812 Encounter for surgical aftercare following surgery on the circulatory system: Secondary | ICD-10-CM | POA: Diagnosis present

## 2015-02-09 DIAGNOSIS — Z955 Presence of coronary angioplasty implant and graft: Secondary | ICD-10-CM | POA: Diagnosis not present

## 2015-02-09 NOTE — Progress Notes (Signed)
Pt started exercise today in  cardiac rehab phase II program.  Pt tolerated light exercise without difficulty. VSS, telemetry- SB SR with activity sinus arrythmias noted asymptomatic.  Medication list reconciled.  Pt verbalized compliance with medications and denies barriers to compliance. PSYCHOSOCIAL ASSESSMENT:  PHQ-0. Pt exhibits positive coping skills, hopeful outlook with supportive family. No psychosocial needs identified at this time, no psychosocial interventions necessary. Pt completed QOL survey as a participant in CR.  Scores less than 21 are considered low. Pt scores well above ranged from 24.56 to 27.60/   Pt enjoys spending time with his 8 grand kids who live in walking distance from his home.   Pt cardiac rehab  goal is to gain confidence to do things again - able to play with grand kids. P Pt encouraged to participate in home exercise on a consistent basis to increase his  ability to achieve these goals.   Pt long term cardiac rehab goal is lose weight. Pt desires to weigh 175 -180 pounds.  Will encourage pt to attend nutrition classes.  Will monitor his reported weight and continue to encourage and support pt.   Pt oriented to exercise equipment and routine.  Understanding verbalized. Cherre Huger, BSN

## 2015-02-11 ENCOUNTER — Encounter (HOSPITAL_COMMUNITY)
Admission: RE | Admit: 2015-02-11 | Discharge: 2015-02-11 | Disposition: A | Discharge status: Home / Self Care | Payer: Medicare HMO | Source: Ambulatory Visit | Attending: Cardiovascular Disease | Admitting: Cardiovascular Disease

## 2015-02-11 DIAGNOSIS — Z48812 Encounter for surgical aftercare following surgery on the circulatory system: Secondary | ICD-10-CM | POA: Diagnosis not present

## 2015-02-13 ENCOUNTER — Encounter (HOSPITAL_COMMUNITY)
Admission: RE | Admit: 2015-02-13 | Discharge: 2015-02-13 | Disposition: A | Discharge status: Home / Self Care | Payer: Medicare HMO | Source: Ambulatory Visit | Attending: Cardiovascular Disease | Admitting: Cardiovascular Disease

## 2015-02-13 DIAGNOSIS — Z48812 Encounter for surgical aftercare following surgery on the circulatory system: Secondary | ICD-10-CM | POA: Diagnosis not present

## 2015-02-16 ENCOUNTER — Encounter (HOSPITAL_COMMUNITY)
Admission: RE | Admit: 2015-02-16 | Discharge: 2015-02-16 | Disposition: A | Discharge status: Home / Self Care | Payer: Medicare HMO | Source: Ambulatory Visit | Attending: Cardiovascular Disease | Admitting: Cardiovascular Disease

## 2015-02-16 DIAGNOSIS — Z48812 Encounter for surgical aftercare following surgery on the circulatory system: Secondary | ICD-10-CM | POA: Diagnosis not present

## 2015-02-18 ENCOUNTER — Encounter (HOSPITAL_COMMUNITY)
Admission: RE | Admit: 2015-02-18 | Discharge: 2015-02-18 | Disposition: A | Discharge status: Home / Self Care | Payer: Medicare HMO | Source: Ambulatory Visit | Attending: Cardiovascular Disease | Admitting: Cardiovascular Disease

## 2015-02-18 DIAGNOSIS — Z48812 Encounter for surgical aftercare following surgery on the circulatory system: Secondary | ICD-10-CM | POA: Diagnosis not present

## 2015-02-18 NOTE — Progress Notes (Signed)
Reviewed home exercise with pt today.  Pt plans to continue walking at home and to return to using his treadmill on days that he cannot get outside for exercise.  Reviewed THR, pulse, RPE, sign and symptoms, NTG use, and when to call 911 or MD.  Pt voiced understanding. Alberteen Sam, MA, ACSM RCEP

## 2015-02-20 ENCOUNTER — Encounter (HOSPITAL_COMMUNITY)
Admission: RE | Admit: 2015-02-20 | Discharge: 2015-02-20 | Disposition: A | Discharge status: Home / Self Care | Payer: Medicare HMO | Source: Ambulatory Visit | Attending: Cardiovascular Disease | Admitting: Cardiovascular Disease

## 2015-02-20 DIAGNOSIS — Z48812 Encounter for surgical aftercare following surgery on the circulatory system: Secondary | ICD-10-CM | POA: Diagnosis not present

## 2015-02-23 ENCOUNTER — Telehealth: Payer: Self-pay | Admitting: *Deleted

## 2015-02-23 ENCOUNTER — Encounter (HOSPITAL_COMMUNITY)
Admission: RE | Admit: 2015-02-23 | Discharge: 2015-02-23 | Disposition: A | Discharge status: Home / Self Care | Payer: Medicare HMO | Source: Ambulatory Visit | Attending: Cardiovascular Disease | Admitting: Cardiovascular Disease

## 2015-02-23 DIAGNOSIS — Z48812 Encounter for surgical aftercare following surgery on the circulatory system: Secondary | ICD-10-CM | POA: Insufficient documentation

## 2015-02-23 DIAGNOSIS — Z955 Presence of coronary angioplasty implant and graft: Secondary | ICD-10-CM | POA: Diagnosis not present

## 2015-02-23 NOTE — Telephone Encounter (Signed)
Pt is planning on going on Vacation and will be flying He is coming to see [place] on 04/09/15  He is planning on flying on the 16 th  He is post stent had in June 15 Wants to know if he is okay to do this Pt states he is doing fine  Please advise   *Needs to know something soon, for he is trying to go on a cruise and the rooms are not as many*

## 2015-02-23 NOTE — Telephone Encounter (Signed)
LM for pt that question regarding flying will be forwarded to MD to advise.

## 2015-02-23 NOTE — Telephone Encounter (Signed)
Patient is s/p PCI with residual atypical symptoms. He underwent nuclear stress test post PCI on 02/04/2015 that was normal. He just recently started cardiac rehab. In general having PCI does not preclude someone from [facility]. However, this will have to be a choice he makes on his own.

## 2015-02-24 NOTE — Telephone Encounter (Signed)
S/w pt regarding Ryan's recommendations. Pt states he feels good and will proceed with vacation plans. Confirmed 9/15 appt with Dr. Fletcher Anon. Pt states he will be here.

## 2015-02-25 ENCOUNTER — Encounter (HOSPITAL_COMMUNITY)
Admission: RE | Admit: 2015-02-25 | Discharge: 2015-02-25 | Disposition: A | Discharge status: Home / Self Care | Payer: Medicare HMO | Source: Ambulatory Visit | Attending: Cardiovascular Disease | Admitting: Cardiovascular Disease

## 2015-02-25 DIAGNOSIS — Z48812 Encounter for surgical aftercare following surgery on the circulatory system: Secondary | ICD-10-CM | POA: Diagnosis not present

## 2015-02-27 ENCOUNTER — Encounter (HOSPITAL_COMMUNITY)
Admission: RE | Admit: 2015-02-27 | Discharge: 2015-02-27 | Disposition: A | Discharge status: Home / Self Care | Payer: Medicare HMO | Source: Ambulatory Visit | Attending: Cardiovascular Disease | Admitting: Cardiovascular Disease

## 2015-02-27 DIAGNOSIS — Z48812 Encounter for surgical aftercare following surgery on the circulatory system: Secondary | ICD-10-CM | POA: Diagnosis not present

## 2015-03-02 ENCOUNTER — Encounter (HOSPITAL_COMMUNITY)
Admission: RE | Admit: 2015-03-02 | Discharge: 2015-03-02 | Disposition: A | Discharge status: Home / Self Care | Payer: Medicare HMO | Source: Ambulatory Visit | Attending: Cardiovascular Disease | Admitting: Cardiovascular Disease

## 2015-03-02 DIAGNOSIS — Z48812 Encounter for surgical aftercare following surgery on the circulatory system: Secondary | ICD-10-CM | POA: Diagnosis not present

## 2015-03-02 NOTE — Progress Notes (Signed)
Brian Todd 69 y.o. male Nutrition Note Spoke with pt.  Nutrition Survey reviewed with pt. Pt is following Step 2 of the Therapeutic Lifestyle Changes diet. Pt wants to lose wt. Wt loss tips briefly discussed. Pt expressed understanding of the information reviewed. Pt aware of nutrition education classes offered. No results found for: HGBA1C Wt Readings from Last 3 Encounters:  02/02/15 190 lb 8 oz (86.41 kg)  01/29/15 187 lb 13.3 oz (85.2 kg)  01/28/15 189 lb (85.73 kg)   Nutrition Diagnosis ? Food-and nutrition-related knowledge deficit related to lack of exposure to information as related to diagnosis of: ? CVD ? Overweight related to excessive energy intake as evidenced by a BMI of 28.6  Nutrition Intervention ? Benefits of adopting Therapeutic Lifestyle Changes discussed when Medficts reviewed. ? Pt to attend the Portion Distortion class ? Pt to attend the  ? Nutrition I class                     ? Nutrition II class ? Continue client-centered nutrition education by RD, as part of interdisciplinary care.  Goal(s) ? Pt to identify food quantities necessary to achieve: ? wt loss to a goal wt of 163-181 lb (74.3-82.5 kg) at graduation from cardiac rehab.   Monitor and Evaluate progress toward nutrition goal with team.  Derek Mound, M.Ed, RD, LDN, CDE 03/02/2015 12:22 PM

## 2015-03-04 ENCOUNTER — Encounter (HOSPITAL_COMMUNITY)
Admission: RE | Admit: 2015-03-04 | Discharge: 2015-03-04 | Disposition: A | Discharge status: Home / Self Care | Payer: Medicare HMO | Source: Ambulatory Visit | Attending: Cardiovascular Disease | Admitting: Cardiovascular Disease

## 2015-03-04 DIAGNOSIS — Z48812 Encounter for surgical aftercare following surgery on the circulatory system: Secondary | ICD-10-CM | POA: Diagnosis not present

## 2015-03-06 ENCOUNTER — Encounter (HOSPITAL_COMMUNITY)
Admission: RE | Admit: 2015-03-06 | Discharge: 2015-03-06 | Disposition: A | Discharge status: Home / Self Care | Payer: Medicare HMO | Source: Ambulatory Visit | Attending: Cardiovascular Disease | Admitting: Cardiovascular Disease

## 2015-03-06 DIAGNOSIS — Z48812 Encounter for surgical aftercare following surgery on the circulatory system: Secondary | ICD-10-CM | POA: Diagnosis not present

## 2015-03-09 ENCOUNTER — Encounter (HOSPITAL_COMMUNITY)
Admission: RE | Admit: 2015-03-09 | Discharge: 2015-03-09 | Disposition: A | Discharge status: Home / Self Care | Payer: Medicare HMO | Source: Ambulatory Visit | Attending: Cardiovascular Disease | Admitting: Cardiovascular Disease

## 2015-03-09 DIAGNOSIS — Z48812 Encounter for surgical aftercare following surgery on the circulatory system: Secondary | ICD-10-CM | POA: Diagnosis not present

## 2015-03-11 ENCOUNTER — Encounter (HOSPITAL_COMMUNITY)
Admission: RE | Admit: 2015-03-11 | Discharge: 2015-03-11 | Disposition: A | Discharge status: Home / Self Care | Payer: Medicare HMO | Source: Ambulatory Visit | Attending: Cardiovascular Disease | Admitting: Cardiovascular Disease

## 2015-03-11 DIAGNOSIS — Z48812 Encounter for surgical aftercare following surgery on the circulatory system: Secondary | ICD-10-CM | POA: Diagnosis not present

## 2015-03-13 ENCOUNTER — Encounter (HOSPITAL_COMMUNITY)
Admission: RE | Admit: 2015-03-13 | Discharge: 2015-03-13 | Disposition: A | Discharge status: Home / Self Care | Payer: Medicare HMO | Source: Ambulatory Visit | Attending: Cardiovascular Disease | Admitting: Cardiovascular Disease

## 2015-03-13 DIAGNOSIS — Z48812 Encounter for surgical aftercare following surgery on the circulatory system: Secondary | ICD-10-CM | POA: Diagnosis not present

## 2015-03-16 ENCOUNTER — Encounter (HOSPITAL_COMMUNITY)
Admission: RE | Admit: 2015-03-16 | Discharge: 2015-03-16 | Disposition: A | Discharge status: Home / Self Care | Payer: Medicare HMO | Source: Ambulatory Visit | Attending: Cardiovascular Disease | Admitting: Cardiovascular Disease

## 2015-03-16 DIAGNOSIS — Z48812 Encounter for surgical aftercare following surgery on the circulatory system: Secondary | ICD-10-CM | POA: Diagnosis not present

## 2015-03-18 ENCOUNTER — Encounter (HOSPITAL_COMMUNITY)
Admission: RE | Admit: 2015-03-18 | Discharge: 2015-03-18 | Disposition: A | Discharge status: Home / Self Care | Payer: Medicare HMO | Source: Ambulatory Visit | Attending: Cardiovascular Disease | Admitting: Cardiovascular Disease

## 2015-03-18 DIAGNOSIS — Z48812 Encounter for surgical aftercare following surgery on the circulatory system: Secondary | ICD-10-CM | POA: Diagnosis not present

## 2015-03-20 ENCOUNTER — Encounter (HOSPITAL_COMMUNITY)
Admission: RE | Admit: 2015-03-20 | Discharge: 2015-03-20 | Disposition: A | Discharge status: Home / Self Care | Payer: Medicare HMO | Source: Ambulatory Visit | Attending: Cardiovascular Disease | Admitting: Cardiovascular Disease

## 2015-03-20 DIAGNOSIS — Z48812 Encounter for surgical aftercare following surgery on the circulatory system: Secondary | ICD-10-CM | POA: Diagnosis not present

## 2015-03-23 ENCOUNTER — Encounter (HOSPITAL_COMMUNITY)
Admission: RE | Admit: 2015-03-23 | Discharge: 2015-03-23 | Disposition: A | Discharge status: Home / Self Care | Payer: Medicare HMO | Source: Ambulatory Visit | Attending: Cardiovascular Disease | Admitting: Cardiovascular Disease

## 2015-03-23 DIAGNOSIS — Z48812 Encounter for surgical aftercare following surgery on the circulatory system: Secondary | ICD-10-CM | POA: Diagnosis not present

## 2015-03-25 ENCOUNTER — Encounter (HOSPITAL_COMMUNITY)
Admission: RE | Admit: 2015-03-25 | Discharge: 2015-03-25 | Disposition: A | Discharge status: Home / Self Care | Payer: Medicare HMO | Source: Ambulatory Visit | Attending: Cardiovascular Disease | Admitting: Cardiovascular Disease

## 2015-03-25 DIAGNOSIS — Z48812 Encounter for surgical aftercare following surgery on the circulatory system: Secondary | ICD-10-CM | POA: Diagnosis not present

## 2015-03-27 ENCOUNTER — Encounter (HOSPITAL_COMMUNITY)
Admission: RE | Admit: 2015-03-27 | Discharge: 2015-03-27 | Disposition: A | Discharge status: Home / Self Care | Payer: Medicare HMO | Source: Ambulatory Visit | Attending: Cardiovascular Disease | Admitting: Cardiovascular Disease

## 2015-03-27 DIAGNOSIS — Z48812 Encounter for surgical aftercare following surgery on the circulatory system: Secondary | ICD-10-CM | POA: Insufficient documentation

## 2015-03-27 DIAGNOSIS — Z955 Presence of coronary angioplasty implant and graft: Secondary | ICD-10-CM | POA: Diagnosis not present

## 2015-03-31 ENCOUNTER — Encounter: Payer: Self-pay | Admitting: Family Medicine

## 2015-03-31 ENCOUNTER — Ambulatory Visit (INDEPENDENT_AMBULATORY_CARE_PROVIDER_SITE_OTHER): Payer: Medicare HMO | Admitting: Family Medicine

## 2015-03-31 VITALS — BP 136/76 | HR 60 | Temp 98.2°F | Ht 68.0 in | Wt 181.8 lb

## 2015-03-31 DIAGNOSIS — R1032 Left lower quadrant pain: Secondary | ICD-10-CM | POA: Diagnosis not present

## 2015-03-31 DIAGNOSIS — R1030 Lower abdominal pain, unspecified: Secondary | ICD-10-CM

## 2015-03-31 DIAGNOSIS — R1031 Right lower quadrant pain: Secondary | ICD-10-CM | POA: Diagnosis not present

## 2015-03-31 LAB — POCT URINALYSIS DIPSTICK
BILIRUBIN UA: NEGATIVE
Glucose, UA: NEGATIVE
KETONES UA: NEGATIVE
LEUKOCYTES UA: NEGATIVE
NITRITE UA: NEGATIVE
Protein, UA: NEGATIVE
RBC UA: NEGATIVE
Spec Grav, UA: 1.015
UROBILINOGEN UA: 0.2
pH, UA: 6

## 2015-03-31 MED ORDER — SCOPOLAMINE 1 MG/3DAYS TD PT72: 1.0000 | MEDICATED_PATCH | TRANSDERMAL | Status: DC

## 2015-03-31 MED ORDER — CIPROFLOXACIN HCL 500 MG PO TABS: 500.0000 mg | ORAL_TABLET | Freq: Two times a day (BID) | ORAL | Status: DC

## 2015-03-31 MED ORDER — METRONIDAZOLE 500 MG PO TABS: 500.0000 mg | ORAL_TABLET | Freq: Three times a day (TID) | ORAL | Status: DC

## 2015-03-31 NOTE — Progress Notes (Signed)
Subjective:    Patient ID: Brian Todd, male    DOB: 1946/05/09, 69 y.o.   MRN: [id number]  HPI Here with abd pain   Of note has had colitis in the past  Was tx with cipro and flagyl/in hosp in 4/16  Has diverticulosis  Was thought poss ischemic colitis Saw GI and had colonosc in 5/16   This time no bleeding  Pain is similar but not severe  Enough to slow him down a bit  Tolerates pain pretty well  No symptoms at night /when asleep  Having regular BMs  Feeling of fullness in lower abdomen - and occ pains on one side or the other - sensitive  Moves around   Has not felt feverish  Just flushed when he has the pain  No n/v at all  Bowel habits are the same   He did have some popcorn Friday  Symptoms started Saturday  The week before he was gassy   Trying to loose wt with 9 lb loss since July  Intentional and exercising     Results for orders placed or performed in visit on 03/31/15  POCT urinalysis dipstick  Result Value Ref Range   Color, UA Light Yellow    Clarity, UA Clear    Glucose, UA Neg.    Bilirubin, UA Neg.    Ketones, UA Neg.    Spec Grav, UA 1.015    Blood, UA Neg.    pH, UA 6.0    Protein, UA Neg.    Urobilinogen, UA 0.2    Nitrite, UA Neg.    Leukocytes, UA Negative Negative      Patient Active Problem List   Diagnosis Date Noted  . Tick bite 01/28/2015  . Effort angina 01/07/2015  . Chest pain 01/01/2015  . History of heart artery stent 01/01/2015  . Headache 01/01/2015  . Rectal bleeding 11/08/2014  . Colitis 11/08/2014  . Encounter for Medicare annual wellness exam 10/31/2014  . UTI (urinary tract infection) 04/28/2014  . Contact dermatitis 01/28/2013  . Tinea pedis 01/28/2013  . Physical exam, annual 09/14/2012  . DEGENERATIVE JOINT DISEASE 07/30/2009  . PSA, INCREASED 07/30/2009  . HEARING LOSS, MILD 11/27/2007  . Coronary atherosclerosis 11/27/2007  . IRRITABLE BOWEL SYNDROME 11/27/2007  . GALLBLADDER DISEASE 11/27/2007    . Hyperlipidemia 08/13/2007  . Essential hypertension 08/13/2007  . GERD 08/13/2007   Past Medical History  Diagnosis Date  . Hearing loss   . Hypertension   . Coronary artery disease   . Hyperlipidemia   . GERD (gastroesophageal reflux disease)   . IBS (irritable bowel syndrome)   . Gallbladder disease   . Increased prostate specific antigen (PSA) velocity   . DJD (degenerative joint disease)   . Colitis    Past Surgical History  Procedure Laterality Date  . Tonsillectomy and adenoidectomy      in the 2nd grade  . Coronary stent placement      Dr. [name]  . Colonoscopy    . Cardiac catheterization    . Coronary stent placement  01/07/2015    LAD  . Cardiac catheterization N/A 01/07/2015    Procedure: Left Heart Cath and Coronary Angiography;  Surgeon: [name], MD;  Location: [facility];  Service: Cardiovascular;  Laterality: N/A;  . Cardiac catheterization N/A 01/07/2015    Procedure: Coronary Stent Intervention;  Surgeon: [name], MD;  Location: [facility];  Service: Cardiovascular;  Laterality: N/A;  Social History  Substance Use Topics  . Smoking status: Never Smoker   . Smokeless tobacco: Never Used  . Alcohol Use: No     Comment: rare use   Family History  Problem Relation Age of Onset  . Stroke Mother    No Known Allergies Current Outpatient Prescriptions on File Prior to Visit  Medication Sig Dispense Refill  . aspirin EC 81 MG tablet Take 1 tablet (81 mg total) by mouth daily.    Marland Kitchen atorvastatin (LIPITOR) 80 MG tablet Take 0.5 tablets (40 mg total) by mouth daily. 30 tablet 6  . clopidogrel (PLAVIX) 75 MG tablet Take 1 tablet (75 mg total) by mouth daily. 30 tablet 3  . metoprolol (LOPRESSOR) 50 MG tablet TAKE 1/2 TABLET BY MOUTH 2 TIMES DAILY 90 tablet 3  . Multiple Vitamins-Minerals (MULTIVITAMIN & MINERAL PO) Take 1 tablet by mouth daily.    . nitroGLYCERIN (NITROSTAT) 0.4 MG SL tablet Place 1 tablet (0.4 mg total) under  the tongue every 5 (five) minutes as needed for chest pain. 25 tablet 3  . pantoprazole (PROTONIX) 40 MG tablet Take 1 tablet (40 mg total) by mouth 2 (two) times daily before a meal. 30 tablet 3  . ramipril (ALTACE) 10 MG capsule Take 1 capsule (10 mg total) by mouth daily. 90 capsule 3   No current facility-administered medications on file prior to visit.    Review of Systems Review of Systems  Constitutional: Negative for fever, appetite change,  and unexpected weight change.  ENT neg for ST Eyes: Negative for pain and visual disturbance.  Respiratory: Negative for cough and shortness of breath.   Cardiovascular: Negative for cp or palpitations    Gastrointestinal: Negative for nausea, diarrhea and constipation. neg for blood in stool or dark stool , pos for bloating  Genitourinary: Negative for urgency and frequency. neg for hematuria  Skin: Negative for pallor or rash   Neurological: Negative for weakness, light-headedness, numbness and headaches.  Hematological: Negative for adenopathy. Does not bruise/bleed easily.  Psychiatric/Behavioral: Negative for dysphoric mood. The patient is not nervous/anxious.         Objective:   Physical Exam  Constitutional: He appears well-developed and well-nourished. No distress.  Well appearing/ not uncomfortable at time of exam  HENT:  Head: Normocephalic and atraumatic.  Mouth/Throat: Oropharynx is clear and moist.  Eyes: Conjunctivae and EOM are normal. Pupils are equal, round, and reactive to light. No scleral icterus.  Neck: Normal range of motion. Neck supple.  Cardiovascular: Normal rate, regular rhythm and normal heart sounds.   Pulmonary/Chest: Effort normal and breath sounds normal. No respiratory distress. He has no wheezes. He has no rales.  Abdominal: Soft. Bowel sounds are normal. He exhibits no distension, no abdominal bruit, no pulsatile midline mass and no mass. There is no hepatosplenomegaly. There is tenderness in the right  lower quadrant and left lower quadrant. There is no rigidity, no rebound, no guarding, no CVA tenderness, no tenderness at McBurney's point and negative Murphy's sign.  Tenderness is fairly mild   Musculoskeletal: He exhibits no edema.  Lymphadenopathy:    He has no cervical adenopathy.  Neurological: He is alert.  Skin: Skin is warm and dry. No erythema. No pallor.  Psychiatric: He has a normal mood and affect.          Assessment & Plan:   Problem List Items Addressed This Visit      Other   Bilateral lower abdominal pain    Reviewed  hx of hosp for similar pain in 4/06 (that time with bleeding), and also GI visit and colonosc ? Ischemic colitis and diverticulosis Both in the differential  Pt did recently eat popcorn - disc role of nuts/seeds in diverticulitis  Will tx with cipro and flagyl Diet: clears to liquids and bland foods  Update if not starting to improve in a week or if worsening  If worse - may need supportive tx inpt        Other Visit Diagnoses    Lower abdominal pain    -  Primary    Relevant Orders    POCT urinalysis dipstick (Completed)

## 2015-03-31 NOTE — Progress Notes (Signed)
Pre visit review using our clinic review tool, if applicable. No additional management support is needed unless otherwise documented below in the visit note. 

## 2015-03-31 NOTE — Patient Instructions (Signed)
Start flagyl and cipro for possible diverticulitis  Keep up your fluid intake Keep diet bland this week  From now on avoid nuts/seeds and popcorn   If symptoms do not improve or worsen - let us know  If much worse abdominal pain -please go to the hospital

## 2015-04-01 ENCOUNTER — Encounter (HOSPITAL_COMMUNITY): Payer: Medicare HMO

## 2015-04-01 NOTE — Assessment & Plan Note (Signed)
Reviewed hx of hosp for similar pain in 4/06 (that time with bleeding), and also GI visit and colonosc ? Ischemic colitis and diverticulosis Both in the differential  Pt did recently eat popcorn - disc role of nuts/seeds in diverticulitis  Will tx with cipro and flagyl Diet: clears to liquids and bland foods  Update if not starting to improve in a week or if worsening  If worse - may need supportive tx inpt

## 2015-04-03 ENCOUNTER — Ambulatory Visit (INDEPENDENT_AMBULATORY_CARE_PROVIDER_SITE_OTHER): Payer: Medicare HMO | Admitting: Family Medicine

## 2015-04-03 ENCOUNTER — Encounter: Payer: Self-pay | Admitting: Family Medicine

## 2015-04-03 ENCOUNTER — Telehealth (HOSPITAL_COMMUNITY): Payer: Self-pay | Admitting: *Deleted

## 2015-04-03 ENCOUNTER — Encounter (HOSPITAL_COMMUNITY): Payer: Medicare HMO

## 2015-04-03 VITALS — BP 136/90 | HR 52 | Temp 97.7°F | Ht 68.0 in | Wt 180.2 lb

## 2015-04-03 DIAGNOSIS — R1031 Right lower quadrant pain: Secondary | ICD-10-CM

## 2015-04-03 DIAGNOSIS — K529 Noninfective gastroenteritis and colitis, unspecified: Secondary | ICD-10-CM

## 2015-04-03 DIAGNOSIS — R1032 Left lower quadrant pain: Secondary | ICD-10-CM

## 2015-04-03 LAB — CBC WITH DIFFERENTIAL/PLATELET
BASOS ABS: 0 10*3/uL (ref 0.0–0.1)
BASOS PCT: 0.5 % (ref 0.0–3.0)
Eosinophils Absolute: 0.1 10*3/uL (ref 0.0–0.7)
Eosinophils Relative: 0.7 % (ref 0.0–5.0)
HCT: 45.2 % (ref 39.0–52.0)
Hemoglobin: 14.9 g/dL (ref 13.0–17.0)
Lymphocytes Relative: 21.4 % (ref 12.0–46.0)
Lymphs Abs: 1.7 10*3/uL (ref 0.7–4.0)
MCHC: 33 g/dL (ref 30.0–36.0)
MCV: 92.3 fl (ref 78.0–100.0)
MONOS PCT: 7.4 % (ref 3.0–12.0)
Monocytes Absolute: 0.6 10*3/uL (ref 0.1–1.0)
NEUTROS ABS: 5.6 10*3/uL (ref 1.4–7.7)
Neutrophils Relative %: 70 % (ref 43.0–77.0)
PLATELETS: 216 10*3/uL (ref 150.0–400.0)
RBC: 4.9 Mil/uL (ref 4.22–5.81)
RDW: 13.3 % (ref 11.5–15.5)
WBC: 8 10*3/uL (ref 4.0–10.5)

## 2015-04-03 LAB — COMPREHENSIVE METABOLIC PANEL
ALT: 54 U/L — AB (ref 0–53)
AST: 49 U/L — AB (ref 0–37)
Albumin: 4.3 g/dL (ref 3.5–5.2)
Alkaline Phosphatase: 95 U/L (ref 39–117)
BILIRUBIN TOTAL: 0.9 mg/dL (ref 0.2–1.2)
BUN: 15 mg/dL (ref 6–23)
CO2: 30 meq/L (ref 19–32)
Calcium: 10 mg/dL (ref 8.4–10.5)
Chloride: 104 mEq/L (ref 96–112)
Creatinine, Ser: 1.07 mg/dL (ref 0.40–1.50)
GFR: 73.17 mL/min (ref >60.00)
GLUCOSE: 89 mg/dL (ref 70–99)
Potassium: 5.4 mEq/L — ABNORMAL HIGH (ref 3.5–5.1)
SODIUM: 141 meq/L (ref 135–145)
Total Protein: 7 g/dL (ref 6.0–8.3)

## 2015-04-03 NOTE — Assessment & Plan Note (Signed)
No better or worse after 3 d of abx Reassuring exam - very mild tenderness (if any) No fever or new symptoms  inst to start citrucel daily Lab today -CBC and cmet  If worse -seek care (severe pain/ blood in stool or fever) diveticulitis and isch colitis still in diff / also IBS -rev recent reassuring colonoscopy  If not imp by Monday will arrange GI f/u

## 2015-04-03 NOTE — Patient Instructions (Signed)
Labs today  It is reassuring you are not worse  If worse/fever or other new symptoms - let us know and seek care  If not improved Monday I will work on getting you a GI follow up  Start a fiber supplement - get citrucel over the counter - take with once daily mixed with water

## 2015-04-03 NOTE — Progress Notes (Signed)
Pre visit review using our clinic review tool, if applicable. No additional management support is needed unless otherwise documented below in the visit note. 

## 2015-04-03 NOTE — Progress Notes (Signed)
Subjective:    Patient ID: Brian Todd, male    DOB: [birth year], 69 y.o.   MRN: [id number]  HPI Here for f/u of abdominal pain (f/u from last visit)   Last seen - tx empirically with cipro/ flagyl  Up and down with symptoms but about the same (since Tuesday) - 3 days   Felt feverish (flushedness) once - but no temp on thermometer  A little bit of queasiness  Small amt of discomfort in epigastric area / no gerd symptoms however   No nuts or seeds since he was seen   No blood in stool  ? If hx of IBS  Is going on a cruise in a week  Symptoms are not severe at all   Hx of colitis in the spring - rev CT and other tests and colonoscopy  ? If ischemic colitis , ? If diverticulitis   Patient Active Problem List   Diagnosis Date Noted  . Bilateral lower abdominal pain 03/31/2015  . Tick bite 01/28/2015  . Effort angina 01/07/2015  . Chest pain 01/01/2015  . History of heart artery stent 01/01/2015  . Headache 01/01/2015  . Rectal bleeding 11/08/2014  . Colitis 11/08/2014  . Encounter for Medicare annual wellness exam 10/31/2014  . UTI (urinary tract infection) 04/28/2014  . Contact dermatitis 01/28/2013  . Tinea pedis 01/28/2013  . Physical exam, annual 09/14/2012  . DEGENERATIVE JOINT DISEASE 07/30/2009  . PSA, INCREASED 07/30/2009  . HEARING LOSS, MILD 11/27/2007  . Coronary atherosclerosis 11/27/2007  . IRRITABLE BOWEL SYNDROME 11/27/2007  . GALLBLADDER DISEASE 11/27/2007  . Hyperlipidemia 08/13/2007  . Essential hypertension 08/13/2007  . GERD 08/13/2007   Past Medical History  Diagnosis Date  . Hearing loss   . Hypertension   . Coronary artery disease   . Hyperlipidemia   . GERD (gastroesophageal reflux disease)   . IBS (irritable bowel syndrome)   . Gallbladder disease   . Increased prostate specific antigen (PSA) velocity   . DJD (degenerative joint disease)   . Colitis    Past Surgical History  Procedure Laterality Date  . Tonsillectomy and  adenoidectomy      in the 2nd grade  . Coronary stent placement      Dr. [name]  . Colonoscopy    . Cardiac catheterization    . Coronary stent placement  01/07/2015    LAD  . Cardiac catheterization N/A 01/07/2015    Procedure: Left Heart Cath and Coronary Angiography;  Surgeon: [name], MD;  Location: [place] [facility];  Service: Cardiovascular;  Laterality: N/A;  . Cardiac catheterization N/A 01/07/2015    Procedure: Coronary Stent Intervention;  Surgeon: [name], MD;  Location: [place] [facility];  Service: Cardiovascular;  Laterality: N/A;   Social History  Substance Use Topics  . Smoking status: Never Smoker   . Smokeless tobacco: Never Used  . Alcohol Use: No     Comment: rare use   Family History  Problem Relation Age of Onset  . Stroke Mother    No Known Allergies Current Outpatient Prescriptions on File Prior to Visit  Medication Sig Dispense Refill  . aspirin EC 81 MG tablet Take 1 tablet (81 mg total) by mouth daily.    Marland Kitchen atorvastatin (LIPITOR) 80 MG tablet Take 0.5 tablets (40 mg total) by mouth daily. 30 tablet 6  . ciprofloxacin (CIPRO) 500 MG tablet Take 1 tablet (500 mg total) by mouth 2 (two) times daily. 20 tablet 0  .  clopidogrel (PLAVIX) 75 MG tablet Take 1 tablet (75 mg total) by mouth daily. 30 tablet 3  . metoprolol (LOPRESSOR) 50 MG tablet TAKE 1/2 TABLET BY MOUTH 2 TIMES DAILY 90 tablet 3  . metroNIDAZOLE (FLAGYL) 500 MG tablet Take 1 tablet (500 mg total) by mouth 3 (three) times daily. 30 tablet 0  . Multiple Vitamins-Minerals (MULTIVITAMIN & MINERAL PO) Take 1 tablet by mouth daily.    . nitroGLYCERIN (NITROSTAT) 0.4 MG SL tablet Place 1 tablet (0.4 mg total) under the tongue every 5 (five) minutes as needed for chest pain. 25 tablet 3  . Omega-3 Krill Oil 500 MG CAPS Take 1 capsule by mouth daily.    . pantoprazole (PROTONIX) 40 MG tablet Take 1 tablet (40 mg total) by mouth 2 (two) times daily before a meal. 30 tablet 3  . ramipril  (ALTACE) 10 MG capsule Take 1 capsule (10 mg total) by mouth daily. 90 capsule 3  . scopolamine (TRANSDERM-SCOP) 1 MG/3DAYS Place 1 patch (1.5 mg total) onto the skin every 3 (three) days. 10 patch 0   No current facility-administered medications on file prior to visit.     Review of Systems Review of Systems  Constitutional: Negative for fever, appetite change,  and unexpected weight change. pos for mild fatigue  Eyes: Negative for pain and visual disturbance.  Respiratory: Negative for cough and shortness of breath.   Cardiovascular: Negative for cp or palpitations    Gastrointestinal: Negative for nausea, diarrhea and constipation. neg for blood in stool or dark stool  Genitourinary: Negative for urgency and frequency. neg for hematuria (last ua was normal)  Skin: Negative for pallor or rash   Neurological: Negative for weakness, light-headedness, numbness and headaches.  Hematological: Negative for adenopathy. Does not bruise/bleed easily.  Psychiatric/Behavioral: Negative for dysphoric mood. The patient is not nervous/anxious.         Objective:   Physical Exam  Constitutional: He appears well-developed and well-nourished. No distress.  Well appearing   HENT:  Head: Normocephalic and atraumatic.  Mouth/Throat: Oropharynx is clear and moist.  Eyes: Conjunctivae and EOM are normal. Pupils are equal, round, and reactive to light. No scleral icterus.  Neck: Normal range of motion. Neck supple.  Cardiovascular: Normal rate, regular rhythm and normal heart sounds.   Pulmonary/Chest: Effort normal and breath sounds normal. No respiratory distress. He has no wheezes. He has no rales.  Abdominal: Soft. Bowel sounds are normal. He exhibits no distension, no pulsatile liver, no abdominal bruit and no mass. There is no hepatosplenomegaly. There is tenderness in the right lower quadrant and left lower quadrant. There is no rigidity, no rebound, no guarding, no CVA tenderness, no tenderness at  McBurney's point and negative Murphy's sign.  Very mild LQ tenderness bilaterally without rebound or guarding (less than last exam)  No suprapubic tenderness or fullness   No cva tenderness     Musculoskeletal: He exhibits no edema.  Lymphadenopathy:    He has no cervical adenopathy.  Neurological: He is alert.  Skin: Skin is warm and dry. No erythema. No pallor.  No jaundice or icterus   Psychiatric: He has a normal mood and affect.          Assessment & Plan:   Problem List Items Addressed This Visit      Digestive   Colitis    See assessment for abd pain - recent vague low abd discomfort is no better or worse with 3d of flagyl and cipro  Lab  today  Disc poss of ischemic colitis - but symptoms are mild  Consider f/u GI         Other   Bilateral lower abdominal pain - Primary    No better or worse after 3 d of abx Reassuring exam - very mild tenderness (if any) No fever or new symptoms  inst to start citrucel daily Lab today -CBC and cmet  If worse -seek care (severe pain/ blood in stool or fever) diveticulitis and isch colitis still in diff / also IBS -rev recent reassuring colonoscopy  If not imp by Monday will arrange GI f/u       Relevant Orders   CBC with Differential/Platelet (Completed)   Comprehensive metabolic panel (Completed)

## 2015-04-04 ENCOUNTER — Emergency Department (HOSPITAL_COMMUNITY)
Admission: EM | Admit: 2015-04-04 | Discharge: 2015-04-04 | Disposition: A | Discharge status: Home / Self Care | Payer: Medicare HMO | Attending: Emergency Medicine | Admitting: Emergency Medicine

## 2015-04-04 ENCOUNTER — Emergency Department (HOSPITAL_COMMUNITY): Payer: Medicare HMO

## 2015-04-04 ENCOUNTER — Encounter (HOSPITAL_COMMUNITY): Payer: Self-pay | Admitting: Emergency Medicine

## 2015-04-04 DIAGNOSIS — H9190 Unspecified hearing loss, unspecified ear: Secondary | ICD-10-CM | POA: Diagnosis not present

## 2015-04-04 DIAGNOSIS — Z792 Long term (current) use of antibiotics: Secondary | ICD-10-CM | POA: Insufficient documentation

## 2015-04-04 DIAGNOSIS — R1031 Right lower quadrant pain: Secondary | ICD-10-CM | POA: Diagnosis not present

## 2015-04-04 DIAGNOSIS — M1990 Unspecified osteoarthritis, unspecified site: Secondary | ICD-10-CM | POA: Diagnosis not present

## 2015-04-04 DIAGNOSIS — K219 Gastro-esophageal reflux disease without esophagitis: Secondary | ICD-10-CM | POA: Diagnosis not present

## 2015-04-04 DIAGNOSIS — Z79899 Other long term (current) drug therapy: Secondary | ICD-10-CM | POA: Diagnosis not present

## 2015-04-04 DIAGNOSIS — R1030 Lower abdominal pain, unspecified: Secondary | ICD-10-CM

## 2015-04-04 DIAGNOSIS — Z7902 Long term (current) use of antithrombotics/antiplatelets: Secondary | ICD-10-CM | POA: Diagnosis not present

## 2015-04-04 DIAGNOSIS — Z9861 Coronary angioplasty status: Secondary | ICD-10-CM | POA: Insufficient documentation

## 2015-04-04 DIAGNOSIS — E785 Hyperlipidemia, unspecified: Secondary | ICD-10-CM | POA: Insufficient documentation

## 2015-04-04 DIAGNOSIS — I2510 Atherosclerotic heart disease of native coronary artery without angina pectoris: Secondary | ICD-10-CM | POA: Insufficient documentation

## 2015-04-04 DIAGNOSIS — I10 Essential (primary) hypertension: Secondary | ICD-10-CM | POA: Insufficient documentation

## 2015-04-04 DIAGNOSIS — Z7982 Long term (current) use of aspirin: Secondary | ICD-10-CM | POA: Diagnosis not present

## 2015-04-04 DIAGNOSIS — R109 Unspecified abdominal pain: Secondary | ICD-10-CM | POA: Diagnosis present

## 2015-04-04 DIAGNOSIS — R1032 Left lower quadrant pain: Secondary | ICD-10-CM | POA: Insufficient documentation

## 2015-04-04 LAB — LIPASE, BLOOD: Lipase: 31 U/L (ref 22–51)

## 2015-04-04 LAB — COMPREHENSIVE METABOLIC PANEL
ALT: 71 U/L — ABNORMAL HIGH (ref 17–63)
ANION GAP: 6 (ref 5–15)
AST: 65 U/L — ABNORMAL HIGH (ref 15–41)
Albumin: 4.1 g/dL (ref 3.5–5.0)
Alkaline Phosphatase: 97 U/L (ref 38–126)
BILIRUBIN TOTAL: 0.9 mg/dL (ref 0.3–1.2)
BUN: 12 mg/dL (ref 6–20)
CO2: 26 mmol/L (ref 22–32)
Calcium: 9.4 mg/dL (ref 8.9–10.3)
Chloride: 104 mmol/L (ref 101–111)
Creatinine, Ser: 0.98 mg/dL (ref 0.61–1.24)
GFR calc Af Amer: >60 mL/min (ref >60)
Glucose, Bld: 107 mg/dL — ABNORMAL HIGH (ref 65–99)
POTASSIUM: 4.1 mmol/L (ref 3.5–5.1)
Sodium: 136 mmol/L (ref 135–145)
TOTAL PROTEIN: 7 g/dL (ref 6.5–8.1)

## 2015-04-04 LAB — CBC
HCT: 45.3 % (ref 39.0–52.0)
Hemoglobin: 15.1 g/dL (ref 13.0–17.0)
MCH: 30.5 pg (ref 26.0–34.0)
MCHC: 33.3 g/dL (ref 30.0–36.0)
MCV: 91.5 fL (ref 78.0–100.0)
PLATELETS: 211 10*3/uL (ref 150–400)
RBC: 4.95 MIL/uL (ref 4.22–5.81)
RDW: 12.5 % (ref 11.5–15.5)
WBC: 7.8 10*3/uL (ref 4.0–10.5)

## 2015-04-04 LAB — URINALYSIS, ROUTINE W REFLEX MICROSCOPIC
BILIRUBIN URINE: NEGATIVE
Glucose, UA: NEGATIVE mg/dL
Hgb urine dipstick: NEGATIVE
KETONES UR: NEGATIVE mg/dL
LEUKOCYTES UA: NEGATIVE
NITRITE: NEGATIVE
PROTEIN: NEGATIVE mg/dL
Specific Gravity, Urine: 1.003 — ABNORMAL LOW (ref 1.005–1.030)
Urobilinogen, UA: 0.2 mg/dL (ref 0.0–1.0)
pH: 6.5 (ref 5.0–8.0)

## 2015-04-04 MED ORDER — DICYCLOMINE HCL 20 MG PO TABS: 10.0000 mg | ORAL_TABLET | Freq: Two times a day (BID) | ORAL | Status: DC | PRN

## 2015-04-04 MED ORDER — IOHEXOL 300 MG/ML  SOLN
100.0000 mL | Freq: Once | INTRAMUSCULAR | Status: AC | PRN
  Administered 2015-04-04: 100 mL via INTRAVENOUS

## 2015-04-04 NOTE — ED Provider Notes (Signed)
Pt visit shared with Dr. Roderic Palau. CT scan without any acute abnormalities. Discussed continuing antibiotics to complete course, PCP follow-up. Prescribing Bentyl for abdominal cramping.  Quintella Reichert, MD 04/04/15 [phone]

## 2015-04-04 NOTE — ED Provider Notes (Signed)
CSN: [id number]     Arrival date & time 04/04/15  1100 History   First MD Initiated Contact with Patient 04/04/15 1307     Chief Complaint  Patient presents with  . Abdominal Pain     (Consider location/radiation/quality/duration/timing/severity/associated sxs/prior Treatment) Patient is a 69 y.o. male presenting with abdominal pain. The history is provided by the patient (The patient complains of lower abdominal pain. He had similar pain a few months ago and was treated for colitis his family physician started him on Cipro and Flagyl recently. Patient has an admission in the spring for colitis. Ischemic or infectious .).  Abdominal Pain Pain location:  LLQ and RLQ Pain quality: aching   Pain radiates to:  Does not radiate Pain severity:  Moderate Onset quality:  Sudden Timing:  Constant Progression:  Waxing and waning Associated symptoms: no chest pain, no cough, no diarrhea, no fatigue and no hematuria     Past Medical History  Diagnosis Date  . Hearing loss   . Hypertension   . Coronary artery disease   . Hyperlipidemia   . GERD (gastroesophageal reflux disease)   . IBS (irritable bowel syndrome)   . Gallbladder disease   . Increased prostate specific antigen (PSA) velocity   . DJD (degenerative joint disease)   . Colitis    Past Surgical History  Procedure Laterality Date  . Tonsillectomy and adenoidectomy      in the 2nd grade  . Coronary stent placement      Dr. [name]  . Colonoscopy    . Cardiac catheterization    . Coronary stent placement  01/07/2015    LAD  . Cardiac catheterization N/A 01/07/2015    Procedure: Left Heart Cath and Coronary Angiography;  Surgeon: [name], MD;  Location: [place] [facility];  Service: Cardiovascular;  Laterality: N/A;  . Cardiac catheterization N/A 01/07/2015    Procedure: Coronary Stent Intervention;  Surgeon: [name], MD;  Location: [place] [facility];  Service: Cardiovascular;  Laterality: N/A;   Family  History  Problem Relation Age of Onset  . Stroke Mother    Social History  Substance Use Topics  . Smoking status: Never Smoker   . Smokeless tobacco: Never Used  . Alcohol Use: No     Comment: rare use    Review of Systems  Constitutional: Negative for appetite change and fatigue.  HENT: Negative for congestion, ear discharge and sinus pressure.   Eyes: Negative for discharge.  Respiratory: Negative for cough.   Cardiovascular: Negative for chest pain.  Gastrointestinal: Positive for abdominal pain. Negative for diarrhea.  Genitourinary: Negative for frequency and hematuria.  Musculoskeletal: Negative for back pain.  Skin: Negative for rash.  Neurological: Negative for seizures and headaches.  Psychiatric/Behavioral: Negative for hallucinations.      Allergies  Review of patient's allergies indicates no known allergies.  Home Medications   Prior to Admission medications   Medication Sig Start Date End Date Taking? Authorizing Provider  aspirin EC 81 MG tablet Take 1 tablet (81 mg total) by mouth daily. 01/01/15   Lucille Passy, MD  atorvastatin (LIPITOR) 80 MG tablet Take 0.5 tablets (40 mg total) by mouth daily. 01/12/15   Rhonda G Barrett, PA-C  ciprofloxacin (CIPRO) 500 MG tablet Take 1 tablet (500 mg total) by mouth 2 (two) times daily. 03/31/15   Abner Greenspan, MD  clopidogrel (PLAVIX) 75 MG tablet Take 1 tablet (75 mg total) by mouth daily. 01/02/15 01/02/16  Rogue Jury  Ferne Reus, MD  metoprolol (LOPRESSOR) 50 MG tablet TAKE 1/2 TABLET BY MOUTH 2 TIMES DAILY 10/31/14   Abner Greenspan, MD  metroNIDAZOLE (FLAGYL) 500 MG tablet Take 1 tablet (500 mg total) by mouth 3 (three) times daily. 03/31/15   Abner Greenspan, MD  Multiple Vitamins-Minerals (MULTIVITAMIN & MINERAL PO) Take 1 tablet by mouth daily.    Historical Provider, MD  nitroGLYCERIN (NITROSTAT) 0.4 MG SL tablet Place 1 tablet (0.4 mg total) under the tongue every 5 (five) minutes as needed for chest pain. 01/08/15   Leanor Kail, PA  Omega-3 Krill Oil 500 MG CAPS Take 1 capsule by mouth daily.    Historical Provider, MD  pantoprazole (PROTONIX) 40 MG tablet Take 1 tablet (40 mg total) by mouth 2 (two) times daily before a meal. 01/12/15   Rhonda G Barrett, PA-C  ramipril (ALTACE) 10 MG capsule Take 1 capsule (10 mg total) by mouth daily. 10/31/14   Abner Greenspan, MD  scopolamine (TRANSDERM-SCOP) 1 MG/3DAYS Place 1 patch (1.5 mg total) onto the skin every 3 (three) days. 03/31/15   Abner Greenspan, MD   BP 114/67 mmHg  Pulse 55  Temp(Src) 98.1 F (36.7 C) (Oral)  Resp 14  Ht 5\' 8"  (1.727 m)  Wt 181 lb 8 oz (82.328 kg)  BMI 27.60 kg/m2  SpO2 98% Physical Exam  Constitutional: He is oriented to person, place, and time. He appears well-developed.  HENT:  Head: Normocephalic.  Eyes: Conjunctivae and EOM are normal. No scleral icterus.  Neck: Neck supple. No thyromegaly present.  Cardiovascular: Normal rate and regular rhythm.  Exam reveals no gallop and no friction rub.   No murmur heard. Pulmonary/Chest: No stridor. He has no wheezes. He has no rales. He exhibits no tenderness.  Abdominal: He exhibits no distension. There is tenderness. There is no rebound.  Tender llq and rlq mild  Musculoskeletal: Normal range of motion. He exhibits no edema.  Lymphadenopathy:    He has no cervical adenopathy.  Neurological: He is oriented to person, place, and time. He exhibits normal muscle tone. Coordination normal.  Skin: No rash noted. No erythema.  Psychiatric: He has a normal mood and affect. His behavior is normal.    ED Course  Procedures (including critical care time) Labs Review Labs Reviewed  COMPREHENSIVE METABOLIC PANEL - Abnormal; Notable for the following:    Glucose, Bld 107 (*)    AST 65 (*)    ALT 71 (*)    All other components within normal limits  URINALYSIS, ROUTINE W REFLEX MICROSCOPIC (NOT AT [facility]) - Abnormal; Notable for the following:    Specific Gravity, Urine 1.003 (*)    All other  components within normal limits  LIPASE, BLOOD  CBC    Imaging Review No results found. I have personally reviewed and evaluated these images and lab results as part of my medical decision-making.   EKG Interpretation None      MDM   Final diagnoses:  None    Abdominal pain.  Labs unremarkable patient to get CT scan. He is being treated for colitis with Cipro and Flagyl. Possible ischemic colitis a few months ago. Normal colonoscopy one month after admission for colitis.    Milton Ferguson, MD 04/05/15 [phone]

## 2015-04-04 NOTE — ED Notes (Signed)
Pt reports lower abd pain that feels like cramping with lose stools which has gotten worse over the last 2 weeks. Pt went to PCP on Tuesday and was prescribed flagyl and cipro. Pt reports it got worse so he went back to his pcp yesterday and was told to continue the treatment and follow up Monday. Pt also reports that he is worried that this may be from mold exposure. Pt alert x4. NAD at this time.

## 2015-04-04 NOTE — ED Notes (Signed)
Pt just arrived in room

## 2015-04-04 NOTE — Discharge Instructions (Signed)

## 2015-04-05 NOTE — Assessment & Plan Note (Signed)
See assessment for abd pain - recent vague low abd discomfort is no better or worse with 3d of flagyl and cipro  Lab today  Disc poss of ischemic colitis - but symptoms are mild  Consider f/u GI

## 2015-04-06 ENCOUNTER — Telehealth: Payer: Self-pay | Admitting: Family Medicine

## 2015-04-06 ENCOUNTER — Telehealth (HOSPITAL_COMMUNITY): Payer: Self-pay | Admitting: Family Medicine

## 2015-04-06 ENCOUNTER — Telehealth: Payer: Self-pay | Admitting: *Deleted

## 2015-04-06 ENCOUNTER — Encounter (HOSPITAL_COMMUNITY): Payer: Medicare HMO

## 2015-04-06 DIAGNOSIS — R1032 Left lower quadrant pain: Principal | ICD-10-CM

## 2015-04-06 DIAGNOSIS — R1031 Right lower quadrant pain: Secondary | ICD-10-CM

## 2015-04-06 NOTE — Telephone Encounter (Signed)
Pt is calling with an update  Pt is taking flagyl - he felt worse and nasueous.He had no appetite. He stopped taking it on Sat night.   His abdominal pain is better than it was last week.   cb number [phone]  Pt went to Ed on Saturday night ([facility]) - Dr Glori Bickers is aware of this  They agree that pt should go to GI.  Brian Todd  Pt has cancelled his vacation for next week so he is available.

## 2015-04-06 NOTE — Telephone Encounter (Signed)
Appt made with Dr Deatra Ina on 04/08/15 at 10:50am, patient is aware.

## 2015-04-06 NOTE — Telephone Encounter (Signed)
Dr. Glori Bickers sent me a message saying "Please let pt know I rev ED notes. Hope he is starting to feel better. I want to get him set up with a GI f/u appt- please ask him if that is ok (unless he already has one)"   Called pt and advise pt of Dr. Marliss Coots comments and he said that our [facility] did an Urgent referral so he has a GI appt set up this Wed. 04/08/15

## 2015-04-06 NOTE — Telephone Encounter (Signed)
I will do ref and route to [facility]

## 2015-04-08 ENCOUNTER — Encounter (HOSPITAL_COMMUNITY): Payer: Medicare HMO

## 2015-04-08 ENCOUNTER — Ambulatory Visit (INDEPENDENT_AMBULATORY_CARE_PROVIDER_SITE_OTHER): Payer: Medicare HMO | Admitting: Gastroenterology

## 2015-04-08 ENCOUNTER — Telehealth (HOSPITAL_COMMUNITY): Payer: Self-pay | Admitting: *Deleted

## 2015-04-08 ENCOUNTER — Encounter: Payer: Self-pay | Admitting: Gastroenterology

## 2015-04-08 VITALS — BP 114/54 | HR 56 | Ht 67.5 in | Wt 177.5 lb

## 2015-04-08 DIAGNOSIS — R1032 Left lower quadrant pain: Secondary | ICD-10-CM | POA: Diagnosis not present

## 2015-04-08 DIAGNOSIS — R1031 Right lower quadrant pain: Secondary | ICD-10-CM

## 2015-04-08 NOTE — Patient Instructions (Signed)
Follow up as needed

## 2015-04-08 NOTE — Assessment & Plan Note (Signed)
Patient developed nonspecific lower abdominal pain that was treated empirically with Cipro and Flagyl.  When evaluated several days later CT scan was unremarkable.  There is no clinical evidence for ischemic colitis or diverticulitis.  I believe he developed some side effects from Flagyl.  Recommendations #1 no further GI workup.  Patient was instructed to take dicyclomine as needed.

## 2015-04-08 NOTE — Progress Notes (Signed)
      History of Present Illness:  Brian Todd has returned for evaluation of lower abdominal pain.  Approximate one week ago he developed crampy lower abdominal pain with loose stools.  He was placed on Cipro and Flagyl by his PCP.  About 3 or 4 days later he developed nausea, frank diarrhea and continued with crampy abdominal pain.  This prompted an ED visit where lab work and CT scan were unremarkable.  He also complained of numbness in his toes, anorexia and mild nausea.  These latter symptoms subsided after he stopped Flagyl.  Stools are becoming normal again and crampy pain is minimal.  Colonoscopy in May, 2016 demonstrate a mild diverticulosis    Review of Systems: Pertinent positive and negative review of systems were noted in the above HPI section. All other review of systems were otherwise negative.    Current Medications, Allergies, Past Medical History, Past Surgical History, Family History and Social History were reviewed in [place] record  Vital signs were reviewed in today's medical record. Physical Exam: General: Well developed , well nourished, no acute distress Skin: anicteric Head: Normocephalic and atraumatic Eyes:  sclerae anicteric, EOMI Ears: Normal auditory acuity Mouth: No deformity or lesions Lymph Nodes: no lymphadenopathy Lungs: Clear throughout to auscultation Heart: Regular rate and rhythm; no murmurs, rubs or brui: Gastroinestinal:  Soft, non tender and non distended. No masses, hepatosplenomegaly or hernias noted. Normal Bowel sounds Rectal:deferred Musculoskeletal: Symmetrical with no gross deformities  Pulses:  Normal pulses noted Extremities: No clubbing, cyanosis, edema or deformities noted Neurological: Alert oriented x 4, grossly nonfocal Psychological:  Alert and cooperative. Normal mood and affect  See Assessment and Plan under Problem List

## 2015-04-09 ENCOUNTER — Telehealth: Payer: Self-pay

## 2015-04-09 ENCOUNTER — Encounter: Payer: Self-pay | Admitting: Cardiovascular Disease

## 2015-04-09 ENCOUNTER — Ambulatory Visit (INDEPENDENT_AMBULATORY_CARE_PROVIDER_SITE_OTHER): Payer: Medicare HMO | Admitting: Cardiovascular Disease

## 2015-04-09 VITALS — BP 120/68 | HR 50 | Wt 180.5 lb

## 2015-04-09 DIAGNOSIS — I2510 Atherosclerotic heart disease of native coronary artery without angina pectoris: Secondary | ICD-10-CM | POA: Diagnosis not present

## 2015-04-09 DIAGNOSIS — E785 Hyperlipidemia, unspecified: Secondary | ICD-10-CM | POA: Diagnosis not present

## 2015-04-09 DIAGNOSIS — I10 Essential (primary) hypertension: Secondary | ICD-10-CM

## 2015-04-09 MED ORDER — CLOPIDOGREL BISULFATE 75 MG PO TABS: 75.0000 mg | ORAL_TABLET | Freq: Every day | ORAL | Status: DC

## 2015-04-09 MED ORDER — PANTOPRAZOLE SODIUM 40 MG PO TBEC: 40.0000 mg | DELAYED_RELEASE_TABLET | Freq: Two times a day (BID) | ORAL | Status: DC

## 2015-04-09 NOTE — Assessment & Plan Note (Signed)
Blood pressure is well controlled on current medications. 

## 2015-04-09 NOTE — Telephone Encounter (Signed)
Pt left v/m; pt is going to leave paperwork at front desk this afternoon for pt to get reimbursement for vacation that had to be cancelled due to pt not being able to travel; pt had travel insurance but will need form filled out; pt request cb when completed. Pt spoke with Dr Ralene Bathe at ED and was advised do not fill out that type paperwork. FYI to Dr Glori Bickers.

## 2015-04-09 NOTE — Patient Instructions (Signed)
Medication Instructions: Continue same medications.   Labwork: None.   Procedures/Testing: None.   Follow-Up: 6 months with Dr. Lacey Dotson.   Any Additional Special Instructions Will Be Listed Below (If Applicable).   

## 2015-04-09 NOTE — Progress Notes (Signed)
Primary care physician: Dr. Glori Bickers  HPI  This is a pleasant 69 year old man who is here today for a follow-up visit regarding coronary artery disease. He has known history of coronary artery disease. He had a small non-ST elevation myocardial infarction in 2001. Cardiac catheterization showed a 95% mid LAD stenosis and 40% stenosis in OM branch. He had successful angioplasty and Taxus drug-eluting stent placement to the mid LAD without complications. He has other chronic medical conditions that include hypertension, hyperlipidemia and gastroesophageal reflux disease. He was seen in June of this year with symptoms suggestive of class III angina in spite of medical therapy. Thus, I proceeded with cardiac catheterization which showed 99% in-stent restenosis in the mid LAD extending to the distal edge, 60% mid left circumflex stenosis and mild RCA disease. I performed successful angioplasty and drug-eluting stent placement to the mid LAD. He had no anginal symptoms since then but he did have recurrent issues with abdominal discomfort of unclear etiology. He underwent a nuclear stress test which showed no evidence of ischemia with normal ejection fraction. He continues to attend cardiac rehabilitation. He was seen by GI recently and felt that recent symptoms were likely due to Flagyl. He reports overall improvement.   No Known Allergies   Current Outpatient Prescriptions on File Prior to Visit  Medication Sig Dispense Refill  . aspirin EC 81 MG tablet Take 1 tablet (81 mg total) by mouth daily.    Marland Kitchen atorvastatin (LIPITOR) 80 MG tablet Take 0.5 tablets (40 mg total) by mouth daily. 30 tablet 6  . ciprofloxacin (CIPRO) 500 MG tablet Take 1 tablet (500 mg total) by mouth 2 (two) times daily. 20 tablet 0  . dicyclomine (BENTYL) 20 MG tablet Take 0.5 tablets (10 mg total) by mouth 2 (two) times daily as needed for spasms. 14 tablet 0  . metoprolol (LOPRESSOR) 50 MG tablet TAKE 1/2 TABLET BY MOUTH 2 TIMES  DAILY 90 tablet 3  . Multiple Vitamins-Minerals (MULTIVITAMIN & MINERAL PO) Take 1 tablet by mouth daily.    . nitroGLYCERIN (NITROSTAT) 0.4 MG SL tablet Place 1 tablet (0.4 mg total) under the tongue every 5 (five) minutes as needed for chest pain. 25 tablet 3  . Omega-3 Krill Oil 500 MG CAPS Take 1 capsule by mouth daily.    . ramipril (ALTACE) 10 MG capsule Take 1 capsule (10 mg total) by mouth daily. 90 capsule 3   No current facility-administered medications on file prior to visit.     Past Medical History  Diagnosis Date  . Hearing loss   . Hypertension   . Coronary artery disease   . Hyperlipidemia   . GERD (gastroesophageal reflux disease)   . IBS (irritable bowel syndrome)   . Gallbladder disease   . Increased prostate specific antigen (PSA) velocity   . DJD (degenerative joint disease)   . Colitis      Past Surgical History  Procedure Laterality Date  . Tonsillectomy and adenoidectomy      in the 2nd grade  . Coronary stent placement      Dr. [name]  . Colonoscopy    . Cardiac catheterization    . Coronary stent placement  01/07/2015    LAD  . Cardiac catheterization N/A 01/07/2015    Procedure: Left Heart Cath and Coronary Angiography;  Surgeon: [name], MD;  Location: [facility];  Service: Cardiovascular;  Laterality: N/A;  . Cardiac catheterization N/A 01/07/2015    Procedure: Coronary Stent Intervention;  Surgeon:  [name], MD;  Location: [facility];  Service: Cardiovascular;  Laterality: N/A;     Family History  Problem Relation Age of Onset  . Stroke Mother      Social History   Social History  . Marital Status: Married    Spouse Name: linda wrenn Zakrzewski  . Number of Children: 2  . Years of Education: N/A   Occupational History  . engineering     Retired   Social History Main Topics  . Smoking status: Never Smoker   . Smokeless tobacco: Never Used  . Alcohol Use: No     Comment: rare use  . Drug Use: No  .  Sexual Activity: Yes   Other Topics Concern  . Not on file   Social History Narrative     ROS A 10 point review of system was performed. It is negative other than that mentioned in the history of present illness.   PHYSICAL EXAM   BP 120/68 mmHg  Pulse 50  Wt 180 lb 8 oz (81.874 kg) Constitutional: He is oriented to person, place, and time. He appears well-developed and well-nourished. No distress.  HENT: No nasal discharge.  Head: Normocephalic and atraumatic.  Eyes: Pupils are equal and round.  No discharge. Neck: Normal range of motion. Neck supple. No JVD present. No thyromegaly present.  Cardiovascular: Normal rate, regular rhythm, normal heart sounds. Exam reveals no gallop and no friction rub. No murmur heard.  Pulmonary/Chest: Effort normal and breath sounds normal. No stridor. No respiratory distress. He has no wheezes. He has no rales. He exhibits no tenderness.  Abdominal: Soft. Bowel sounds are normal. He exhibits no distension. There is no tenderness. There is no rebound and no guarding.  Musculoskeletal: Normal range of motion. He exhibits no edema and no tenderness.  Neurological: He is alert and oriented to person, place, and time. Coordination normal.  Skin: Skin is warm and dry. No rash noted. He is not diaphoretic. No erythema. No pallor.  Psychiatric: He has a normal mood and affect. His behavior is normal. Judgment and thought content normal.      ASSESSMENT AND PLAN

## 2015-04-09 NOTE — Telephone Encounter (Signed)
Pt dropped of forms for travel insurance In dr tower's IN BOX For review and signature

## 2015-04-09 NOTE — Assessment & Plan Note (Signed)
Lab Results  Component Value Date   CHOL 147 10/24/2014   HDL 36.80* 10/24/2014   LDLCALC 76 10/24/2014        TRIG 171.0* 10/24/2014   CHOLHDL 4 10/24/2014   Continue treatment with high dose atorvastatin.

## 2015-04-09 NOTE — Assessment & Plan Note (Signed)
He is doing well overall from a cardiac standpoint with no convincing symptoms of angina. Continue aggressive medical therapy.

## 2015-04-10 ENCOUNTER — Encounter (HOSPITAL_COMMUNITY): Payer: Medicare HMO

## 2015-04-10 NOTE — Telephone Encounter (Signed)
In IN box  Hope I did everything correctly

## 2015-04-13 ENCOUNTER — Encounter (HOSPITAL_COMMUNITY): Admission: RE | Admit: 2015-04-13 | Payer: Medicare HMO | Source: Ambulatory Visit

## 2015-04-13 NOTE — Telephone Encounter (Signed)
Left message letting patient know forms are ready for pick up  Copy to patient Copy to scan Copy to file

## 2015-04-13 NOTE — Telephone Encounter (Signed)
Forms given to Robin 

## 2015-04-15 ENCOUNTER — Telehealth: Payer: Self-pay

## 2015-04-15 ENCOUNTER — Encounter (HOSPITAL_COMMUNITY)
Admission: RE | Admit: 2015-04-15 | Discharge: 2015-04-15 | Disposition: A | Discharge status: Home / Self Care | Payer: Medicare HMO | Source: Ambulatory Visit | Attending: Cardiovascular Disease | Admitting: Cardiovascular Disease

## 2015-04-15 DIAGNOSIS — Z48812 Encounter for surgical aftercare following surgery on the circulatory system: Secondary | ICD-10-CM | POA: Diagnosis not present

## 2015-04-15 MED ORDER — NYSTATIN 100000 UNIT/ML MT SUSP: OROMUCOSAL | Status: DC

## 2015-04-15 NOTE — Telephone Encounter (Signed)
Pt was seen at cardiac rehab earlier today and pt was advised he has thrush and pt request med sent to [facility]. Pt request cb.

## 2015-04-15 NOTE — Telephone Encounter (Signed)
I sent nystatin mouthwash- follow directions Update if no improvement in a week

## 2015-04-15 NOTE — Telephone Encounter (Signed)
Pt notified Rx sent to pharmacy and to update [place] if no improvement in a week

## 2015-04-15 NOTE — Progress Notes (Signed)
Brian Todd 69 y.o. male  Nutrition Note Spoke with pt. Pt c/o continued GI distress (loose stools, lower abdominal discomfort). Pt has been on 2 antibiotics. Pt encouraged to incorporate probiotics in his diet (e.g. Yogurt or Kefir with live and active cultures and Align Probiotic), which may help with pt's GI distress. Pt c/o "thick, white stuff" on his tongue. Pt encouraged to talk with his PCP re: ? Thrush/ need for medication to treat. Pt expressed understanding of the information reviewed. Continue client-centered nutrition education by RD as part of interdisciplinary care.  Monitor and evaluate progress toward nutrition goal with team.  Derek Mound, M.Ed, RD, LDN, CDE 04/15/2015 2:05 PM

## 2015-04-17 ENCOUNTER — Encounter (HOSPITAL_COMMUNITY): Payer: Medicare HMO

## 2015-04-20 ENCOUNTER — Encounter (HOSPITAL_COMMUNITY)
Admission: RE | Admit: 2015-04-20 | Discharge: 2015-04-20 | Disposition: A | Discharge status: Home / Self Care | Payer: Medicare HMO | Source: Ambulatory Visit | Attending: Cardiovascular Disease | Admitting: Cardiovascular Disease

## 2015-04-20 DIAGNOSIS — Z48812 Encounter for surgical aftercare following surgery on the circulatory system: Secondary | ICD-10-CM | POA: Diagnosis not present

## 2015-04-22 ENCOUNTER — Encounter (HOSPITAL_COMMUNITY)
Admission: RE | Admit: 2015-04-22 | Discharge: 2015-04-22 | Disposition: A | Discharge status: Home / Self Care | Payer: Medicare HMO | Source: Ambulatory Visit | Attending: Cardiovascular Disease | Admitting: Cardiovascular Disease

## 2015-04-22 DIAGNOSIS — Z48812 Encounter for surgical aftercare following surgery on the circulatory system: Secondary | ICD-10-CM | POA: Diagnosis not present

## 2015-04-23 ENCOUNTER — Other Ambulatory Visit: Payer: Self-pay | Admitting: Family Medicine

## 2015-04-23 NOTE — Telephone Encounter (Signed)
Please refill times one  

## 2015-04-23 NOTE — Telephone Encounter (Signed)
Electronic refill request, please advise  

## 2015-04-24 ENCOUNTER — Encounter (HOSPITAL_COMMUNITY)
Admission: RE | Admit: 2015-04-24 | Discharge: 2015-04-24 | Disposition: A | Discharge status: Home / Self Care | Payer: Medicare HMO | Source: Ambulatory Visit | Attending: Cardiovascular Disease | Admitting: Cardiovascular Disease

## 2015-04-24 DIAGNOSIS — Z48812 Encounter for surgical aftercare following surgery on the circulatory system: Secondary | ICD-10-CM | POA: Diagnosis not present

## 2015-04-24 NOTE — Telephone Encounter (Signed)
done

## 2015-04-27 ENCOUNTER — Encounter (HOSPITAL_COMMUNITY)
Admission: RE | Admit: 2015-04-27 | Discharge: 2015-04-27 | Disposition: A | Discharge status: Home / Self Care | Payer: Medicare HMO | Source: Ambulatory Visit | Attending: Cardiovascular Disease | Admitting: Cardiovascular Disease

## 2015-04-27 DIAGNOSIS — Z48812 Encounter for surgical aftercare following surgery on the circulatory system: Secondary | ICD-10-CM | POA: Diagnosis not present

## 2015-04-27 DIAGNOSIS — Z955 Presence of coronary angioplasty implant and graft: Secondary | ICD-10-CM | POA: Diagnosis not present

## 2015-04-29 ENCOUNTER — Encounter (HOSPITAL_COMMUNITY)
Admission: RE | Admit: 2015-04-29 | Discharge: 2015-04-29 | Disposition: A | Discharge status: Home / Self Care | Payer: Medicare HMO | Source: Ambulatory Visit | Attending: Cardiovascular Disease | Admitting: Cardiovascular Disease

## 2015-04-29 DIAGNOSIS — Z48812 Encounter for surgical aftercare following surgery on the circulatory system: Secondary | ICD-10-CM | POA: Diagnosis not present

## 2015-04-29 DIAGNOSIS — Z955 Presence of coronary angioplasty implant and graft: Secondary | ICD-10-CM | POA: Diagnosis not present

## 2015-05-01 ENCOUNTER — Encounter (HOSPITAL_COMMUNITY)
Admission: RE | Admit: 2015-05-01 | Discharge: 2015-05-01 | Disposition: A | Discharge status: Home / Self Care | Payer: Medicare HMO | Source: Ambulatory Visit | Attending: Cardiovascular Disease | Admitting: Cardiovascular Disease

## 2015-05-01 DIAGNOSIS — Z955 Presence of coronary angioplasty implant and graft: Secondary | ICD-10-CM | POA: Diagnosis not present

## 2015-05-01 DIAGNOSIS — Z48812 Encounter for surgical aftercare following surgery on the circulatory system: Secondary | ICD-10-CM | POA: Diagnosis not present

## 2015-05-04 ENCOUNTER — Other Ambulatory Visit: Payer: Medicare HMO

## 2015-05-04 ENCOUNTER — Encounter: Payer: Self-pay | Admitting: Family Medicine

## 2015-05-04 ENCOUNTER — Ambulatory Visit (INDEPENDENT_AMBULATORY_CARE_PROVIDER_SITE_OTHER): Payer: Medicare HMO | Admitting: Family Medicine

## 2015-05-04 ENCOUNTER — Encounter (HOSPITAL_COMMUNITY): Payer: Medicare HMO

## 2015-05-04 VITALS — BP 122/70 | HR 56 | Temp 98.4°F | Ht 67.5 in | Wt 181.8 lb

## 2015-05-04 DIAGNOSIS — R972 Elevated prostate specific antigen [PSA]: Secondary | ICD-10-CM

## 2015-05-04 DIAGNOSIS — B370 Candidal stomatitis: Secondary | ICD-10-CM | POA: Diagnosis not present

## 2015-05-04 DIAGNOSIS — Z23 Encounter for immunization: Secondary | ICD-10-CM | POA: Diagnosis not present

## 2015-05-04 LAB — PSA: PSA: 1.63 ng/mL (ref 0.10–4.00)

## 2015-05-04 MED ORDER — FLUCONAZOLE 200 MG PO TABS: 200.0000 mg | ORAL_TABLET | Freq: Every day | ORAL | Status: DC

## 2015-05-04 NOTE — Progress Notes (Signed)
Pre visit review using our clinic review tool, if applicable. No additional management support is needed unless otherwise documented below in the visit note. 

## 2015-05-04 NOTE — Patient Instructions (Addendum)
Take the fluconazole 200 mg daily for 7 days  If any problems please let me know  When on this -hold your lipitor (the days you take the fluconazole)   Labs to re check psa today  Update me in a week if not better (call)   I hope this helps

## 2015-05-04 NOTE — Progress Notes (Signed)
Subjective:    Patient ID: Brian Todd, male    DOB: 11/24/1945, 69 y.o.   MRN: [id number]  HPI Here for thrush and also for a 6 mo re check psa for elevated psa     Lab Results  Component Value Date   PSA 2.94 10/24/2014   PSA 1.78 09/16/2013   PSA 1.71 09/12/2012    Has been on antibiotics several times since April   Has had mouth symptoms since then also  Mouth feels uncomfortable and dry and has an unusual taste in his mouth  Throat feels like he needs to clear it all the time   Cardiac rehab called to tell us he had thrush   He has eaten yogurt  Also oral probiotics - otc (? Align) Has been taking one per day   Has gone through 2 bottles of mouthwash  Switched out his toothbrush also -brushes tongue  Improved for a week -now not getting any better      Of note had side eff from flagyl last time Ct was neg  Saw GI - Dr Deatra Ina  Told to stop the cipro also   Did not have a good reason for any of his GI symptoms  Given dicyclomine for spasm -and he did not take it  A little epigastric pain on and off     Patient Active Problem List   Diagnosis Date Noted  . Coronary artery disease involving native coronary artery without angina pectoris 04/09/2015  . Abdominal pain, left lower quadrant 04/08/2015  . Bilateral lower abdominal pain 03/31/2015  . Tick bite 01/28/2015  . Effort angina ([place]) 01/07/2015  . Chest pain 01/01/2015  . History of heart artery stent 01/01/2015  . Headache 01/01/2015  . Rectal bleeding 11/08/2014  . Colitis 11/08/2014  . Encounter for Medicare annual wellness exam 10/31/2014  . UTI (urinary tract infection) 04/28/2014  . Contact dermatitis 01/28/2013  . Tinea pedis 01/28/2013  . Physical exam, annual 09/14/2012  . DEGENERATIVE JOINT DISEASE 07/30/2009  . PSA, INCREASED 07/30/2009  . HEARING LOSS, MILD 11/27/2007  . IRRITABLE BOWEL SYNDROME 11/27/2007  . GALLBLADDER DISEASE 11/27/2007  . Hyperlipidemia 08/13/2007  . Essential  hypertension 08/13/2007  . GERD 08/13/2007   Past Medical History  Diagnosis Date  . Hearing loss   . Hypertension   . Coronary artery disease   . Hyperlipidemia   . GERD (gastroesophageal reflux disease)   . IBS (irritable bowel syndrome)   . Gallbladder disease   . Increased prostate specific antigen (PSA) velocity   . DJD (degenerative joint disease)   . Colitis    Past Surgical History  Procedure Laterality Date  . Tonsillectomy and adenoidectomy      in the 2nd grade  . Coronary stent placement      Dr. [name]  . Colonoscopy    . Cardiac catheterization    . Coronary stent placement  01/07/2015    LAD  . Cardiac catheterization N/A 01/07/2015    Procedure: Left Heart Cath and Coronary Angiography;  Surgeon: [name], MD;  Location: [facility];  Service: Cardiovascular;  Laterality: N/A;  . Cardiac catheterization N/A 01/07/2015    Procedure: Coronary Stent Intervention;  Surgeon: [name], MD;  Location: [facility];  Service: Cardiovascular;  Laterality: N/A;   Social History  Substance Use Topics  . Smoking status: Never Smoker   . Smokeless tobacco: Never Used  . Alcohol Use: No  Comment: rare use   Family History  Problem Relation Age of Onset  . Stroke Mother    Allergies  Allergen Reactions  . Flagyl [Metronidazole]    Current Outpatient Prescriptions on File Prior to Visit  Medication Sig Dispense Refill  . aspirin EC 81 MG tablet Take 1 tablet (81 mg total) by mouth daily.    Marland Kitchen atorvastatin (LIPITOR) 80 MG tablet Take 0.5 tablets (40 mg total) by mouth daily. 30 tablet 6  . clopidogrel (PLAVIX) 75 MG tablet Take 1 tablet (75 mg total) by mouth daily. 90 tablet 3  . dicyclomine (BENTYL) 20 MG tablet Take 0.5 tablets (10 mg total) by mouth 2 (two) times daily as needed for spasms. 14 tablet 0  . metoprolol (LOPRESSOR) 50 MG tablet TAKE 1/2 TABLET BY MOUTH 2 TIMES DAILY 90 tablet 3  . Multiple Vitamins-Minerals  (MULTIVITAMIN & MINERAL PO) Take 1 tablet by mouth daily.    . nitroGLYCERIN (NITROSTAT) 0.4 MG SL tablet Place 1 tablet (0.4 mg total) under the tongue every 5 (five) minutes as needed for chest pain. 25 tablet 3  . nystatin (MYCOSTATIN) 100000 UNIT/ML suspension SWISH 5ML FOR 30 SECONDS AND SWALLOW THREE TIMES DAILY 120 mL 0  . Omega-3 Krill Oil 500 MG CAPS Take 1 capsule by mouth daily.    . pantoprazole (PROTONIX) 40 MG tablet Take 1 tablet (40 mg total) by mouth 2 (two) times daily before a meal. 180 tablet 3  . ramipril (ALTACE) 10 MG capsule Take 1 capsule (10 mg total) by mouth daily. 90 capsule 3   No current facility-administered medications on file prior to visit.    Review of Systems Review of Systems  Constitutional: Negative for fever, appetite change, fatigue and unexpected weight change.  Eyes: Negative for pain and visual disturbance.  ENT pos for bad taste on tongue with discomfort  Respiratory: Negative for cough and shortness of breath.   Cardiovascular: Negative for cp or palpitations    Gastrointestinal: Negative for nausea, diarrhea and constipation. pos for occ epigastric pain  Genitourinary: Negative for urgency and frequency.  Skin: Negative for pallor or rash   Neurological: Negative for weakness, light-headedness, numbness and headaches.  Hematological: Negative for adenopathy. Does not bruise/bleed easily.  Psychiatric/Behavioral: Negative for dysphoric mood. The patient is not nervous/anxious.         Objective:   Physical Exam  Constitutional: He appears well-developed and well-nourished. No distress.  HENT:  Head: Normocephalic and atraumatic.  Right Ear: External ear normal.  Left Ear: External ear normal.  Nose: Nose normal.  White coating that is non scrapable on tongue  None seen on buccal mucosa or palate or throat  No swelling  No gingival changes   Eyes: Conjunctivae and EOM are normal. Pupils are equal, round, and reactive to light. Right  eye exhibits no discharge. Left eye exhibits no discharge.  Neck: Normal range of motion. Neck supple.  Cardiovascular: Normal rate and regular rhythm.   Abdominal: Soft. Bowel sounds are normal.  Lymphadenopathy:    He has no cervical adenopathy.  Neurological: He is alert.  Skin: Skin is warm and dry. No rash noted. No erythema. No pallor.  Psychiatric: He has a normal mood and affect.          Assessment & Plan:   Problem List Items Addressed This Visit      Digestive   Thrush, oral - Primary    After several rounds of antibiotics  Not fully resolved after  nystatin mouthwash  Will tx with oral fluconazole 200 mg daily for 7 d  Also change toothbrush again Disc probiotics  Update if not starting to improve in a week or if worsening        Relevant Medications   fluconazole (DIFLUCAN) 200 MG tablet     Other   PSA, INCREASED    Elevated at last check 6 mo ago  Re check today No symptoms       Relevant Orders   PSA (Completed)    Other Visit Diagnoses    Need for influenza vaccination        Relevant Orders    Flu Vaccine QUAD 36+ mos PF IM (Fluarix & Fluzone Quad PF) (Completed)

## 2015-05-04 NOTE — Assessment & Plan Note (Signed)
After several rounds of antibiotics  Not fully resolved after nystatin mouthwash  Will tx with oral fluconazole 200 mg daily for 7 d  Also change toothbrush again Disc probiotics  Update if not starting to improve in a week or if worsening

## 2015-05-04 NOTE — Assessment & Plan Note (Signed)
Elevated at last check 6 mo ago  Re check today No symptoms

## 2015-05-06 ENCOUNTER — Encounter (HOSPITAL_COMMUNITY)
Admission: RE | Admit: 2015-05-06 | Discharge: 2015-05-06 | Disposition: A | Discharge status: Home / Self Care | Payer: Medicare HMO | Source: Ambulatory Visit | Attending: Cardiovascular Disease | Admitting: Cardiovascular Disease

## 2015-05-06 DIAGNOSIS — Z48812 Encounter for surgical aftercare following surgery on the circulatory system: Secondary | ICD-10-CM | POA: Diagnosis not present

## 2015-05-06 DIAGNOSIS — Z955 Presence of coronary angioplasty implant and graft: Secondary | ICD-10-CM | POA: Diagnosis not present

## 2015-05-08 ENCOUNTER — Encounter (HOSPITAL_COMMUNITY)
Admission: RE | Admit: 2015-05-08 | Discharge: 2015-05-08 | Disposition: A | Discharge status: Home / Self Care | Payer: Medicare HMO | Source: Ambulatory Visit | Attending: Cardiovascular Disease | Admitting: Cardiovascular Disease

## 2015-05-08 ENCOUNTER — Telehealth: Payer: Self-pay

## 2015-05-08 DIAGNOSIS — K1379 Other lesions of oral mucosa: Secondary | ICD-10-CM

## 2015-05-08 DIAGNOSIS — Z48812 Encounter for surgical aftercare following surgery on the circulatory system: Secondary | ICD-10-CM | POA: Diagnosis not present

## 2015-05-08 DIAGNOSIS — B370 Candidal stomatitis: Secondary | ICD-10-CM

## 2015-05-08 DIAGNOSIS — Z955 Presence of coronary angioplasty implant and graft: Secondary | ICD-10-CM | POA: Diagnosis not present

## 2015-05-08 NOTE — Telephone Encounter (Signed)
Pt left v/m; pt seen 05/04/15 for oral thrush and abd discomfort; pt has taken # 5 of # 7 fluconazole and pt does not see any improvement;  pt thinks tongue actually looks worse; pt continues with abd discomfort; is worse especially when jars himself or bends over; feels like something pressing in abd. Pt request cb. [facility].

## 2015-05-08 NOTE — Telephone Encounter (Signed)
Pt will call GI about stomach issues and he is okay with [place] putting in a referral in to see an ENT, I advise pt one of our PCCs will call and schedule an appt., please put referral in

## 2015-05-08 NOTE — Telephone Encounter (Signed)
I know he has seen GI - if abdominal symptoms continue I would let them know  Next step for mouth symptoms would be ref to ENT specialist - let me know if agreeable (I would expect improvement by now)

## 2015-05-10 DIAGNOSIS — K1379 Other lesions of oral mucosa: Secondary | ICD-10-CM | POA: Insufficient documentation

## 2015-05-10 NOTE — Telephone Encounter (Signed)
Ref to ENT done

## 2015-05-11 ENCOUNTER — Encounter (HOSPITAL_COMMUNITY): Payer: Medicare HMO

## 2015-05-13 ENCOUNTER — Encounter (HOSPITAL_COMMUNITY): Payer: Medicare HMO

## 2015-05-15 ENCOUNTER — Encounter (HOSPITAL_COMMUNITY): Payer: Medicare HMO

## 2015-05-18 ENCOUNTER — Encounter (HOSPITAL_COMMUNITY)
Admission: RE | Admit: 2015-05-18 | Discharge: 2015-05-18 | Disposition: A | Discharge status: Home / Self Care | Payer: Medicare HMO | Source: Ambulatory Visit | Attending: Cardiovascular Disease | Admitting: Cardiovascular Disease

## 2015-05-18 DIAGNOSIS — Z48812 Encounter for surgical aftercare following surgery on the circulatory system: Secondary | ICD-10-CM | POA: Diagnosis not present

## 2015-05-18 DIAGNOSIS — Z955 Presence of coronary angioplasty implant and graft: Secondary | ICD-10-CM | POA: Diagnosis not present

## 2015-05-19 DIAGNOSIS — K146 Glossodynia: Secondary | ICD-10-CM | POA: Diagnosis not present

## 2015-05-19 DIAGNOSIS — B3781 Candidal esophagitis: Secondary | ICD-10-CM | POA: Diagnosis not present

## 2015-05-19 DIAGNOSIS — B370 Candidal stomatitis: Secondary | ICD-10-CM | POA: Diagnosis not present

## 2015-05-20 ENCOUNTER — Encounter (HOSPITAL_COMMUNITY)
Admission: RE | Admit: 2015-05-20 | Discharge: 2015-05-20 | Disposition: A | Discharge status: Home / Self Care | Payer: Medicare HMO | Source: Ambulatory Visit | Attending: Cardiovascular Disease | Admitting: Cardiovascular Disease

## 2015-05-20 DIAGNOSIS — Z48812 Encounter for surgical aftercare following surgery on the circulatory system: Secondary | ICD-10-CM | POA: Diagnosis not present

## 2015-05-20 DIAGNOSIS — Z955 Presence of coronary angioplasty implant and graft: Secondary | ICD-10-CM | POA: Diagnosis not present

## 2015-05-25 ENCOUNTER — Encounter (HOSPITAL_COMMUNITY)
Admission: RE | Admit: 2015-05-25 | Discharge: 2015-05-25 | Disposition: A | Discharge status: Home / Self Care | Payer: Medicare HMO | Source: Ambulatory Visit | Attending: Cardiovascular Disease | Admitting: Cardiovascular Disease

## 2015-05-25 DIAGNOSIS — Z955 Presence of coronary angioplasty implant and graft: Secondary | ICD-10-CM | POA: Diagnosis not present

## 2015-05-25 DIAGNOSIS — Z48812 Encounter for surgical aftercare following surgery on the circulatory system: Secondary | ICD-10-CM | POA: Diagnosis not present

## 2015-05-27 ENCOUNTER — Encounter (HOSPITAL_COMMUNITY)
Admission: RE | Admit: 2015-05-27 | Discharge: 2015-05-27 | Disposition: A | Discharge status: Home / Self Care | Payer: Medicare HMO | Source: Ambulatory Visit | Attending: Cardiovascular Disease | Admitting: Cardiovascular Disease

## 2015-05-27 DIAGNOSIS — Z48812 Encounter for surgical aftercare following surgery on the circulatory system: Secondary | ICD-10-CM | POA: Insufficient documentation

## 2015-05-27 DIAGNOSIS — Z955 Presence of coronary angioplasty implant and graft: Secondary | ICD-10-CM | POA: Insufficient documentation

## 2015-05-29 ENCOUNTER — Encounter (HOSPITAL_COMMUNITY)
Admission: RE | Admit: 2015-05-29 | Discharge: 2015-05-29 | Disposition: A | Discharge status: Home / Self Care | Payer: Medicare HMO | Source: Ambulatory Visit | Attending: Cardiovascular Disease | Admitting: Cardiovascular Disease

## 2015-05-29 DIAGNOSIS — Z48812 Encounter for surgical aftercare following surgery on the circulatory system: Secondary | ICD-10-CM | POA: Diagnosis not present

## 2015-05-29 DIAGNOSIS — Z955 Presence of coronary angioplasty implant and graft: Secondary | ICD-10-CM | POA: Diagnosis not present

## 2015-05-29 NOTE — Progress Notes (Signed)
Pt graduated from cardiac rehab program today with completion of 36 exercise sessions in Phase II. Pt maintained good attendance to exercise and education classes.  Pt progressed nicely during his participation in rehab as evidenced by increased MET level.  Pt MET level increased from 4.2 to 7.2.  Medication list reconciled. Repeat  PHQ score- 0 .  Pt has made significant lifestyle changes and should be commended for his success. Pt feels he has achieved his goals during cardiac rehab. Pt feels confident to do the things again such as playing with grandkids.  Pt did have setback as he dealt with issues with gi and thrush. Pt feels he has turned the corner and is feeling better.  Pt also desired to lose weight.  Pt wanted to achieve a weight of 75-180.  Pt presently weights about 182 however he has been as low as 178.  Pt feels he will be successful in losing the additional weight.  Pt feels he has the tools to be successful in adhering to heart healthy lifestyle changes. Pt reminded that due to the exercise effort he demonstrates he will need an extended rest time to transition from exercise to rest. Pt often needed to elevate his feet on a wedge or extended time to achieve an acceptable resting cool down heart rate of within 10 of resting heart.  Pt verbalized understanding of achieving Hr within 10 of his pre exercise resting   Pt plans to continue exercise with silver sneakers at the downtown [place].  Pt plans to exercise at least 4-5 times a week with his wife.  Based on his work ethic shown here in rehab, I feel he will be successful.  It was a true delight to work with this pt in cardiac rehab. Cherre Huger, BSN

## 2015-06-06 DIAGNOSIS — H832X9 Labyrinthine dysfunction, unspecified ear: Secondary | ICD-10-CM | POA: Diagnosis not present

## 2015-06-06 DIAGNOSIS — R42 Dizziness and giddiness: Secondary | ICD-10-CM | POA: Diagnosis not present

## 2015-06-17 DIAGNOSIS — H6123 Impacted cerumen, bilateral: Secondary | ICD-10-CM | POA: Diagnosis not present

## 2015-06-17 DIAGNOSIS — H903 Sensorineural hearing loss, bilateral: Secondary | ICD-10-CM | POA: Diagnosis not present

## 2015-06-17 DIAGNOSIS — H9201 Otalgia, right ear: Secondary | ICD-10-CM | POA: Diagnosis not present

## 2015-07-07 DIAGNOSIS — H9201 Otalgia, right ear: Secondary | ICD-10-CM | POA: Diagnosis not present

## 2015-07-09 ENCOUNTER — Other Ambulatory Visit: Payer: Self-pay | Admitting: Otolaryngology

## 2015-07-09 DIAGNOSIS — H9201 Otalgia, right ear: Secondary | ICD-10-CM

## 2015-07-10 ENCOUNTER — Ambulatory Visit (INDEPENDENT_AMBULATORY_CARE_PROVIDER_SITE_OTHER): Payer: Medicare HMO | Admitting: Family Medicine

## 2015-07-10 ENCOUNTER — Ambulatory Visit (INDEPENDENT_AMBULATORY_CARE_PROVIDER_SITE_OTHER)
Admission: RE | Admit: 2015-07-10 | Discharge: 2015-07-10 | Disposition: A | Discharge status: Home / Self Care | Payer: Medicare HMO | Source: Ambulatory Visit | Attending: Family Medicine | Admitting: Family Medicine

## 2015-07-10 ENCOUNTER — Encounter: Payer: Self-pay | Admitting: Family Medicine

## 2015-07-10 VITALS — BP 122/62 | HR 57 | Temp 98.1°F | Wt 182.2 lb

## 2015-07-10 DIAGNOSIS — M47816 Spondylosis without myelopathy or radiculopathy, lumbar region: Secondary | ICD-10-CM | POA: Diagnosis not present

## 2015-07-10 DIAGNOSIS — M545 Low back pain, unspecified: Secondary | ICD-10-CM

## 2015-07-10 MED ORDER — CYCLOBENZAPRINE HCL 10 MG PO TABS: 5.0000 mg | ORAL_TABLET | Freq: Three times a day (TID) | ORAL | Status: DC | PRN

## 2015-07-10 NOTE — Progress Notes (Signed)
Subjective:    Patient ID: Brian Todd, male    DOB: [birth year], 69 y.o.   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 Mouth pain 05/10/2015  . Thrush, oral 05/04/2015  . Coronary artery disease involving native coronary artery without angina pectoris 04/09/2015  . Abdominal pain, left lower quadrant 04/08/2015  . Bilateral lower abdominal pain 03/31/2015  . Tick bite 01/28/2015  . Effort angina ([place]) 01/07/2015  . Chest pain 01/01/2015  . History of heart artery stent 01/01/2015  . Headache 01/01/2015  . Rectal bleeding 11/08/2014  . Colitis 11/08/2014  . Encounter for Medicare annual wellness exam 10/31/2014  . UTI (urinary tract infection) 04/28/2014  . Contact dermatitis 01/28/2013  . Tinea pedis 01/28/2013  . Physical exam, annual 09/14/2012  . DEGENERATIVE JOINT DISEASE 07/30/2009  . PSA, INCREASED 07/30/2009  . HEARING LOSS, MILD 11/27/2007  . IRRITABLE BOWEL SYNDROME 11/27/2007  . GALLBLADDER DISEASE 11/27/2007  . Hyperlipidemia 08/13/2007  . Essential hypertension 08/13/2007  . GERD 08/13/2007   Past Medical History  Diagnosis Date  . Hearing loss   . Hypertension   . Coronary artery disease   . Hyperlipidemia   . GERD (gastroesophageal reflux disease)   . IBS (irritable bowel syndrome)   .  Gallbladder disease   . Increased prostate specific antigen (PSA) velocity   . DJD (degenerative joint disease)   . Colitis    Past Surgical History  Procedure Laterality Date  . Tonsillectomy and adenoidectomy      in the 2nd grade  . Coronary stent placement      Dr. [name]  . Colonoscopy    . Cardiac catheterization    . Coronary stent placement  01/07/2015    LAD  . Cardiac catheterization N/A 01/07/2015    Procedure: Left Heart Cath and Coronary Angiography;  Surgeon: [name], MD;  Location: [facility];  Service: Cardiovascular;  Laterality: N/A;  . Cardiac catheterization N/A 01/07/2015    Procedure: Coronary Stent Intervention;  Surgeon: [name], MD;  Location: [facility];  Service: Cardiovascular;  Laterality: N/A;   Social History  Substance Use Topics  . Smoking status: Never Smoker   . Smokeless tobacco: Never Used  . Alcohol Use: No     Comment: rare use   Family History  Problem Relation Age of Onset  . Stroke Mother    Allergies  Allergen Reactions  . Flagyl [Metronidazole]    Current Outpatient Prescriptions on File Prior to Visit  Medication Sig Dispense Refill  . aspirin EC 81 MG tablet Take 1 tablet (81 mg total) by mouth daily.    Marland Kitchen  atorvastatin (LIPITOR) 80 MG tablet Take 0.5 tablets (40 mg total) by mouth daily. 30 tablet 6  . clopidogrel (PLAVIX) 75 MG tablet Take 1 tablet (75 mg total) by mouth daily. 90 tablet 3  . metoprolol (LOPRESSOR) 50 MG tablet TAKE 1/2 TABLET BY MOUTH 2 TIMES DAILY 90 tablet 3  . Multiple Vitamins-Minerals (MULTIVITAMIN & MINERAL PO) Take 1 tablet by mouth daily.    . nitroGLYCERIN (NITROSTAT) 0.4 MG SL tablet Place 1 tablet (0.4 mg total) under the tongue every 5 (five) minutes as needed for chest pain. 25 tablet 3  . Omega-3 Krill Oil 500 MG CAPS Take 1 capsule by mouth daily.    . pantoprazole (PROTONIX) 40 MG tablet Take 1 tablet (40 mg total) by mouth 2 (two) times daily before a meal. 180  tablet 3  . ramipril (ALTACE) 10 MG capsule Take 1 capsule (10 mg total) by mouth daily. 90 capsule 3   No current facility-administered medications on file prior to visit.      Review of Systems    Review of Systems  Constitutional: Negative for fever, appetite change, fatigue and unexpected weight change.  Eyes: Negative for pain and visual disturbance.  Respiratory: Negative for cough and shortness of breath.   Cardiovascular: Negative for cp or palpitations    Gastrointestinal: Negative for nausea, diarrhea and constipation.  Genitourinary: Negative for urgency and frequency.  Skin: Negative for pallor or rash   MSK pos for back pain rad to R buttock Neurological: Negative for weakness, light-headedness, numbness and headaches.  Hematological: Negative for adenopathy. Does not bruise/bleed easily.  Psychiatric/Behavioral: Negative for dysphoric mood. The patient is not nervous/anxious.      Objective:   Physical Exam  Constitutional: He appears well-developed and well-nourished. No distress.  HENT:  Head: Normocephalic and atraumatic.  Eyes: Conjunctivae and EOM are normal. Pupils are equal, round, and reactive to light. No scleral icterus.  Neck: Normal range of motion. Neck supple.  Cardiovascular: Normal rate and regular rhythm.   Pulmonary/Chest: Effort normal and breath sounds normal. He has no wheezes. He has no rales.  Abdominal: Soft. Bowel sounds are normal. He exhibits no distension. There is no tenderness.  Musculoskeletal: He exhibits tenderness.       Lumbar back: He exhibits decreased range of motion, tenderness and spasm. He exhibits no bony tenderness and no edema.  Neg SLR No neuro changes  Some piriformis pain when pt externally rotates R hip  Gait is slow and steady   LS flex 30 deg and ext 10 deg with pain  Pain with L lateral bend also  Lymphadenopathy:    He has no cervical adenopathy.  Neurological: He is alert. He has normal strength and normal  reflexes. He displays no atrophy. No cranial nerve deficit or sensory deficit. He exhibits normal muscle tone. Coordination normal.  Negative SLR  Skin: Skin is warm and dry. No rash noted. No erythema. No pallor.  Psychiatric: He has a normal mood and affect.          Assessment & Plan:   Problem List Items Addressed This Visit      Other   Low back pain - Primary    On R side that rad to hip (not leg) Reassuring exam Xray today in light of recurrent nature  Px flexeril prn with caution for spasm  Heat recommended  Sport med handout with stretches provided and reviewed       Relevant Medications   cyclobenzaprine (FLEXERIL)  10 MG tablet   Other Relevant Orders   DG Lumbar Spine Complete (Completed)

## 2015-07-10 NOTE — Progress Notes (Signed)
Pre visit review using our clinic review tool, if applicable. No additional management support is needed unless otherwise documented below in the visit note. 

## 2015-07-10 NOTE — Patient Instructions (Signed)
I think you have back spasm- lumbar  Use heat every chance you can for 10 minutes at a time (do not sleep on it)  Stretch as much as it will let you  Walk as much as you can  Try flexeril for muscle spasms with caution of sedation Tylenol is ok  Xray today -we will update you with a result

## 2015-07-12 NOTE — Assessment & Plan Note (Signed)
On R side that rad to hip (not leg) Reassuring exam Xray today in light of recurrent nature  Px flexeril prn with caution for spasm  Heat recommended  Sport med handout with stretches provided and reviewed

## 2015-07-29 ENCOUNTER — Ambulatory Visit
Admission: RE | Admit: 2015-07-29 | Discharge: 2015-07-29 | Disposition: A | Discharge status: Home / Self Care | Payer: Medicare HMO | Source: Ambulatory Visit | Attending: Otolaryngology | Admitting: Otolaryngology

## 2015-07-29 DIAGNOSIS — H9201 Otalgia, right ear: Secondary | ICD-10-CM | POA: Diagnosis not present

## 2015-07-29 MED ORDER — GADOBENATE DIMEGLUMINE 529 MG/ML IV SOLN
17.0000 mL | Freq: Once | INTRAVENOUS | Status: AC | PRN
  Administered 2015-07-29: 17 mL via INTRAVENOUS

## 2015-08-11 DIAGNOSIS — R69 Illness, unspecified: Secondary | ICD-10-CM | POA: Diagnosis not present

## 2015-10-05 ENCOUNTER — Encounter: Payer: Self-pay | Admitting: Cardiovascular Disease

## 2015-10-05 ENCOUNTER — Ambulatory Visit (INDEPENDENT_AMBULATORY_CARE_PROVIDER_SITE_OTHER): Payer: Medicare HMO | Admitting: Cardiovascular Disease

## 2015-10-05 VITALS — BP 128/68 | HR 53 | Ht 68.0 in | Wt 190.2 lb

## 2015-10-05 DIAGNOSIS — I10 Essential (primary) hypertension: Secondary | ICD-10-CM | POA: Diagnosis not present

## 2015-10-05 DIAGNOSIS — I2510 Atherosclerotic heart disease of native coronary artery without angina pectoris: Secondary | ICD-10-CM | POA: Diagnosis not present

## 2015-10-05 MED ORDER — PANTOPRAZOLE SODIUM 40 MG PO TBEC: 40.0000 mg | DELAYED_RELEASE_TABLET | Freq: Every day | ORAL | Status: DC

## 2015-10-05 NOTE — Patient Instructions (Addendum)
Medication Instructions: Decrease Protonix to 40 mg once daily.   Labwork: None.   Procedures/Testing: None.   Follow-Up: 1 year follow up with Dr. Fletcher Anon.   Any Additional Special Instructions Will Be Listed Below (If Applicable).     If you need a refill on your cardiac medications before your next appointment, please call your pharmacy.

## 2015-10-05 NOTE — Progress Notes (Signed)
Cardiology Office Note   Date:  10/05/2015   ID:  Brian POMYKALA, DOB Apr 22, 1946, MRN [id number]  PCP:  [name], MD  Cardiologist:   Kathlyn Sacramento, MD   Chief Complaint  Patient presents with  . other    6 month follow up. Meds reviewed by the patient verbally. "doing well."       History of Present Illness: Brian Todd is a 70 y.o. male who presents for a follow-up visit regarding coronary artery disease. He had a small non-ST elevation myocardial infarction in 2001. Cardiac catheterization showed a 95% mid LAD stenosis and 40% stenosis in OM branch. He had successful angioplasty and Taxus drug-eluting stent placement to the mid LAD without complications.  He Had recurrent angina in June 2016. Cardiac catheterization showed 99% in-stent restenosis in the mid LAD extending to the distal edge, 60% mid left circumflex stenosis and mild RCA disease. I performed successful angioplasty and drug-eluting stent placement to the mid LAD.  He has other chronic medical conditions that include hypertension, hyperlipidemia and gastroesophageal reflux disease. He has been doing very well with no recurrent angina. He has been taking his medications regularly. He did have recurrent abdominal pain that was felt to be due to Flagyl. He is concerned about the long-term safety of continuing a PPI. He reports no heartburn at the present time.   Past Medical History  Diagnosis Date  . Hearing loss   . Hypertension   . Coronary artery disease   . Hyperlipidemia   . GERD (gastroesophageal reflux disease)   . IBS (irritable bowel syndrome)   . Gallbladder disease   . Increased prostate specific antigen (PSA) velocity   . DJD (degenerative joint disease)   . Colitis     Past Surgical History  Procedure Laterality Date  . Tonsillectomy and adenoidectomy      in the 2nd grade  . Coronary stent placement      Dr. [name]  . Colonoscopy    . Cardiac catheterization    . Coronary stent  placement  01/07/2015    LAD  . Cardiac catheterization N/A 01/07/2015    Procedure: Left Heart Cath and Coronary Angiography;  Surgeon: [name], MD;  Location: [facility];  Service: Cardiovascular;  Laterality: N/A;  . Cardiac catheterization N/A 01/07/2015    Procedure: Coronary Stent Intervention;  Surgeon: [name], MD;  Location: [facility];  Service: Cardiovascular;  Laterality: N/A;     Current Outpatient Prescriptions  Medication Sig Dispense Refill  . aspirin EC 81 MG tablet Take 1 tablet (81 mg total) by mouth daily.    Marland Kitchen atorvastatin (LIPITOR) 40 MG tablet Take 40 mg by mouth daily.    . clopidogrel (PLAVIX) 75 MG tablet Take 1 tablet (75 mg total) by mouth daily. 90 tablet 3  . cyclobenzaprine (FLEXERIL) 10 MG tablet Take 0.5-1 tablets (5-10 mg total) by mouth 3 (three) times daily as needed for muscle spasms (caution of sedation). 30 tablet 0  . metoprolol (LOPRESSOR) 50 MG tablet TAKE 1/2 TABLET BY MOUTH 2 TIMES DAILY 90 tablet 3  . Multiple Vitamins-Minerals (MULTIVITAMIN & MINERAL PO) Take 1 tablet by mouth daily.    . nitroGLYCERIN (NITROSTAT) 0.4 MG SL tablet Place 1 tablet (0.4 mg total) under the tongue every 5 (five) minutes as needed for chest pain. 25 tablet 3  . Omega-3 Krill Oil 500 MG CAPS Take 1 capsule by mouth daily.    Marland Kitchen  pantoprazole (PROTONIX) 40 MG tablet Take 1 tablet (40 mg total) by mouth 2 (two) times daily before a meal. 180 tablet 3  . ramipril (ALTACE) 10 MG capsule Take 1 capsule (10 mg total) by mouth daily. 90 capsule 3   No current facility-administered medications for this visit.    Allergies:   Flagyl    Social History:  The patient  reports that he has never smoked. He has never used smokeless tobacco. He reports that he does not drink alcohol or use illicit drugs.   Family History:  The patient's family history includes Stroke in his mother.    ROS:  Please see the history of present illness.   Otherwise,  review of systems are positive for none.   All other systems are reviewed and negative.    PHYSICAL EXAM: VS:  BP 128/68 mmHg  Pulse 53  Ht 5\' 8"  (1.727 m)  Wt 190 lb 4 oz (86.297 kg)  BMI 28.93 kg/m2 , BMI Body mass index is 28.93 kg/(m^2). GEN: Well nourished, well developed, in no acute distress HEENT: normal Neck: no JVD, carotid bruits, or masses Cardiac: RRR; no murmurs, rubs, or gallops,no edema  Respiratory:  clear to auscultation bilaterally, normal work of breathing GI: soft, nontender, nondistended, + BS MS: no deformity or atrophy Skin: warm and dry, no rash Neuro:  Strength and sensation are intact Psych: euthymic mood, full affect   EKG:  EKG is ordered today. The ekg ordered today demonstrates sinus bradycardia with no significant ST or T wave changes.   Recent Labs: 10/24/2014: TSH 3.25 04/04/2015: ALT 71*; BUN 12; Creatinine, Ser 0.98; Hemoglobin 15.1; Platelets 211; Potassium 4.1; Sodium 136    Lipid Panel    Component Value Date/Time   CHOL 147 10/24/2014 0817   TRIG 171.0* 10/24/2014 0817   HDL 36.80* 10/24/2014 0817   CHOLHDL 4 10/24/2014 0817   VLDL 34.2 10/24/2014 0817   LDLCALC 76 10/24/2014 0817   LDLDIRECT 76.5 09/02/2010 1036      Wt Readings from Last 3 Encounters:  10/05/15 190 lb 4 oz (86.297 kg)  07/10/15 182 lb 4 oz (82.668 kg)  05/04/15 181 lb 12 oz (82.441 kg)        ASSESSMENT AND PLAN:  1.  Coronary artery disease involving native coronary arteries without angina: The patient overall is doing well from a cardiac standpoint. Given that he has a stent within the stent, I will plan on keeping him on long-term dual antiplatelet therapy as tolerated.  2. Hyperlipidemia: Continue high dose atorvastatin. Most recent LDL was 76 which is close to target.  3. Essential hypertension: Blood pressure is well controlled on metoprolol and ramipril.  4. GERD: The patient is concerned about the safety of being on long-term PPI. I decreased  the dose of Protonix to 40 mg once daily. He is having no heartburn symptoms. I encouraged him to discuss with Dr. Glori Bickers if he can come off this medication.       Disposition:   FU with me in 1 year  Signed,  Kathlyn Sacramento, MD  10/05/2015 8:57 AM    [place]

## 2015-10-30 DIAGNOSIS — H0015 Chalazion left lower eyelid: Secondary | ICD-10-CM | POA: Diagnosis not present

## 2015-10-31 ENCOUNTER — Telehealth: Payer: Self-pay | Admitting: Family Medicine

## 2015-10-31 DIAGNOSIS — R972 Elevated prostate specific antigen [PSA]: Secondary | ICD-10-CM

## 2015-10-31 DIAGNOSIS — E785 Hyperlipidemia, unspecified: Secondary | ICD-10-CM

## 2015-10-31 DIAGNOSIS — I10 Essential (primary) hypertension: Secondary | ICD-10-CM

## 2015-10-31 NOTE — Telephone Encounter (Signed)
-----   Message from Marchia Bond sent at 10/27/2015  8:31 AM EDT ----- Regarding: Cpx labs Tues 4/11, need orders. Thanks! :-) Please order  future cpx labs for pt's upcoming lab appt. Thanks Aniceto Boss

## 2015-11-03 ENCOUNTER — Other Ambulatory Visit (INDEPENDENT_AMBULATORY_CARE_PROVIDER_SITE_OTHER): Payer: Medicare HMO

## 2015-11-03 DIAGNOSIS — E785 Hyperlipidemia, unspecified: Secondary | ICD-10-CM | POA: Diagnosis not present

## 2015-11-03 DIAGNOSIS — I10 Essential (primary) hypertension: Secondary | ICD-10-CM

## 2015-11-03 DIAGNOSIS — R972 Elevated prostate specific antigen [PSA]: Secondary | ICD-10-CM

## 2015-11-03 LAB — COMPREHENSIVE METABOLIC PANEL
ALBUMIN: 4 g/dL (ref 3.5–5.2)
ALT: 23 U/L (ref 0–53)
AST: 21 U/L (ref 0–37)
Alkaline Phosphatase: 86 U/L (ref 39–117)
BUN: 15 mg/dL (ref 6–23)
CHLORIDE: 106 meq/L (ref 96–112)
CO2: 33 mEq/L — ABNORMAL HIGH (ref 19–32)
Calcium: 9.7 mg/dL (ref 8.4–10.5)
Creatinine, Ser: 1.05 mg/dL (ref 0.40–1.50)
GFR: 74.65 mL/min (ref >60.00)
Glucose, Bld: 93 mg/dL (ref 70–99)
POTASSIUM: 4.5 meq/L (ref 3.5–5.1)
SODIUM: 144 meq/L (ref 135–145)
Total Bilirubin: 0.8 mg/dL (ref 0.2–1.2)
Total Protein: 6.6 g/dL (ref 6.0–8.3)

## 2015-11-03 LAB — CBC WITH DIFFERENTIAL/PLATELET
BASOS PCT: 0.4 % (ref 0.0–3.0)
Basophils Absolute: 0 10*3/uL (ref 0.0–0.1)
EOS PCT: 2.2 % (ref 0.0–5.0)
Eosinophils Absolute: 0.2 10*3/uL (ref 0.0–0.7)
HCT: 43.7 % (ref 39.0–52.0)
HEMOGLOBIN: 14.7 g/dL (ref 13.0–17.0)
LYMPHS ABS: 2.1 10*3/uL (ref 0.7–4.0)
Lymphocytes Relative: 25.1 % (ref 12.0–46.0)
MCHC: 33.6 g/dL (ref 30.0–36.0)
MCV: 90.7 fl (ref 78.0–100.0)
MONO ABS: 0.9 10*3/uL (ref 0.1–1.0)
Monocytes Relative: 10.2 % (ref 3.0–12.0)
Neutro Abs: 5.2 10*3/uL (ref 1.4–7.7)
Neutrophils Relative %: 62.1 % (ref 43.0–77.0)
Platelets: 209 10*3/uL (ref 150.0–400.0)
RBC: 4.81 Mil/uL (ref 4.22–5.81)
RDW: 13.6 % (ref 11.5–15.5)
WBC: 8.4 10*3/uL (ref 4.0–10.5)

## 2015-11-03 LAB — LIPID PANEL
CHOLESTEROL: 114 mg/dL (ref 0–200)
HDL: 44.1 mg/dL (ref >39.00)
LDL CALC: 53 mg/dL (ref 0–99)
NONHDL: 69.77
TRIGLYCERIDES: 83 mg/dL (ref 0.0–149.0)
Total CHOL/HDL Ratio: 3
VLDL: 16.6 mg/dL (ref 0.0–40.0)

## 2015-11-03 LAB — TSH: TSH: 3.19 u[IU]/mL (ref 0.35–4.50)

## 2015-11-03 LAB — PSA: PSA: 1.84 ng/mL (ref 0.10–4.00)

## 2015-11-06 ENCOUNTER — Encounter: Payer: Medicare HMO | Admitting: Family Medicine

## 2015-11-17 ENCOUNTER — Ambulatory Visit (INDEPENDENT_AMBULATORY_CARE_PROVIDER_SITE_OTHER): Payer: Medicare HMO | Admitting: Family Medicine

## 2015-11-17 ENCOUNTER — Encounter: Payer: Self-pay | Admitting: Family Medicine

## 2015-11-17 VITALS — BP 126/78 | HR 55 | Temp 98.3°F | Ht 67.5 in | Wt 188.8 lb

## 2015-11-17 DIAGNOSIS — Z0000 Encounter for general adult medical examination without abnormal findings: Secondary | ICD-10-CM | POA: Insufficient documentation

## 2015-11-17 DIAGNOSIS — I10 Essential (primary) hypertension: Secondary | ICD-10-CM | POA: Diagnosis not present

## 2015-11-17 DIAGNOSIS — E785 Hyperlipidemia, unspecified: Secondary | ICD-10-CM | POA: Diagnosis not present

## 2015-11-17 DIAGNOSIS — R972 Elevated prostate specific antigen [PSA]: Secondary | ICD-10-CM

## 2015-11-17 DIAGNOSIS — K219 Gastro-esophageal reflux disease without esophagitis: Secondary | ICD-10-CM

## 2015-11-17 DIAGNOSIS — Z8719 Personal history of other diseases of the digestive system: Secondary | ICD-10-CM

## 2015-11-17 MED ORDER — RAMIPRIL 10 MG PO CAPS: 10.0000 mg | ORAL_CAPSULE | Freq: Every day | ORAL | Status: DC

## 2015-11-17 MED ORDER — ATORVASTATIN CALCIUM 40 MG PO TABS: 40.0000 mg | ORAL_TABLET | Freq: Every day | ORAL | Status: DC

## 2015-11-17 MED ORDER — ZOSTER VACCINE LIVE 19400 UNT/0.65ML ~~LOC~~ SUSR: 0.6500 mL | Freq: Once | SUBCUTANEOUS | Status: DC

## 2015-11-17 MED ORDER — METOPROLOL TARTRATE 50 MG PO TABS: ORAL_TABLET | ORAL | Status: DC

## 2015-11-17 MED ORDER — PANTOPRAZOLE SODIUM 40 MG PO TBEC: 40.0000 mg | DELAYED_RELEASE_TABLET | Freq: Every day | ORAL | Status: DC

## 2015-11-17 NOTE — Progress Notes (Signed)
Subjective:    Patient ID: Brian Todd, male    DOB: Feb 23, 1946, 70 y.o.   MRN: [id number]  HPI Here for health maintenance exam and to review chronic medical problems    Feeling good overall !  Has had a break from health problems    He has a visit planned for [facility] in May  Zoster vaccine - does want to get one  Knows it is covered at a pharmacy   Flu shot 10/16  Td 2/12  Colonoscopy 5/16 normal -10 year recall  Pneumonia vaccines completed   Prostate screen Hx of elevated psa in the past (when he had an infection) Lab Results  Component Value Date   PSA 1.84 11/03/2015   PSA 1.63 05/04/2015   PSA 2.94 10/24/2014    This is improved last 2 times  No urinary symptoms   Family hx -no new family hx   bp is stable today  No cp or palpitations or headaches or edema  No side effects to medicines  BP Readings from Last 3 Encounters:  11/17/15 126/78  10/05/15 128/68  07/10/15 122/62     CAD- stable at last cardiol visit     Chemistry      Component Value Date/Time   NA 144 11/03/2015 0813   NA 141 01/02/2015 1558   K 4.5 11/03/2015 0813   CL 106 11/03/2015 0813   CO2 33* 11/03/2015 0813   BUN 15 11/03/2015 0813   BUN 15 01/02/2015 1558   CREATININE 1.05 11/03/2015 0813      Component Value Date/Time   CALCIUM 9.7 11/03/2015 0813   ALKPHOS 86 11/03/2015 0813   AST 21 11/03/2015 0813   ALT 23 11/03/2015 0813   BILITOT 0.8 11/03/2015 0813       On protonix for GERD- lowered dose to 40 mg once daily (he has not decreased it yet)    Wants to change liptior to 40 tab  Lab Results  Component Value Date   CHOL 114 11/03/2015   CHOL 147 10/24/2014   CHOL 140 09/16/2013   Lab Results  Component Value Date   HDL 44.10 11/03/2015   HDL 36.80* 10/24/2014   HDL 42.90 09/16/2013   Lab Results  Component Value Date   LDLCALC 53 11/03/2015   LDLCALC 76 10/24/2014   LDLCALC 68 09/16/2013   Lab Results  Component Value Date   TRIG 83.0 11/03/2015    TRIG 171.0* 10/24/2014   TRIG 146.0 09/16/2013   Lab Results  Component Value Date   CHOLHDL 3 11/03/2015   CHOLHDL 4 10/24/2014   CHOLHDL 3 09/16/2013   Lab Results  Component Value Date   LDLDIRECT 76.5 09/02/2010    Great cholesterol control  Is eating fairly well -occ junk (worse on vacation)  Wt is up 6 lb from dec  bmi is 29  Patient Active Problem List   Diagnosis Date Noted  . Routine general medical examination at a health care facility 11/17/2015  . Low back pain 07/10/2015  . Coronary artery disease involving native coronary artery without angina pectoris 04/09/2015  . Abdominal pain, left lower quadrant 04/08/2015  . Bilateral lower abdominal pain 03/31/2015  . Effort angina ([place]) 01/07/2015  . Chest pain 01/01/2015  . History of heart artery stent 01/01/2015  . Rectal bleeding 11/08/2014  . History of colitis 11/08/2014  . Encounter for Medicare annual wellness exam 10/31/2014  . DEGENERATIVE JOINT DISEASE 07/30/2009  . PSA, INCREASED 07/30/2009  . HEARING  LOSS, MILD 11/27/2007  . IRRITABLE BOWEL SYNDROME 11/27/2007  . GALLBLADDER DISEASE 11/27/2007  . Hyperlipidemia 08/13/2007  . Essential hypertension 08/13/2007  . GERD 08/13/2007   Past Medical History  Diagnosis Date  . Hearing loss   . Hypertension   . Coronary artery disease   . Hyperlipidemia   . GERD (gastroesophageal reflux disease)   . IBS (irritable bowel syndrome)   . Gallbladder disease   . Increased prostate specific antigen (PSA) velocity   . DJD (degenerative joint disease)   . Colitis    Past Surgical History  Procedure Laterality Date  . Tonsillectomy and adenoidectomy      in the 2nd grade  . Coronary stent placement      Dr. [name]  . Colonoscopy    . Cardiac catheterization    . Coronary stent placement  01/07/2015    LAD  . Cardiac catheterization N/A 01/07/2015    Procedure: Left Heart Cath and Coronary Angiography;  Surgeon: [name], MD;  Location: [facility];  Service: Cardiovascular;  Laterality: N/A;  . Cardiac catheterization N/A 01/07/2015    Procedure: Coronary Stent Intervention;  Surgeon: [name], MD;  Location: [facility];  Service: Cardiovascular;  Laterality: N/A;   Social History  Substance Use Topics  . Smoking status: Never Smoker   . Smokeless tobacco: Never Used  . Alcohol Use: No     Comment: rare use   Family History  Problem Relation Age of Onset  . Stroke Mother    Allergies  Allergen Reactions  . Flagyl [Metronidazole]    Current Outpatient Prescriptions on File Prior to Visit  Medication Sig Dispense Refill  . aspirin EC 81 MG tablet Take 1 tablet (81 mg total) by mouth daily.    . clopidogrel (PLAVIX) 75 MG tablet Take 1 tablet (75 mg total) by mouth daily. 90 tablet 3  . Multiple Vitamins-Minerals (MULTIVITAMIN & MINERAL PO) Take 1 tablet by mouth daily.    . nitroGLYCERIN (NITROSTAT) 0.4 MG SL tablet Place 1 tablet (0.4 mg total) under the tongue every 5 (five) minutes as needed for chest pain. 25 tablet 3  . Omega-3 Krill Oil 500 MG CAPS Take 1 capsule by mouth daily.     No current facility-administered medications on file prior to visit.    Review of Systems Review of Systems  Constitutional: Negative for fever, appetite change, fatigue and unexpected weight change.  Eyes: Negative for pain and visual disturbance.  Respiratory: Negative for cough and shortness of breath.   Cardiovascular: Negative for cp or palpitations    Gastrointestinal: Negative for nausea, diarrhea and constipation.  Genitourinary: Negative for urgency and frequency.  Skin: Negative for pallor or rash   Neurological: Negative for weakness, light-headedness, numbness and headaches.  Hematological: Negative for adenopathy. Does not bruise/bleed easily.  Psychiatric/Behavioral: Negative for dysphoric mood. The patient is not nervous/anxious.         Objective:   Physical Exam  Constitutional: He  appears well-developed and well-nourished. No distress.  Well appearing   HENT:  Head: Normocephalic and atraumatic.  Right Ear: External ear normal.  Left Ear: External ear normal.  Nose: Nose normal.  Mouth/Throat: Oropharynx is clear and moist.  Scant cerumen bilat  Eyes: Conjunctivae and EOM are normal. Pupils are equal, round, and reactive to light. Right eye exhibits no discharge. Left eye exhibits no discharge. No scleral icterus.  Neck: Normal range of motion. Neck supple. No JVD present. Carotid  bruit is not present. No thyromegaly present.  Cardiovascular: Normal rate, regular rhythm, normal heart sounds and intact distal pulses.  Exam reveals no gallop.   Pulmonary/Chest: Effort normal and breath sounds normal. No respiratory distress. He has no wheezes. He exhibits no tenderness.  Abdominal: Soft. Bowel sounds are normal. He exhibits no distension, no abdominal bruit and no mass. There is no tenderness.  Musculoskeletal: He exhibits no edema or tenderness.  Lymphadenopathy:    He has no cervical adenopathy.  Neurological: He is alert. He has normal reflexes. No cranial nerve deficit. He exhibits normal muscle tone. Coordination normal.  Skin: Skin is warm and dry. No rash noted. No erythema. No pallor.  Some solar lentigines  Some SK  Psychiatric: He has a normal mood and affect.          Assessment & Plan:   Problem List Items Addressed This Visit      Cardiovascular and Mediastinum   Essential hypertension - Primary    bp in fair control at this time  BP Readings from Last 1 Encounters:  11/17/15 126/78   No changes needed Disc lifstyle change with low sodium diet and exercise  Labs reviewed      Relevant Medications   atorvastatin (LIPITOR) 40 MG tablet   ramipril (ALTACE) 10 MG capsule   metoprolol (LOPRESSOR) 50 MG tablet     Digestive   GERD    Doing well Will cut ppi (protonix) to once daily  Disc imp of diet for this  Update if problems        Relevant Medications   Probiotic Product (PROBIOTIC DAILY PO)   pantoprazole (PROTONIX) 40 MG tablet     Other   History of colitis   Hyperlipidemia    Good control with statin and diet Disc goals for lipids and reasons to control them Rev labs with pt Rev low sat fat diet in detail       Relevant Medications   atorvastatin (LIPITOR) 40 MG tablet   ramipril (ALTACE) 10 MG capsule   metoprolol (LOPRESSOR) 50 MG tablet   PSA, INCREASED    This was during a GU infx in the past  Resolved now and the last check  Will continue to follow No symptoms       Routine general medical examination at a health care facility    Reviewed health habits including diet and exercise and skin cancer prevention Reviewed appropriate screening tests for age  Also reviewed health mt list, fam hx and immunization status , as well as social and family history   See HPI Labs reviewed I sent px and also a px for the shingles vaccine to the pharmacy  Try to decreased your protonix to 40 mg once daily -if you have worse symptoms (watch your diet) let us know  Take care of yourself Focus on healthy diet and exercise

## 2015-11-17 NOTE — Progress Notes (Signed)
Pre visit review using our clinic review tool, if applicable. No additional management support is needed unless otherwise documented below in the visit note. 

## 2015-11-17 NOTE — Patient Instructions (Signed)
I sent px and also a px for the shingles vaccine to the pharmacy  Try to decreased your protonix to 40 mg once daily -if you have worse symptoms (watch your diet) let us know  Take care of yourself Focus on healthy diet and exercise

## 2015-11-18 NOTE — Assessment & Plan Note (Signed)
This was during a GU infx in the past  Resolved now and the last check  Will continue to follow No symptoms

## 2015-11-18 NOTE — Assessment & Plan Note (Signed)
bp in fair control at this time  BP Readings from Last 1 Encounters:  11/17/15 126/78   No changes needed Disc lifstyle change with low sodium diet and exercise  Labs reviewed

## 2015-11-18 NOTE — Assessment & Plan Note (Signed)
Good control with statin and diet  Disc goals for lipids and reasons to control them Rev labs with pt Rev low sat fat diet in detail  

## 2015-11-18 NOTE — Assessment & Plan Note (Signed)
Doing well Will cut ppi (protonix) to once daily  Disc imp of diet for this  Update if problems

## 2015-11-18 NOTE — Assessment & Plan Note (Signed)
Reviewed health habits including diet and exercise and skin cancer prevention Reviewed appropriate screening tests for age  Also reviewed health mt list, fam hx and immunization status , as well as social and family history   See HPI Labs reviewed I sent px and also a px for the shingles vaccine to the pharmacy  Try to decreased your protonix to 40 mg once daily -if you have worse symptoms (watch your diet) let us know  Take care of yourself Focus on healthy diet and exercise

## 2015-12-01 ENCOUNTER — Ambulatory Visit (INDEPENDENT_AMBULATORY_CARE_PROVIDER_SITE_OTHER): Payer: Medicare HMO

## 2015-12-01 VITALS — BP 118/66 | HR 56 | Temp 98.0°F | Ht 67.5 in | Wt 189.2 lb

## 2015-12-01 DIAGNOSIS — Z0000 Encounter for general adult medical examination without abnormal findings: Secondary | ICD-10-CM

## 2015-12-01 DIAGNOSIS — Z1159 Encounter for screening for other viral diseases: Secondary | ICD-10-CM | POA: Diagnosis not present

## 2015-12-01 NOTE — Progress Notes (Signed)
Subjective:   Brian Todd is a 70 y.o. male who presents for Medicare Annual/Subsequent preventive examination.  Review of Systems:  N/A  Cardiac Risk Factors include: advanced age (>25men, >46 women);male gender;dyslipidemia;hypertension     Objective:    Vitals: BP 118/66 mmHg  Pulse 56  Temp(Src) 98 F (36.7 C) (Oral)  Ht 5' 7.5" (1.715 m)  Wt 189 lb 4 oz (85.843 kg)  BMI 29.19 kg/m2  SpO2 98%  Body mass index is 29.19 kg/(m^2).  Tobacco History  Smoking status  . Never Smoker   Smokeless tobacco  . Never Used     Counseling given: No   Past Medical History  Diagnosis Date  . Hearing loss   . Hypertension   . Coronary artery disease   . Hyperlipidemia   . GERD (gastroesophageal reflux disease)   . IBS (irritable bowel syndrome)   . Gallbladder disease   . Increased prostate specific antigen (PSA) velocity   . DJD (degenerative joint disease)   . Colitis    Past Surgical History  Procedure Laterality Date  . Tonsillectomy and adenoidectomy      in the 2nd grade  . Coronary stent placement      Dr. [name]  . Colonoscopy    . Cardiac catheterization    . Coronary stent placement  01/07/2015    LAD  . Cardiac catheterization N/A 01/07/2015    Procedure: Left Heart Cath and Coronary Angiography;  Surgeon: [name], MD;  Location: [facility];  Service: Cardiovascular;  Laterality: N/A;  . Cardiac catheterization N/A 01/07/2015    Procedure: Coronary Stent Intervention;  Surgeon: [name], MD;  Location: [facility];  Service: Cardiovascular;  Laterality: N/A;   Family History  Problem Relation Age of Onset  . Stroke Mother    History  Sexual Activity  . Sexual Activity: Yes    Outpatient Encounter Prescriptions as of 12/01/2015  Medication Sig  . aspirin EC 81 MG tablet Take 1 tablet (81 mg total) by mouth daily.  Marland Kitchen atorvastatin (LIPITOR) 40 MG tablet Take 1 tablet (40 mg total) by mouth daily.  . clopidogrel  (PLAVIX) 75 MG tablet Take 1 tablet (75 mg total) by mouth daily.  . metoprolol (LOPRESSOR) 50 MG tablet TAKE 1/2 TABLET BY MOUTH 2 TIMES DAILY  . Multiple Vitamins-Minerals (MULTIVITAMIN & MINERAL PO) Take 1 tablet by mouth daily.  . nitroGLYCERIN (NITROSTAT) 0.4 MG SL tablet Place 1 tablet (0.4 mg total) under the tongue every 5 (five) minutes as needed for chest pain.  . Omega-3 Krill Oil 500 MG CAPS Take 1 capsule by mouth daily.  . pantoprazole (PROTONIX) 40 MG tablet Take 1 tablet (40 mg total) by mouth daily.  . Probiotic Product (PROBIOTIC DAILY PO) Take 1 tablet by mouth daily.  . ramipril (ALTACE) 10 MG capsule Take 1 capsule (10 mg total) by mouth daily.  Marland Kitchen Zoster Vaccine Live, PF, (ZOSTAVAX) 13086 UNT/0.65ML injection Inject 19,400 Units into the skin once.   No facility-administered encounter medications on file as of 12/01/2015.    Activities of Daily Living In your present state of health, do you have any difficulty performing the following activities: 12/01/2015 01/11/2015  Hearing? Y N  Vision? N N  Difficulty concentrating or making decisions? N N  Walking or climbing stairs? N N  Dressing or bathing? N N  Doing errands, shopping? N N  Preparing Food and eating ? N -  Using the Toilet? N -  In the past six months, have you accidently leaked urine? N -  Do you have problems with loss of bowel control? N -  Managing your Medications? N -  Managing your Finances? N -  Housekeeping or managing your Housekeeping? N -    Patient Care Team: Abner Greenspan, MD as PCP - General (Family Medicine)   Assessment:     Hearing Screening   125Hz  250Hz  500Hz  1000Hz  2000Hz  4000Hz  8000Hz   Right ear:   0 0 40 0   Left ear:   0 0 40 0   Vision Screening Comments: Last eye exam in 2015 with Dr. Claudean Kinds. Next appt scheduled 01/2016   Exercise Activities and Dietary recommendations Current Exercise Habits: Home exercise routine, Type of exercise: Other - see  comments;walking;treadmill;yoga ([employer], exercise bike), Time (Minutes): 60, Frequency (Times/Week): 5, Weekly Exercise (Minutes/Week): 300, Intensity: Moderate  Goals    . Increase physical activity     Starting 12/01/2015, I will continue to attend [employer] for 4-5 days per week.       Fall Risk Fall Risk  12/01/2015 11/17/2015 10/31/2014 09/16/2013  Falls in the past year? No No No No   Depression Screen PHQ 2/9 Scores 12/01/2015 11/17/2015 05/29/2015 02/09/2015  PHQ - 2 Score 0 0 0 0    Cognitive Testing MMSE - Mini Mental State Exam 12/01/2015  Orientation to time 5  Orientation to Place 5  Registration 3  Attention/ Calculation 0  Recall 3  Language- name 2 objects 0  Language- repeat 1  Language- follow 3 step command 3  Language- read & follow direction 0  Write a sentence 0  Copy design 0  Total score 20   PLEASE NOTE: A Mini-Cog screen was completed. Maximum score is 20. A value of 0 denotes this part of Folstein MMSE was not completed or the patient failed this part of the Mini-Cog screening.   Mini-Cog Screening Orientation to Time - Max 5 pts Orientation to Place - Max 5 pts Registration - Max 3 pts Recall - Max 3 pts Language Repeat - Max 1 pts Language Follow 3 Step Command - Max 3 pts  Immunization History  Administered Date(s) Administered  . Influenza Split 04/16/2011, 04/26/2012  . Influenza Whole 03/25/2009, 03/25/2010  . Influenza,inj,Quad PF,36+ Mos 04/10/2013, 04/02/2014, 05/04/2015  . Pneumococcal Conjugate-13 10/31/2014  . Pneumococcal Polysaccharide-23 09/16/2013  . Td 09/02/2010   Screening Tests Health Maintenance  Topic Date Due  . ZOSTAVAX  04/24/2016 (Originally 10/19/2007)  . INFLUENZA VACCINE  02/23/2016  . TETANUS/TDAP  09/02/2020  . COLONOSCOPY  12/08/2024  . Hepatitis C Screening  Completed  . PNA vac Low Risk Adult  Completed      Plan:     I have personally reviewed and addressed the Medicare Annual Wellness  questionnaire and have noted the following in the patient's chart:  A. Medical and social history B. Use of alcohol, tobacco or illicit drugs  C. Current medications and supplements D. Functional ability and status E.  Nutritional status F.  Physical activity G. Advance directives H. List of other physicians I.  Hospitalizations, surgeries, and ER visits in previous 12 months J.  [place] to include hearing, vision, cognitive, depression L. Referrals and appointments - none  In addition, I have reviewed and discussed with patient certain preventive protocols, quality metrics, and best practice recommendations. A written personalized care plan for preventive services as well as general preventive health recommendations were provided to patient.  See attached scanned questionnaire for additional information.   Signed,   Lindell Noe, MHA, BS, LPN Health Advisor [phone]

## 2015-12-01 NOTE — Progress Notes (Signed)
PCP notes:  Health maintenance: Hep C screening completed. Pt will get shingles vaccine after future trip out of town.  Screenings: Please review hearing screening results.

## 2015-12-01 NOTE — Progress Notes (Signed)
Pre visit review using our clinic review tool, if applicable. No additional management support is needed unless otherwise documented below in the visit note. 

## 2015-12-01 NOTE — Patient Instructions (Signed)
Brian Todd , Thank you for taking time to come for your Medicare Wellness Visit. I appreciate your ongoing commitment to your health goals. Please review the following plan we discussed and let me know if I can assist you in the future.   These are the goals we discussed: Goals    . Increase physical activity     Starting 12/01/2015, I will continue to attend [employer] for 4-5 days per week.        This is a list of the screening recommended for you and due dates:  Health Maintenance  Topic Date Due  . Shingles Vaccine  04/24/2016*  .  Hepatitis C: One time screening is recommended by Center for Disease Control  (CDC) for  adults born from [birth year] through 1963.   08/26/2020*  . Flu Shot  02/23/2016  . Tetanus Vaccine  09/02/2020  . Colon Cancer Screening  12/08/2024  . Pneumonia vaccines  Completed  *Topic was postponed. The date shown is not the original due date.   Preventive Care for Adults  A healthy lifestyle and preventive care can promote health and wellness. Preventive health guidelines for adults include the following key practices.  . A routine yearly physical is a good way to check with your health care provider about your health and preventive screening. It is a chance to share any concerns and updates on your health and to receive a thorough exam.  . Visit your dentist for a routine exam and preventive care every 6 months. Brush your teeth twice a day and floss once a day. Good oral hygiene prevents tooth decay and gum disease.  . The frequency of eye exams is based on your age, health, family medical history, use  of contact lenses, and other factors. Follow your health care provider's ecommendations for frequency of eye exams.  . Eat a healthy diet. Foods like vegetables, fruits, whole grains, low-fat dairy products, and lean protein foods contain the nutrients you need without too many calories. Decrease your intake of foods high in solid fats, added sugars, and  salt. Eat the right amount of calories for you. Get information about a proper diet from your health care provider, if necessary.  . Regular physical exercise is one of the most important things you can do for your health. Most adults should get at least 150 minutes of moderate-intensity exercise (any activity that increases your heart rate and causes you to sweat) each week. In addition, most adults need muscle-strengthening exercises on 2 or more days a week.  [employer] may be a benefit available to you. To determine eligibility, you may visit the website: www.silversneakers.com or contact program at (409)718-5524 Mon-Fri between 8AM-8PM.   . Maintain a healthy weight. The body mass index (BMI) is a screening tool to identify possible weight problems. It provides an estimate of body fat based on height and weight. Your health care provider can find your BMI and can help you achieve or maintain a healthy weight.   For adults 20 years and older: ? A BMI below 18.5 is considered underweight. ? A BMI of 18.5 to 24.9 is normal. ? A BMI of 25 to 29.9 is considered overweight. ? A BMI of 30 and above is considered obese.   . Maintain normal blood lipids and cholesterol levels by exercising and minimizing your intake of saturated fat. Eat a balanced diet with plenty of fruit and vegetables. Blood tests for lipids and cholesterol should begin at age 38 and  be repeated every 5 years. If your lipid or cholesterol levels are high, you are over 50, or you are at high risk for heart disease, you may need your cholesterol levels checked more frequently. Ongoing high lipid and cholesterol levels should be treated with medicines if diet and exercise are not working.  . If you smoke, find out from your health care provider how to quit. If you do not use tobacco, please do not start.  . If you choose to drink alcohol, please do not consume more than 2 drinks per day. One drink is considered to be 12 ounces  (355 mL) of beer, 5 ounces (148 mL) of wine, or 1.5 ounces (44 mL) of liquor.  . If you are 48-33 years old, ask your health care provider if you should take aspirin to prevent strokes.  . Use sunscreen. Apply sunscreen liberally and repeatedly throughout the day. You should seek shade when your shadow is shorter than you. Protect yourself by wearing long sleeves, pants, a wide-brimmed hat, and sunglasses year round, whenever you are outdoors.  . Once a month, do a whole body skin exam, using a mirror to look at the skin on your back. Tell your health care provider of new moles, moles that have irregular borders, moles that are larger than a pencil eraser, or moles that have changed in shape or color.

## 2015-12-01 NOTE — Progress Notes (Signed)
   Subjective:    Patient ID: Brian Todd, male    DOB: Jun 21, 1946, 70 y.o.   MRN: [id number]  HPI    Review of Systems     Objective:   Physical Exam        Assessment & Plan:  I reviewed health advisor's note, was available for consultation, and agree with documentation and plan.

## 2015-12-02 LAB — HEPATITIS C ANTIBODY: HCV AB: NEGATIVE

## 2015-12-04 ENCOUNTER — Ambulatory Visit (INDEPENDENT_AMBULATORY_CARE_PROVIDER_SITE_OTHER): Payer: Medicare HMO | Admitting: Adult Health

## 2015-12-04 ENCOUNTER — Encounter: Payer: Self-pay | Admitting: Adult Health

## 2015-12-04 VITALS — BP 110/76 | Temp 98.6°F | Wt 188.3 lb

## 2015-12-04 DIAGNOSIS — L259 Unspecified contact dermatitis, unspecified cause: Secondary | ICD-10-CM

## 2015-12-04 MED ORDER — METHYLPREDNISOLONE ACETATE 80 MG/ML IJ SUSP
120.0000 mg | Freq: Once | INTRAMUSCULAR | Status: AC
  Administered 2015-12-04: 120 mg via INTRAMUSCULAR

## 2015-12-04 NOTE — Progress Notes (Signed)
   Subjective:    Patient ID: KHY RYBA, male    DOB: 28-Apr-1946, 70 y.o.   MRN: [id number]  HPI  70 year old male who presents to the office for possible bug bite to right forearm . He was cleaning out an old building about a week ago . A few days after he noticed a bump on his right forearm. His rash has spread and now has several smaller raised areas. He does endorse weeping and a yellow colored crusting from time to time. He also has itching.    Denies any fevers or chills.   Review of Systems  Constitutional: Negative.   Skin: Positive for color change, rash and wound.  All other systems reviewed and are negative.      Objective:   Physical Exam  Constitutional: He is oriented to person, place, and time. He appears well-developed. No distress.  Neurological: He is alert and oriented to person, place, and time.  Skin: Skin is warm and dry. Rash (no discharge noted. ) noted. Rash is nodular and vesicular. He is not diaphoretic. No pallor.  Psychiatric: He has a normal mood and affect. His behavior is normal. Judgment and thought content normal.  Nursing note and vitals reviewed.     Assessment & Plan:  1. Contact dermatitis - Appears as contact dermatitis from poison oak or poison ivy.  - Will give Depo Medrol 120mg  IM injection - methylPREDNISolone acetate (DEPO-MEDROL) injection 120 mg; Inject 1.5 mLs (120 mg total) into the muscle once. - Can use calamine lotion.  - Follow up if no improvement   Dorothyann Peng, NP

## 2015-12-04 NOTE — Patient Instructions (Addendum)
It appears as though you have contact dermatitis from some type of plant, possibly poison oak.   You can use calamine lotion to help with the symptoms.   Follow up as needed  Contact Dermatitis Dermatitis is redness, soreness, and swelling (inflammation) of the skin. Contact dermatitis is a reaction to certain substances that touch the skin. There are two types of contact dermatitis:   Irritant contact dermatitis. This type is caused by something that irritates your skin, such as dry hands from washing them too much. This type does not require previous exposure to the substance for a reaction to occur. This type is more common.  Allergic contact dermatitis. This type is caused by a substance that you are allergic to, such as a nickel allergy or poison ivy. This type only occurs if you have been exposed to the substance (allergen) before. Upon a repeat exposure, your body reacts to the substance. This type is less common. CAUSES  Many different substances can cause contact dermatitis. Irritant contact dermatitis is most commonly caused by exposure to:   Makeup.   Soaps.   Detergents.   Bleaches.   Acids.   Metal salts, such as nickel.  Allergic contact dermatitis is most commonly caused by exposure to:   Poisonous plants.   Chemicals.   Jewelry.   Latex.   Medicines.   Preservatives in products, such as clothing.  RISK FACTORS This condition is more likely to develop in:   People who have jobs that expose them to irritants or allergens.  People who have certain medical conditions, such as asthma or eczema.  SYMPTOMS  Symptoms of this condition may occur anywhere on your body where the irritant has touched you or is touched by you. Symptoms include:  Dryness or flaking.   Redness.   Cracks.   Itching.   Pain or a burning feeling.   Blisters.  Drainage of small amounts of blood or clear fluid from skin cracks. With allergic contact  dermatitis, there may also be swelling in areas such as the eyelids, mouth, or genitals.  DIAGNOSIS  This condition is diagnosed with a medical history and physical exam. A patch skin test may be performed to help determine the cause. If the condition is related to your job, you may need to see an occupational medicine specialist. TREATMENT Treatment for this condition includes figuring out what caused the reaction and protecting your skin from further contact. Treatment may also include:   Steroid creams or ointments. Oral steroid medicines may be needed in more severe cases.  Antibiotics or antibacterial ointments, if a skin infection is present.  Antihistamine lotion or an antihistamine taken by mouth to ease itching.  A bandage (dressing). HOME CARE INSTRUCTIONS Skin Care  Moisturize your skin as needed.   Apply cool compresses to the affected areas.  Try taking a bath with:  Epsom salts. Follow the instructions on the packaging. You can get these at your local pharmacy or grocery store.  Baking soda. Pour a small amount into the bath as directed by your health care provider.  Colloidal oatmeal. Follow the instructions on the packaging. You can get this at your local pharmacy or grocery store.  Try applying baking soda paste to your skin. Stir water into baking soda until it reaches a paste-like consistency.  Do not scratch your skin.  Bathe less frequently, such as every other day.  Bathe in lukewarm water. Avoid using hot water. Medicines  Take or apply over-the-counter and prescription  medicines only as told by your health care provider.   If you were prescribed an antibiotic medicine, take or apply your antibiotic as told by your health care provider. Do not stop using the antibiotic even if your condition starts to improve. General Instructions  Keep all follow-up visits as told by your health care provider. This is important.  Avoid the substance that caused  your reaction. If you do not know what caused it, keep a journal to try to track what caused it. Write down:  What you eat.  What cosmetic products you use.  What you drink.  What you wear in the affected area. This includes jewelry.  If you were given a dressing, take care of it as told by your health care provider. This includes when to change and remove it. SEEK MEDICAL CARE IF:   Your condition does not improve with treatment.  Your condition gets worse.  You have signs of infection such as swelling, tenderness, redness, soreness, or warmth in the affected area.  You have a fever.  You have new symptoms. SEEK IMMEDIATE MEDICAL CARE IF:   You have a severe headache, neck pain, or neck stiffness.  You vomit.  You feel very sleepy.  You notice red streaks coming from the affected area.  Your bone or joint underneath the affected area becomes painful after the skin has healed.  The affected area turns darker.  You have difficulty breathing.   This information is not intended to replace advice given to you by your health care provider. Make sure you discuss any questions you have with your health care provider.   Document Released: 07/08/2000 Document Revised: 04/01/2015 Document Reviewed: 11/26/2014 Elsevier Interactive Patient Education [employer].

## 2016-01-10 ENCOUNTER — Other Ambulatory Visit: Payer: Self-pay | Admitting: Family Medicine

## 2016-01-15 ENCOUNTER — Encounter: Payer: Self-pay | Admitting: Family Medicine

## 2016-01-15 ENCOUNTER — Ambulatory Visit (INDEPENDENT_AMBULATORY_CARE_PROVIDER_SITE_OTHER): Payer: Medicare HMO | Admitting: Family Medicine

## 2016-01-15 VITALS — BP 116/60 | HR 49 | Temp 98.0°F | Ht 67.5 in | Wt 189.0 lb

## 2016-01-15 DIAGNOSIS — L259 Unspecified contact dermatitis, unspecified cause: Secondary | ICD-10-CM | POA: Diagnosis not present

## 2016-01-15 MED ORDER — MOMETASONE FUROATE 0.1 % EX CREA: 1.0000 "application " | TOPICAL_CREAM | Freq: Every day | CUTANEOUS | Status: DC

## 2016-01-15 NOTE — Patient Instructions (Signed)
I think you have some contact dermatitis- ? Cause  Change your detergent to FREE -no color or fragrance  Change soap to Dove to sensitive skin  Shampoo- no color or fragrance  Stop fabric softeners completely  Avoid scents   Try elocon cream on affected areas It not starting to improve in 1-2 weeks - will set you up with dermatology   For itch = try oral zyrtec 10 mg once daily otc

## 2016-01-15 NOTE — Progress Notes (Signed)
Subjective:    Patient ID: Brian Todd, male    DOB: [birth year], 70 y.o.   MRN: [id number]  HPI Has an area to check on his R arm  Started out as a papule that looks like a bug bite or "raw spot"  Then it evolves to a red and swollen area that itches  Now has a red area on R arm where a spot used to be  (scar) New spot on arm now  Then little bumps popped around it (bled briefly a little) Puffy A little sensitive = for instance when his shirt rubbed on his chest  Very very itchy Using benadryl externally   Has not been in any poison ivy he is aware of -but it is possible  Has been on patio and cleaning building  Is around insects   No new products   Has never had shingles before   Exposures Tide pods for detergent  Fabric softener - dryer sheets  Soap- uses men's liquid old spice soap  deoderant - same  Shampoo- uses same body wash  Perfumes- selfom   No new foods  More seafood in May- but already had the rash  No pool/ocean or hot tub   Now has a new spot on his chest   Saw NP Beaulah Dinning NP - in May Dx with poison ivy  Given depo medrol 120 Told to use calamine lotion   Patient Active Problem List   Diagnosis Date Noted  . Contact dermatitis 01/15/2016  . Routine general medical examination at a health care facility 11/17/2015  . Low back pain 07/10/2015  . Coronary artery disease involving native coronary artery without angina pectoris 04/09/2015  . Abdominal pain, left lower quadrant 04/08/2015  . Bilateral lower abdominal pain 03/31/2015  . Effort angina ([place]) 01/07/2015  . Chest pain 01/01/2015  . History of heart artery stent 01/01/2015  . Rectal bleeding 11/08/2014  . History of colitis 11/08/2014  . Encounter for Medicare annual wellness exam 10/31/2014  . DEGENERATIVE JOINT DISEASE 07/30/2009  . PSA, INCREASED 07/30/2009  . HEARING LOSS, MILD 11/27/2007  . IRRITABLE BOWEL SYNDROME 11/27/2007  . GALLBLADDER DISEASE 11/27/2007  .  Hyperlipidemia 08/13/2007  . Essential hypertension 08/13/2007  . GERD 08/13/2007   Past Medical History  Diagnosis Date  . Hearing loss   . Hypertension   . Coronary artery disease   . Hyperlipidemia   . GERD (gastroesophageal reflux disease)   . IBS (irritable bowel syndrome)   . Gallbladder disease   . Increased prostate specific antigen (PSA) velocity   . DJD (degenerative joint disease)   . Colitis    Past Surgical History  Procedure Laterality Date  . Tonsillectomy and adenoidectomy      in the 2nd grade  . Coronary stent placement      Dr. [name]  . Colonoscopy    . Cardiac catheterization    . Coronary stent placement  01/07/2015    LAD  . Cardiac catheterization N/A 01/07/2015    Procedure: Left Heart Cath and Coronary Angiography;  Surgeon: [name], MD;  Location: [facility];  Service: Cardiovascular;  Laterality: N/A;  . Cardiac catheterization N/A 01/07/2015    Procedure: Coronary Stent Intervention;  Surgeon: [name], MD;  Location: [facility];  Service: Cardiovascular;  Laterality: N/A;   Social History  Substance Use Topics  . Smoking status: Never Smoker   . Smokeless tobacco: Never Used  . Alcohol Use:  No     Comment: rare use   Family History  Problem Relation Age of Onset  . Stroke Mother    Allergies  Allergen Reactions  . Flagyl [Metronidazole]    Current Outpatient Prescriptions on File Prior to Visit  Medication Sig Dispense Refill  . aspirin EC 81 MG tablet Take 1 tablet (81 mg total) by mouth daily.    Marland Kitchen atorvastatin (LIPITOR) 40 MG tablet Take 1 tablet (40 mg total) by mouth daily. 90 tablet 3  . clopidogrel (PLAVIX) 75 MG tablet Take 1 tablet (75 mg total) by mouth daily. 90 tablet 3  . metoprolol (LOPRESSOR) 50 MG tablet TAKE 1/2 TABLET BY MOUTH 2 TIMES DAILY 90 tablet 3  . Multiple Vitamins-Minerals (MULTIVITAMIN & MINERAL PO) Take 1 tablet by mouth daily.    . nitroGLYCERIN (NITROSTAT) 0.4 MG SL tablet  Place 1 tablet (0.4 mg total) under the tongue every 5 (five) minutes as needed for chest pain. 25 tablet 3  . Omega-3 Krill Oil 500 MG CAPS Take 1 capsule by mouth daily.    . pantoprazole (PROTONIX) 40 MG tablet Take 1 tablet (40 mg total) by mouth daily. 90 tablet 3  . ramipril (ALTACE) 10 MG capsule Take 1 capsule (10 mg total) by mouth daily. 90 capsule 3  . Zoster Vaccine Live, PF, (ZOSTAVAX) 91478 UNT/0.65ML injection Inject 19,400 Units into the skin once. (Patient not taking: Reported on 01/15/2016) 1 each 0   No current facility-administered medications on file prior to visit.    Review of Systems Review of Systems  Constitutional: Negative for fever, appetite change, fatigue and unexpected weight change.  Eyes: Negative for pain and visual disturbance.  Respiratory: Negative for cough and shortness of breath.   Cardiovascular: Negative for cp or palpitations    Gastrointestinal: Negative for nausea, diarrhea and constipation.  Genitourinary: Negative for urgency and frequency.  Skin: Negative for pallor and pos for itchy rash Neurological: Negative for weakness, light-headedness, numbness and headaches.  Hematological: Negative for adenopathy. Does not bruise/bleed easily.  Psychiatric/Behavioral: Negative for dysphoric mood. The patient is not nervous/anxious.         Objective:   Physical Exam  Constitutional: He appears well-developed and well-nourished. No distress.  Well appearing   HENT:  Head: Normocephalic and atraumatic.  Mouth/Throat: Oropharynx is clear and moist.  Eyes: Conjunctivae and EOM are normal. Pupils are equal, round, and reactive to light. Right eye exhibits no discharge. Left eye exhibits no discharge. No scleral icterus.  Neck: Normal range of motion. Neck supple.  Cardiovascular: Normal rate and regular rhythm.   Pulmonary/Chest: Effort normal. He has no wheezes. He has no rales.  Musculoskeletal: He exhibits no edema.  No acute joint changes     Lymphadenopathy:    He has no cervical adenopathy.  Neurological: He is alert. He has normal reflexes.  Skin: Skin is warm and dry. Rash noted.  R arm - 3 by 5 cm patch of papules/ vesicles with erythema (no scales) Mid chest - 2 by 5 cm area of papules w/o vesicles     Psychiatric: He has a normal mood and affect.          Assessment & Plan:   Problem List Items Addressed This Visit      Musculoskeletal and Integument   Contact dermatitis - Primary    Area on arm and now on chest still resemble contact dermatitis  Disc his exposures in detail (no known exp to poison oak or  ivy)  Will change products to non fragrance/color products  Trial of elocon cream to affected areas  Antihistamine for itch prn  Update if not starting to improve in a week or if worsening  Consider derm ref if not improved

## 2016-01-15 NOTE — Progress Notes (Signed)
Pre visit review using our clinic review tool, if applicable. No additional management support is needed unless otherwise documented below in the visit note. 

## 2016-01-16 NOTE — Assessment & Plan Note (Signed)
Area on arm and now on chest still resemble contact dermatitis  Disc his exposures in detail (no known exp to poison oak or ivy)  Will change products to non fragrance/color products  Trial of elocon cream to affected areas  Antihistamine for itch prn  Update if not starting to improve in a week or if worsening  Consider derm ref if not improved

## 2016-03-01 DIAGNOSIS — H524 Presbyopia: Secondary | ICD-10-CM | POA: Diagnosis not present

## 2016-03-22 DIAGNOSIS — H9311 Tinnitus, right ear: Secondary | ICD-10-CM | POA: Diagnosis not present

## 2016-03-22 DIAGNOSIS — H903 Sensorineural hearing loss, bilateral: Secondary | ICD-10-CM | POA: Diagnosis not present

## 2016-03-22 DIAGNOSIS — H6123 Impacted cerumen, bilateral: Secondary | ICD-10-CM | POA: Diagnosis not present

## 2016-03-29 DIAGNOSIS — R69 Illness, unspecified: Secondary | ICD-10-CM | POA: Diagnosis not present

## 2016-04-09 ENCOUNTER — Other Ambulatory Visit: Payer: Self-pay | Admitting: Cardiovascular Disease

## 2016-04-28 DIAGNOSIS — R69 Illness, unspecified: Secondary | ICD-10-CM | POA: Diagnosis not present

## 2016-08-05 ENCOUNTER — Encounter: Payer: Self-pay | Admitting: Family Medicine

## 2016-08-05 ENCOUNTER — Ambulatory Visit (INDEPENDENT_AMBULATORY_CARE_PROVIDER_SITE_OTHER): Payer: Medicare HMO | Admitting: Family Medicine

## 2016-08-05 DIAGNOSIS — R000 Tachycardia, unspecified: Secondary | ICD-10-CM

## 2016-08-05 DIAGNOSIS — J111 Influenza due to unidentified influenza virus with other respiratory manifestations: Secondary | ICD-10-CM | POA: Insufficient documentation

## 2016-08-05 MED ORDER — GUAIFENESIN-CODEINE 100-10 MG/5ML PO SYRP: 5.0000 mL | ORAL_SOLUTION | Freq: Every evening | ORAL | 0 refills | Status: DC | PRN

## 2016-08-05 MED ORDER — OSELTAMIVIR PHOSPHATE 75 MG PO CAPS: 75.0000 mg | ORAL_CAPSULE | Freq: Two times a day (BID) | ORAL | 0 refills | Status: DC

## 2016-08-05 NOTE — Progress Notes (Signed)
Subjective:    Patient ID: Brian Todd, male    DOB: Sep 06, 1945, 71 y.o.   MRN: [id number]  Sore Throat   This is a new problem. The current episode started in the past 7 days ( 3 days). The problem has been rapidly worsening. The maximum temperature recorded prior to his arrival was 100.4 - 100.9 F. Associated symptoms include coughing and headaches. Pertinent negatives include no ear pain, shortness of breath or vomiting. Associated symptoms comments: Some nausea, mild. He has tried acetaminophen for the symptoms. The treatment provided mild relief.  Cough  This is a new problem. The current episode started in the past 7 days. The problem has been rapidly worsening. The cough is non-productive. Associated symptoms include chills, headaches, myalgias, nasal congestion and a sore throat. Pertinent negatives include no ear pain, postnasal drip, shortness of breath or wheezing. Associated symptoms comments: No ear pain, no face pain. Risk factors: nonsmoker. He has tried nothing for the symptoms. There is no history of asthma, bronchiectasis, bronchitis, COPD, emphysema, environmental allergies or pneumonia.  Headache   Associated symptoms include coughing and a sore throat. Pertinent negatives include no ear pain or vomiting.    PMH: HTN, CAD with 2 stents  no chronic issues  Review of Systems  Constitutional: Positive for chills.  HENT: Positive for sore throat. Negative for ear pain and postnasal drip.   Respiratory: Positive for cough. Negative for shortness of breath and wheezing.   Gastrointestinal: Negative for vomiting.  Musculoskeletal: Positive for myalgias.  Allergic/Immunologic: Negative for environmental allergies.  Neurological: Positive for headaches.       Objective:   Physical Exam  Constitutional: Vital signs are normal. He appears well-developed and well-nourished.  Non-toxic appearance. He does not appear ill. No distress.  HENT:  Head: Normocephalic and  atraumatic.  Right Ear: Hearing, tympanic membrane, external ear and ear canal normal. No tenderness. No foreign bodies. Tympanic membrane is not retracted and not bulging.  Left Ear: Hearing, tympanic membrane, external ear and ear canal normal. No tenderness. No foreign bodies. Tympanic membrane is not retracted and not bulging.  Nose: Nose normal. No mucosal edema or rhinorrhea. Right sinus exhibits no maxillary sinus tenderness and no frontal sinus tenderness. Left sinus exhibits no maxillary sinus tenderness and no frontal sinus tenderness.  Mouth/Throat: Uvula is midline, oropharynx is clear and moist and mucous membranes are normal. Normal dentition. No dental caries. No oropharyngeal exudate or tonsillar abscesses.  Eyes: Conjunctivae, EOM and lids are normal. Pupils are equal, round, and reactive to light. Lids are everted and swept, no foreign bodies found.  Neck: Trachea normal, normal range of motion and phonation normal. Neck supple. Carotid bruit is not present. No thyroid mass and no thyromegaly present.  Cardiovascular: Regular rhythm, S1 normal, S2 normal, normal heart sounds, intact distal pulses and normal pulses.  Tachycardia present.  Exam reveals no gallop.   No murmur heard. Pulmonary/Chest: Effort normal and breath sounds normal. No respiratory distress. He has no wheezes. He has no rhonchi. He has no rales.  Abdominal: Soft. Normal appearance and bowel sounds are normal. There is no hepatosplenomegaly. There is no tenderness. There is no rebound, no guarding and no CVA tenderness. No hernia.  Neurological: He is alert. He has normal reflexes.  Skin: Skin is warm, dry and intact. No rash noted.  Psychiatric: He has a normal mood and affect. His speech is normal and behavior is normal. Judgment normal.  Assessment & Plan:

## 2016-08-05 NOTE — Patient Instructions (Addendum)
Push fluids, rest.  Tylenol for body ache and fever. Cough supressant prescription at night for cough. Mucinex plain or DM ( NOT D) durng the day for cough. Complete tamiflu 5 days. Do not return to work until 24 hour after fever resolved on no antipyretics.

## 2016-08-05 NOTE — Progress Notes (Signed)
Pre visit review using our clinic review tool, if applicable. No additional management support is needed unless otherwise documented below in the visit note. 

## 2016-08-05 NOTE — Assessment & Plan Note (Signed)
Likely due to fever.  Push fluids.  Go to ER if not resolving.

## 2016-08-05 NOTE — Assessment & Plan Note (Signed)
No flu test available. High pretest probability of flu.  Discussed symptomatic care.  Hydration, rest. Call if SOB, cough worsening or prolongued fever. Reviewed flu timeline. He has  health problems that put him at risk for complications , so we decided to use  tamiflu. Pt agreed. Discussed family prophylaxis, called in tamiflu prophylaxis for spouse. He was advised to not return to work until 24 hour after fever resolved on no antipyretics.

## 2016-09-19 ENCOUNTER — Encounter: Payer: Self-pay | Admitting: Family Medicine

## 2016-09-19 ENCOUNTER — Ambulatory Visit (INDEPENDENT_AMBULATORY_CARE_PROVIDER_SITE_OTHER): Payer: Medicare HMO | Admitting: Family Medicine

## 2016-09-19 VITALS — BP 130/60 | HR 74 | Temp 98.6°F | Ht 67.5 in | Wt 199.5 lb

## 2016-09-19 DIAGNOSIS — B9789 Other viral agents as the cause of diseases classified elsewhere: Secondary | ICD-10-CM

## 2016-09-19 DIAGNOSIS — J069 Acute upper respiratory infection, unspecified: Secondary | ICD-10-CM | POA: Diagnosis not present

## 2016-09-19 DIAGNOSIS — I10 Essential (primary) hypertension: Secondary | ICD-10-CM

## 2016-09-19 MED ORDER — FLUTICASONE PROPIONATE 50 MCG/ACT NA SUSP: 2.0000 | Freq: Every day | NASAL | 6 refills | Status: DC

## 2016-09-19 NOTE — Progress Notes (Signed)
Subjective:    Patient ID: Brian Todd, male    DOB: May 06, 1946, 71 y.o.   MRN: [id number]  HPI  Here for uri symptoms  Got better from the flu in Jan  Continued cough and clearing throat on and on  ST - got really sore on sat night with worse drainage  Thick pnd  Watery nasal drainage- just a small amount - with a little blood just one time  Eyes watering  Inside of nose is burning  Ear fullness - R ear feels stopped up (he did get a lot of wax out of it with a home kit)  (he goes to ENT once per year)   No fever   Does not have a hx of seasonal allergies so far    bp is up today BP: (!) 146/70 better on 2nd check after sitting BP: 130/60     He was taking otc cold med   He had the flu back in January and was treated with tamiflu   Patient Active Problem List   Diagnosis Date Noted  . Viral URI with cough 09/19/2016  . Influenza 08/05/2016  . Tachycardia 08/05/2016  . Contact dermatitis 01/15/2016  . Routine general medical examination at a health care facility 11/17/2015  . Low back pain 07/10/2015  . Coronary artery disease involving native coronary artery without angina pectoris 04/09/2015  . Abdominal pain, left lower quadrant 04/08/2015  . Bilateral lower abdominal pain 03/31/2015  . Effort angina ([place]) 01/07/2015  . Chest pain 01/01/2015  . History of heart artery stent 01/01/2015  . Rectal bleeding 11/08/2014  . History of colitis 11/08/2014  . Encounter for Medicare annual wellness exam 10/31/2014  . DEGENERATIVE JOINT DISEASE 07/30/2009  . PSA, INCREASED 07/30/2009  . HEARING LOSS, MILD 11/27/2007  . IRRITABLE BOWEL SYNDROME 11/27/2007  . GALLBLADDER DISEASE 11/27/2007  . Hyperlipidemia 08/13/2007  . Essential hypertension 08/13/2007  . GERD 08/13/2007   Past Medical History:  Diagnosis Date  . Colitis   . Coronary artery disease   . DJD (degenerative joint disease)   . Gallbladder disease   . GERD (gastroesophageal reflux disease)   .  Hearing loss   . Hyperlipidemia   . Hypertension   . IBS (irritable bowel syndrome)   . Increased prostate specific antigen (PSA) velocity    Past Surgical History:  Procedure Laterality Date  . CARDIAC CATHETERIZATION    . CARDIAC CATHETERIZATION N/A 01/07/2015   Procedure: Left Heart Cath and Coronary Angiography;  Surgeon: [name], MD;  Location: [facility];  Service: Cardiovascular;  Laterality: N/A;  . CARDIAC CATHETERIZATION N/A 01/07/2015   Procedure: Coronary Stent Intervention;  Surgeon: [name], MD;  Location: [facility];  Service: Cardiovascular;  Laterality: N/A;  . COLONOSCOPY    . CORONARY STENT PLACEMENT     Dr. [name]  . CORONARY STENT PLACEMENT  01/07/2015   LAD  . TONSILLECTOMY AND ADENOIDECTOMY     in the 2nd grade   Social History  Substance Use Topics  . Smoking status: Never Smoker  . Smokeless tobacco: Never Used  . Alcohol use No     Comment: rare use   Family History  Problem Relation Age of Onset  . Stroke Mother    Allergies  Allergen Reactions  . Flagyl [Metronidazole]    Current Outpatient Prescriptions on File Prior to Visit  Medication Sig Dispense Refill  . aspirin EC 81 MG tablet Take 1 tablet (81  mg total) by mouth daily.    Marland Kitchen atorvastatin (LIPITOR) 40 MG tablet Take 1 tablet (40 mg total) by mouth daily. 90 tablet 3  . clopidogrel (PLAVIX) 75 MG tablet TAKE 1 TABLET(75 MG) BY MOUTH DAILY 90 tablet 3  . metoprolol (LOPRESSOR) 50 MG tablet TAKE 1/2 TABLET BY MOUTH 2 TIMES DAILY 90 tablet 3  . Multiple Vitamins-Minerals (MULTIVITAMIN & MINERAL PO) Take 1 tablet by mouth daily.    . nitroGLYCERIN (NITROSTAT) 0.4 MG SL tablet Place 1 tablet (0.4 mg total) under the tongue every 5 (five) minutes as needed for chest pain. 25 tablet 3  . Omega-3 Krill Oil 500 MG CAPS Take 1 capsule by mouth daily.    . pantoprazole (PROTONIX) 40 MG tablet TAKE 1 TABLET(40 MG) BY MOUTH TWICE DAILY BEFORE A MEAL 180 tablet 3  .  ramipril (ALTACE) 10 MG capsule Take 1 capsule (10 mg total) by mouth daily. 90 capsule 3  . Zoster Vaccine Live, PF, (ZOSTAVAX) 78295 UNT/0.65ML injection Inject 19,400 Units into the skin once. 1 each 0   No current facility-administered medications on file prior to visit.     Review of Systems  Constitutional: Positive for appetite change and fatigue. Negative for fever.  HENT: Positive for congestion, postnasal drip, rhinorrhea, sinus pressure, sneezing and sore throat. Negative for ear pain.   Eyes: Negative for pain and discharge.  Respiratory: Positive for cough. Negative for shortness of breath, wheezing and stridor.   Cardiovascular: Negative for chest pain.  Gastrointestinal: Negative for diarrhea, nausea and vomiting.  Genitourinary: Negative for frequency, hematuria and urgency.  Musculoskeletal: Negative for arthralgias and myalgias.  Skin: Negative for rash.  Neurological: Positive for headaches. Negative for dizziness, weakness and light-headedness.  Psychiatric/Behavioral: Negative for confusion and dysphoric mood.       Objective:   Physical Exam  Constitutional: He appears well-developed and well-nourished. No distress.  Well appearing   HENT:  Head: Normocephalic and atraumatic.  Right Ear: External ear normal.  Left Ear: External ear normal.  Mouth/Throat: Oropharynx is clear and moist.  Nares are injected and congested  No sinus tenderness Clear rhinorrhea and post nasal drip   Eyes: Conjunctivae and EOM are normal. Pupils are equal, round, and reactive to light. Right eye exhibits no discharge. Left eye exhibits no discharge.  Neck: Normal range of motion. Neck supple.  Cardiovascular: Normal rate and normal heart sounds.   Pulmonary/Chest: Effort normal and breath sounds normal. No respiratory distress. He has no wheezes. He has no rales. He exhibits no tenderness.  Lymphadenopathy:    He has no cervical adenopathy.  Neurological: He is alert.  Skin: Skin  is warm and dry. No rash noted.  Psychiatric: He has a normal mood and affect.          Assessment & Plan:   Problem List Items Addressed This Visit      Cardiovascular and Mediastinum   Essential hypertension - Primary    bp in fair control at this time  Better on 2nd check today BP Readings from Last 1 Encounters:  09/19/16 130/60   No changes needed Disc lifstyle change with low sodium diet and exercise          Respiratory   Viral URI with cough    Suspect post viral symptoms from the flu or new uri  Disc symptomatic care - see instructions on AVS  flonase and zyrtec  Also disc poss of seasonal all rhinitis Update if not starting to  improve in a week or if worsening  -esp if fever or facial pain

## 2016-09-19 NOTE — Patient Instructions (Addendum)
Drink lots of fluids  Use the flonase as directed for at least 2 weeks Buy over the zyrtec 10 mg and take 1 pill each evening for at least 2 weeks  Breathe steam for congestion  Get extra rest  Watch out for fever or sinus pain  Update if not starting to improve in a week or if worsening       Upper Respiratory Infection, Adult Most upper respiratory infections (URIs) are a viral infection of the air passages leading to the lungs. A URI affects the nose, throat, and upper air passages. The most common type of URI is nasopharyngitis and is typically referred to as "the common cold." URIs run their course and usually go away on their own. Most of the time, a URI does not require medical attention, but sometimes a bacterial infection in the upper airways can follow a viral infection. This is called a secondary infection. Sinus and middle ear infections are common types of secondary upper respiratory infections. Bacterial pneumonia can also complicate a URI. A URI can worsen asthma and chronic obstructive pulmonary disease (COPD). Sometimes, these complications can require emergency medical care and may be life threatening. What are the causes? Almost all URIs are caused by viruses. A virus is a type of germ and can spread from one person to another. What increases the risk? You may be at risk for a URI if:  You smoke.  You have chronic heart or lung disease.  You have a weakened defense (immune) system.  You are very young or very old.  You have nasal allergies or asthma.  You work in crowded or poorly ventilated areas.  You work in health care facilities or schools. What are the signs or symptoms? Symptoms typically develop 2-3 days after you come in contact with a cold virus. Most viral URIs last 7-10 days. However, viral URIs from the influenza virus (flu virus) can last 14-18 days and are typically more severe. Symptoms may include:  Runny or stuffy (congested)  nose.  Sneezing.  Cough.  Sore throat.  Headache.  Fatigue.  Fever.  Loss of appetite.  Pain in your forehead, behind your eyes, and over your cheekbones (sinus pain).  Muscle aches. How is this diagnosed? Your health care provider may diagnose a URI by:  Physical exam.  Tests to check that your symptoms are not due to another condition such as:  Strep throat.  Sinusitis.  Pneumonia.  Asthma. How is this treated? A URI goes away on its own with time. It cannot be cured with medicines, but medicines may be prescribed or recommended to relieve symptoms. Medicines may help:  Reduce your fever.  Reduce your cough.  Relieve nasal congestion. Follow these instructions at home:  Take medicines only as directed by your health care provider.  Gargle warm saltwater or take cough drops to comfort your throat as directed by your health care provider.  Use a warm mist humidifier or inhale steam from a shower to increase air moisture. This may make it easier to breathe.  Drink enough fluid to keep your urine clear or pale yellow.  Eat soups and other clear broths and maintain good nutrition.  Rest as needed.  Return to work when your temperature has returned to normal or as your health care provider advises. You may need to stay home longer to avoid infecting others. You can also use a face mask and careful hand washing to prevent spread of the virus.  Increase the usage  of your inhaler if you have asthma.  Do not use any tobacco products, including cigarettes, chewing tobacco, or electronic cigarettes. If you need help quitting, ask your health care provider. How is this prevented? The best way to protect yourself from getting a cold is to practice good hygiene.  Avoid oral or hand contact with people with cold symptoms.  Wash your hands often if contact occurs. There is no clear evidence that vitamin C, vitamin E, echinacea, or exercise reduces the chance of  developing a cold. However, it is always recommended to get plenty of rest, exercise, and practice good nutrition. Contact a health care provider if:  You are getting worse rather than better.  Your symptoms are not controlled by medicine.  You have chills.  You have worsening shortness of breath.  You have brown or red mucus.  You have yellow or brown nasal discharge.  You have pain in your face, especially when you bend forward.  You have a fever.  You have swollen neck glands.  You have pain while swallowing.  You have white areas in the back of your throat. Get help right away if:  You have severe or persistent:  Headache.  Ear pain.  Sinus pain.  Chest pain.  You have chronic lung disease and any of the following:  Wheezing.  Prolonged cough.  Coughing up blood.  A change in your usual mucus.  You have a stiff neck.  You have changes in your:  Vision.  Hearing.  Thinking.  Mood. This information is not intended to replace advice given to you by your health care provider. Make sure you discuss any questions you have with your health care provider. Document Released: 01/04/2001 Document Revised: 03/13/2016 Document Reviewed: 10/16/2013 Elsevier Interactive Patient Education  2017 [employer].

## 2016-09-19 NOTE — Progress Notes (Signed)
Pre visit review using our clinic review tool, if applicable. No additional management support is needed unless otherwise documented below in the visit note. 

## 2016-09-19 NOTE — Assessment & Plan Note (Signed)
Suspect post viral symptoms from the flu or new uri  Disc symptomatic care - see instructions on AVS  flonase and zyrtec  Also disc poss of seasonal all rhinitis Update if not starting to improve in a week or if worsening  -esp if fever or facial pain

## 2016-09-19 NOTE — Assessment & Plan Note (Signed)
bp in fair control at this time  Better on 2nd check today BP Readings from Last 1 Encounters:  09/19/16 130/60   No changes needed Disc lifstyle change with low sodium diet and exercise

## 2016-10-05 ENCOUNTER — Ambulatory Visit (INDEPENDENT_AMBULATORY_CARE_PROVIDER_SITE_OTHER): Payer: Medicare HMO | Admitting: Family Medicine

## 2016-10-05 ENCOUNTER — Encounter: Payer: Self-pay | Admitting: Family Medicine

## 2016-10-05 VITALS — BP 122/72 | HR 61 | Temp 97.8°F | Wt 198.0 lb

## 2016-10-05 DIAGNOSIS — F458 Other somatoform disorders: Secondary | ICD-10-CM

## 2016-10-05 DIAGNOSIS — R0989 Other specified symptoms and signs involving the circulatory and respiratory systems: Secondary | ICD-10-CM | POA: Insufficient documentation

## 2016-10-05 DIAGNOSIS — R198 Other specified symptoms and signs involving the digestive system and abdomen: Secondary | ICD-10-CM | POA: Insufficient documentation

## 2016-10-05 DIAGNOSIS — R69 Illness, unspecified: Secondary | ICD-10-CM | POA: Diagnosis not present

## 2016-10-05 DIAGNOSIS — H6981 Other specified disorders of Eustachian tube, right ear: Secondary | ICD-10-CM

## 2016-10-05 DIAGNOSIS — H6990 Unspecified Eustachian tube disorder, unspecified ear: Secondary | ICD-10-CM | POA: Insufficient documentation

## 2016-10-05 MED ORDER — PREDNISONE 10 MG PO TABS: ORAL_TABLET | ORAL | 0 refills | Status: DC

## 2016-10-05 NOTE — Patient Instructions (Signed)
Drink lots of fluids To help loosen congestion - mucinex is fine  If your acid reflux worsens -pay attention to diet/etc  Take the prednisone as directed-this is to open up your sinus system and hopefully help your ear Follow up with ENT as planned    If symptoms suddenly worsen or you get ear pain or fever let us know

## 2016-10-05 NOTE — Progress Notes (Signed)
Subjective:    Patient ID: Brian Todd, male    DOB: 07-10-1946, 71 y.o.   MRN: [id number]  HPI  Here for ear problems   Had a bad uri  Got better and then worse again  No fever  Some sinus pressure - mild / better now  Nasal d/c =nothing coming out     Right ear-cannot hear well  Has not removed any more wax from it   Not feeling good  Still some pnd  Still feels like he has something stuck in his throat "like a scab coming loose" Then felt raw in his throat   occ gerd symptoms (protonix)  occ breaks through -not regulary    Has appt for ENT 10/24/16 No pain  Using flonase - 2 weeks/just stopped (helped congestion but not ear)  Took zyrtec for 2 wk also   Recently got mucinex    Traveling this weekend   Patient Active Problem List   Diagnosis Date Noted  . ETD (Eustachian tube dysfunction), right 10/05/2016  . Globus sensation 10/05/2016  . Tachycardia 08/05/2016  . Contact dermatitis 01/15/2016  . Routine general medical examination at a health care facility 11/17/2015  . Low back pain 07/10/2015  . Coronary artery disease involving native coronary artery without angina pectoris 04/09/2015  . Abdominal pain, left lower quadrant 04/08/2015  . Bilateral lower abdominal pain 03/31/2015  . [place]ort angina ([place]) 01/07/2015  . Chest pain 01/01/2015  . History of heart artery stent 01/01/2015  . Rectal bleeding 11/08/2014  . History of colitis 11/08/2014  . Encounter for Medicare annual wellness exam 10/31/2014  . DEGENERATIVE JOINT DISEASE 07/30/2009  . PSA, INCREASED 07/30/2009  . HEARING LOSS, MILD 11/27/2007  . IRRITABLE BOWEL SYNDROME 11/27/2007  . GALLBLADDER DISEASE 11/27/2007  . Hyperlipidemia 08/13/2007  . Essential hypertension 08/13/2007  . GERD 08/13/2007   Past Medical History:  Diagnosis Date  . Colitis   . Coronary artery disease   . DJD (degenerative joint disease)   . Gallbladder disease   . GERD (gastroesophageal reflux disease)     . Hearing loss   . Hyperlipidemia   . Hypertension   . IBS (irritable bowel syndrome)   . Increased prostate specific antigen (PSA) velocity    Past Surgical History:  Procedure Laterality Date  . CARDIAC CATHETERIZATION    . CARDIAC CATHETERIZATION N/A 01/07/2015   Procedure: Left Heart Cath and Coronary Angiography;  Surgeon: [name], MD;  Location: [facility];  Service: Cardiovascular;  Laterality: N/A;  . CARDIAC CATHETERIZATION N/A 01/07/2015   Procedure: Coronary Stent Intervention;  Surgeon: [name], MD;  Location: [facility];  Service: Cardiovascular;  Laterality: N/A;  . COLONOSCOPY    . CORONARY STENT PLACEMENT     Dr. [name]  . CORONARY STENT PLACEMENT  01/07/2015   LAD  . TONSILLECTOMY AND ADENOIDECTOMY     in the 2nd grade   Social History  Substance Use Topics  . Smoking status: Never Smoker  . Smokeless tobacco: Never Used  . Alcohol use No     Comment: rare use   Family History  Problem Relation Age of Onset  . Stroke Mother    Allergies  Allergen Reactions  . Flagyl [Metronidazole]    Current Outpatient Prescriptions on File Prior to Visit  Medication Sig Dispense Refill  . aspirin EC 81 MG tablet Take 1 tablet (81 mg total) by mouth daily.    Marland Kitchen atorvastatin (LIPITOR) 40  MG tablet Take 1 tablet (40 mg total) by mouth daily. 90 tablet 3  . clopidogrel (PLAVIX) 75 MG tablet TAKE 1 TABLET(75 MG) BY MOUTH DAILY 90 tablet 3  . fluticasone (FLONASE) 50 MCG/ACT nasal spray Place 2 sprays into both nostrils daily. 16 g 6  . metoprolol (LOPRESSOR) 50 MG tablet TAKE 1/2 TABLET BY MOUTH 2 TIMES DAILY 90 tablet 3  . Multiple Vitamins-Minerals (MULTIVITAMIN & MINERAL PO) Take 1 tablet by mouth daily.    . nitroGLYCERIN (NITROSTAT) 0.4 MG SL tablet Place 1 tablet (0.4 mg total) under the tongue every 5 (five) minutes as needed for chest pain. 25 tablet 3  . Omega-3 Krill Oil 500 MG CAPS Take 1 capsule by mouth daily.    . pantoprazole  (PROTONIX) 40 MG tablet TAKE 1 TABLET(40 MG) BY MOUTH TWICE DAILY BEFORE A MEAL 180 tablet 3  . ramipril (ALTACE) 10 MG capsule Take 1 capsule (10 mg total) by mouth daily. 90 capsule 3  . Zoster Vaccine Live, PF, (ZOSTAVAX) 37902 UNT/0.65ML injection Inject 19,400 Units into the skin once. 1 each 0   No current facility-administered medications on file prior to visit.     Review of Systems    Review of Systems  Constitutional: Negative for fever, appetite change, fatigue and unexpected weight change.  Eyes: Negative for pain and visual disturbance.  ENT pos for pnd and globus sensation and R ear discomfort with reduced hearing  Respiratory: Negative for cough and shortness of breath.   Cardiovascular: Negative for cp or palpitations    Gastrointestinal: Negative for nausea, diarrhea and constipation.  Genitourinary: Negative for urgency and frequency.  Skin: Negative for pallor or rash   Neurological: Negative for weakness, light-headedness, numbness and headaches.  Hematological: Negative for adenopathy. Does not bruise/bleed easily.  Psychiatric/Behavioral: Negative for dysphoric mood. The patient is not nervous/anxious.      Objective:   Physical Exam  Constitutional: He appears well-developed and well-nourished. No distress.  Well appearing   HENT:  Head: Normocephalic and atraumatic.  Left Ear: External ear normal.  Mouth/Throat: Oropharynx is clear and moist.  Nares are boggy R nostril has less air flow and more edema  No sinus tenderness   R TM is retracted L TM nl appearing  No cerumen   Throat is clear   Eyes: Conjunctivae and EOM are normal. Pupils are equal, round, and reactive to light. Right eye exhibits no discharge. Left eye exhibits no discharge. No scleral icterus.  Neck: Normal range of motion. Neck supple. No JVD present. No thyromegaly present.  Cardiovascular: Normal rate, regular rhythm and normal heart sounds.   Pulmonary/Chest: Effort normal and  breath sounds normal. No respiratory distress. He has no wheezes. He has no rales.  Lymphadenopathy:    He has no cervical adenopathy.  Neurological: He is alert. He has normal reflexes. No cranial nerve deficit.  Skin: Skin is warm and dry. No rash noted. No erythema.  Psychiatric: He has a normal mood and affect.          Assessment & Plan:   Problem List Items Addressed This Visit      Nervous and Auditory   ETD (Eustachian tube dysfunction), right    Pt c/o of R ear fullness-canal is clear and his TM is retracted Sp several uris  No imp with 2 wk of flonase No facial pain or other symptoms   Will try prednisone 30 mg taper to see if this helps f/u with ENT early  April as planned for further eval  Update if worse or any ha/fever or dizziness        Other   Globus sensation    With pnd Pt will address with ENT Also hx of some heartburn-but well controlled with protonix

## 2016-10-06 NOTE — Assessment & Plan Note (Signed)
Pt c/o of R ear fullness-canal is clear and his TM is retracted Sp several uris  No imp with 2 wk of flonase No facial pain or other symptoms   Will try prednisone 30 mg taper to see if this helps f/u with ENT early April as planned for further eval  Update if worse or any ha/fever or dizziness

## 2016-10-06 NOTE — Assessment & Plan Note (Signed)
With pnd Pt will address with ENT Also hx of some heartburn-but well controlled with protonix

## 2016-10-11 DIAGNOSIS — R69 Illness, unspecified: Secondary | ICD-10-CM | POA: Diagnosis not present

## 2016-10-24 DIAGNOSIS — H9311 Tinnitus, right ear: Secondary | ICD-10-CM | POA: Diagnosis not present

## 2016-10-24 DIAGNOSIS — H6123 Impacted cerumen, bilateral: Secondary | ICD-10-CM | POA: Diagnosis not present

## 2016-10-24 DIAGNOSIS — H903 Sensorineural hearing loss, bilateral: Secondary | ICD-10-CM | POA: Diagnosis not present

## 2016-10-24 DIAGNOSIS — H6521 Chronic serous otitis media, right ear: Secondary | ICD-10-CM | POA: Diagnosis not present

## 2016-11-14 ENCOUNTER — Encounter: Payer: Self-pay | Admitting: Family Medicine

## 2016-11-14 ENCOUNTER — Ambulatory Visit (INDEPENDENT_AMBULATORY_CARE_PROVIDER_SITE_OTHER): Payer: Medicare HMO | Admitting: Family Medicine

## 2016-11-14 ENCOUNTER — Ambulatory Visit (INDEPENDENT_AMBULATORY_CARE_PROVIDER_SITE_OTHER)
Admission: RE | Admit: 2016-11-14 | Discharge: 2016-11-14 | Disposition: A | Discharge status: Home / Self Care | Payer: Medicare HMO | Source: Ambulatory Visit | Attending: Family Medicine | Admitting: Family Medicine

## 2016-11-14 VITALS — BP 130/70 | HR 54 | Temp 97.6°F | Ht 67.5 in | Wt 203.2 lb

## 2016-11-14 DIAGNOSIS — H6981 Other specified disorders of Eustachian tube, right ear: Secondary | ICD-10-CM | POA: Diagnosis not present

## 2016-11-14 DIAGNOSIS — M25551 Pain in right hip: Secondary | ICD-10-CM

## 2016-11-14 DIAGNOSIS — M1611 Unilateral primary osteoarthritis, right hip: Secondary | ICD-10-CM | POA: Diagnosis not present

## 2016-11-14 NOTE — Assessment & Plan Note (Signed)
Suspect bursitis based on exam Also tight hamstrings and hip flexor musculature  Xray of hip today  Rev LS films from 2016 Recommend ice 10 minutes when able to tender area  Walking if tolerated Handout with hip stretches given  May consider PT if nl results

## 2016-11-14 NOTE — Assessment & Plan Note (Signed)
Gradually improving  Seeing ENT Using valsalva maneuver to clear ears  For f/u next mo

## 2016-11-14 NOTE — Progress Notes (Signed)
Subjective:    Patient ID: Brian Todd, male    DOB: 08/25/1945, 71 y.o.   MRN: [id number]  HPI Here for R hip pain (outside of hip), occ rad to groin and ant leg  occ in R lower back/buttock  Started hurting about 2 weeks ago  No changes in his walking or activity or injury  Uses stationary bike- (better than walking)  Worse to stand  Worse to step up  Better to sit down  Sometimes hurts when walking  Goes down his leg - front of quad area   Aches slightly when lying in bed -not a lot  Will change position  More comfortable to lie on other side   No numbness and no foot drop   Put heat on it- helped some    Had some pain in his R shoulder a while ago -wondered if it was from his statin but it went away in 1-2 weeks    Ear is slowly improving  Has ENT f/u   Patient Active Problem List   Diagnosis Date Noted  . Right hip pain 11/14/2016  . ETD (Eustachian tube dysfunction), right 10/05/2016  . Globus sensation 10/05/2016  . Tachycardia 08/05/2016  . Contact dermatitis 01/15/2016  . Routine general medical examination at a health care facility 11/17/2015  . Low back pain 07/10/2015  . Coronary artery disease involving native coronary artery without angina pectoris 04/09/2015  . Chest pain 01/01/2015  . History of heart artery stent 01/01/2015  . Rectal bleeding 11/08/2014  . History of colitis 11/08/2014  . Encounter for Medicare annual wellness exam 10/31/2014  . DEGENERATIVE JOINT DISEASE 07/30/2009  . PSA, INCREASED 07/30/2009  . HEARING LOSS, MILD 11/27/2007  . IRRITABLE BOWEL SYNDROME 11/27/2007  . GALLBLADDER DISEASE 11/27/2007  . Hyperlipidemia 08/13/2007  . Essential hypertension 08/13/2007  . GERD 08/13/2007   Past Medical History:  Diagnosis Date  . Colitis   . Coronary artery disease   . DJD (degenerative joint disease)   . Gallbladder disease   . GERD (gastroesophageal reflux disease)   . Hearing loss   . Hyperlipidemia   . Hypertension    . IBS (irritable bowel syndrome)   . Increased prostate specific antigen (PSA) velocity    Past Surgical History:  Procedure Laterality Date  . CARDIAC CATHETERIZATION    . CARDIAC CATHETERIZATION N/A 01/07/2015   Procedure: Left Heart Cath and Coronary Angiography;  Surgeon: [name], MD;  Location: [facility];  Service: Cardiovascular;  Laterality: N/A;  . CARDIAC CATHETERIZATION N/A 01/07/2015   Procedure: Coronary Stent Intervention;  Surgeon: [name], MD;  Location: [facility];  Service: Cardiovascular;  Laterality: N/A;  . COLONOSCOPY    . CORONARY STENT PLACEMENT     Dr. [name]  . CORONARY STENT PLACEMENT  01/07/2015   LAD  . TONSILLECTOMY AND ADENOIDECTOMY     in the 2nd grade   Social History  Substance Use Topics  . Smoking status: Never Smoker  . Smokeless tobacco: Never Used  . Alcohol use No     Comment: rare use   Family History  Problem Relation Age of Onset  . Stroke Mother    Allergies  Allergen Reactions  . Flagyl [Metronidazole]    Current Outpatient Prescriptions on File Prior to Visit  Medication Sig Dispense Refill  . aspirin EC 81 MG tablet Take 1 tablet (81 mg total) by mouth daily.    Marland Kitchen atorvastatin (LIPITOR) 40 MG  tablet Take 1 tablet (40 mg total) by mouth daily. 90 tablet 3  . clopidogrel (PLAVIX) 75 MG tablet TAKE 1 TABLET(75 MG) BY MOUTH DAILY 90 tablet 3  . metoprolol (LOPRESSOR) 50 MG tablet TAKE 1/2 TABLET BY MOUTH 2 TIMES DAILY 90 tablet 3  . Multiple Vitamins-Minerals (MULTIVITAMIN & MINERAL PO) Take 1 tablet by mouth daily.    . nitroGLYCERIN (NITROSTAT) 0.4 MG SL tablet Place 1 tablet (0.4 mg total) under the tongue every 5 (five) minutes as needed for chest pain. 25 tablet 3  . Omega-3 Krill Oil 500 MG CAPS Take 1 capsule by mouth daily.    . pantoprazole (PROTONIX) 40 MG tablet TAKE 1 TABLET(40 MG) BY MOUTH TWICE DAILY BEFORE A MEAL 180 tablet 3  . ramipril (ALTACE) 10 MG capsule Take 1 capsule (10 mg  total) by mouth daily. 90 capsule 3  . Zoster Vaccine Live, PF, (ZOSTAVAX) 45809 UNT/0.65ML injection Inject 19,400 Units into the skin once. 1 each 0   No current facility-administered medications on file prior to visit.      Review of Systems Review of Systems  Constitutional: Negative for fever, appetite change, fatigue and unexpected weight change.  Eyes: Negative for pain and visual disturbance.  Respiratory: Negative for cough and shortness of breath.   Cardiovascular: Negative for cp or palpitations    Gastrointestinal: Negative for nausea, diarrhea and constipation.  Genitourinary: Negative for urgency and frequency.  Skin: Negative for pallor or rash   MSK pos for R hip and leg pain  Neurological: Negative for weakness, light-headedness, numbness and headaches.  Hematological: Negative for adenopathy. Does not bruise/bleed easily.  Psychiatric/Behavioral: Negative for dysphoric mood. The patient is not nervous/anxious.         Objective:   Physical Exam  Constitutional: He appears well-developed and well-nourished. No distress.  HENT:  Head: Normocephalic and atraumatic.  Neck: Normal range of motion. Neck supple.  Cardiovascular: Normal rate and regular rhythm.   Pulmonary/Chest: Effort normal and breath sounds normal.  Abdominal: Soft. Bowel sounds are normal. He exhibits no distension. There is no tenderness.  Musculoskeletal: He exhibits tenderness. He exhibits no edema or deformity.       Right hip: He exhibits tenderness and bony tenderness. He exhibits normal range of motion, normal strength, no crepitus and no deformity.  R hip : Nl rom with some discomfort on full internal rotation  Tender over greater trochanter No swelling or bruising or skin change  Tight hamstrings and hip flexors   No LS tenderness Nl rom of LS  Nl gait No neuro changes     Neurological: He displays no atrophy. No sensory deficit. He exhibits normal muscle tone. Coordination and  gait normal.  No focal neuro findings   Skin: No rash noted. No erythema.  Psychiatric: He has a normal mood and affect.          Assessment & Plan:   Problem List Items Addressed This Visit      Nervous and Auditory   ETD (Eustachian tube dysfunction), right    Gradually improving  Seeing ENT Using valsalva maneuver to clear ears  For f/u next mo         Other   Right hip pain    Suspect bursitis based on exam Also tight hamstrings and hip flexor musculature  Xray of hip today  Rev LS films from 2016 Recommend ice 10 minutes when able to tender area  Walking if tolerated Handout with hip stretches given  May consider PT if nl results       Relevant Orders   DG HIP UNILAT WITH PELVIS 2-3 VIEWS RIGHT (Completed)

## 2016-11-14 NOTE — Patient Instructions (Addendum)
Xray now of hip  Use cold compress on area that hurts- 10 minutes when you can  Start doing some stretching  Tylenol is ok to try if it helps   We will get back to you with results and plan

## 2016-11-14 NOTE — Progress Notes (Signed)
Pre visit review using our clinic review tool, if applicable. No additional management support is needed unless otherwise documented below in the visit note. 

## 2016-11-15 ENCOUNTER — Telehealth: Payer: Self-pay | Admitting: Family Medicine

## 2016-11-15 DIAGNOSIS — M25551 Pain in right hip: Secondary | ICD-10-CM

## 2016-11-15 NOTE — Telephone Encounter (Signed)
Ref done  Will route to [facility] 

## 2016-11-15 NOTE — Telephone Encounter (Signed)
-----   Message from Modena Nunnery, [place] sent at 11/15/2016  9:04 AM EDT ----- Spoke to pt who states he is agreeable to referral for PT. He is wanting to confirm that this service is covered by his insurance before being seen

## 2016-11-21 DIAGNOSIS — M7071 Other bursitis of hip, right hip: Secondary | ICD-10-CM | POA: Diagnosis not present

## 2016-11-21 DIAGNOSIS — M25551 Pain in right hip: Secondary | ICD-10-CM | POA: Diagnosis not present

## 2016-11-21 DIAGNOSIS — M6281 Muscle weakness (generalized): Secondary | ICD-10-CM | POA: Diagnosis not present

## 2016-11-24 DIAGNOSIS — M25551 Pain in right hip: Secondary | ICD-10-CM | POA: Diagnosis not present

## 2016-11-24 DIAGNOSIS — M6281 Muscle weakness (generalized): Secondary | ICD-10-CM | POA: Diagnosis not present

## 2016-11-24 DIAGNOSIS — M7071 Other bursitis of hip, right hip: Secondary | ICD-10-CM | POA: Diagnosis not present

## 2016-11-26 ENCOUNTER — Telehealth: Payer: Self-pay | Admitting: Family Medicine

## 2016-11-26 DIAGNOSIS — Z0000 Encounter for general adult medical examination without abnormal findings: Secondary | ICD-10-CM

## 2016-11-26 DIAGNOSIS — Z125 Encounter for screening for malignant neoplasm of prostate: Secondary | ICD-10-CM | POA: Insufficient documentation

## 2016-11-26 NOTE — Telephone Encounter (Signed)
-----   Message from Marchia Bond sent at 11/21/2016  1:15 PM EDT ----- Regarding: Cpx labs Thurs 5/10, need orders. Thanks! :-) Please order  future cpx labs for pt's upcoming lab appt. Thanks Aniceto Boss

## 2016-11-28 DIAGNOSIS — M25551 Pain in right hip: Secondary | ICD-10-CM | POA: Diagnosis not present

## 2016-11-28 DIAGNOSIS — M7071 Other bursitis of hip, right hip: Secondary | ICD-10-CM | POA: Diagnosis not present

## 2016-11-28 DIAGNOSIS — M6281 Muscle weakness (generalized): Secondary | ICD-10-CM | POA: Diagnosis not present

## 2016-11-30 DIAGNOSIS — M6281 Muscle weakness (generalized): Secondary | ICD-10-CM | POA: Diagnosis not present

## 2016-11-30 DIAGNOSIS — M7071 Other bursitis of hip, right hip: Secondary | ICD-10-CM | POA: Diagnosis not present

## 2016-11-30 DIAGNOSIS — M25551 Pain in right hip: Secondary | ICD-10-CM | POA: Diagnosis not present

## 2016-12-01 ENCOUNTER — Ambulatory Visit (INDEPENDENT_AMBULATORY_CARE_PROVIDER_SITE_OTHER): Payer: Medicare HMO

## 2016-12-01 ENCOUNTER — Encounter: Payer: Self-pay | Admitting: Cardiovascular Disease

## 2016-12-01 ENCOUNTER — Other Ambulatory Visit: Payer: Medicare HMO

## 2016-12-01 ENCOUNTER — Ambulatory Visit (INDEPENDENT_AMBULATORY_CARE_PROVIDER_SITE_OTHER): Payer: Medicare HMO | Admitting: Cardiovascular Disease

## 2016-12-01 VITALS — BP 118/64 | HR 54 | Temp 97.8°F | Ht 67.5 in | Wt 195.5 lb

## 2016-12-01 VITALS — BP 132/78 | HR 52 | Ht 68.0 in | Wt 198.2 lb

## 2016-12-01 DIAGNOSIS — Z125 Encounter for screening for malignant neoplasm of prostate: Secondary | ICD-10-CM | POA: Diagnosis not present

## 2016-12-01 DIAGNOSIS — E785 Hyperlipidemia, unspecified: Secondary | ICD-10-CM | POA: Diagnosis not present

## 2016-12-01 DIAGNOSIS — I10 Essential (primary) hypertension: Secondary | ICD-10-CM

## 2016-12-01 DIAGNOSIS — I2510 Atherosclerotic heart disease of native coronary artery without angina pectoris: Secondary | ICD-10-CM | POA: Diagnosis not present

## 2016-12-01 DIAGNOSIS — Z0000 Encounter for general adult medical examination without abnormal findings: Secondary | ICD-10-CM

## 2016-12-01 LAB — CBC WITH DIFFERENTIAL/PLATELET
BASOS ABS: 0 10*3/uL (ref 0.0–0.1)
Basophils Relative: 0.6 % (ref 0.0–3.0)
EOS ABS: 0.1 10*3/uL (ref 0.0–0.7)
Eosinophils Relative: 1.3 % (ref 0.0–5.0)
HEMATOCRIT: 44 % (ref 39.0–52.0)
HEMOGLOBIN: 14.7 g/dL (ref 13.0–17.0)
LYMPHS PCT: 25.3 % (ref 12.0–46.0)
Lymphs Abs: 2 10*3/uL (ref 0.7–4.0)
MCHC: 33.4 g/dL (ref 30.0–36.0)
MCV: 92.2 fl (ref 78.0–100.0)
Monocytes Absolute: 0.7 10*3/uL (ref 0.1–1.0)
Monocytes Relative: 9.1 % (ref 3.0–12.0)
Neutro Abs: 5 10*3/uL (ref 1.4–7.7)
Neutrophils Relative %: 63.7 % (ref 43.0–77.0)
Platelets: 212 10*3/uL (ref 150.0–400.0)
RBC: 4.77 Mil/uL (ref 4.22–5.81)
RDW: 13.4 % (ref 11.5–15.5)
WBC: 7.8 10*3/uL (ref 4.0–10.5)

## 2016-12-01 LAB — COMPREHENSIVE METABOLIC PANEL
ALBUMIN: 4.3 g/dL (ref 3.5–5.2)
ALT: 32 U/L (ref 0–53)
AST: 35 U/L (ref 0–37)
Alkaline Phosphatase: 78 U/L (ref 39–117)
BILIRUBIN TOTAL: 1.2 mg/dL (ref 0.2–1.2)
BUN: 15 mg/dL (ref 6–23)
CALCIUM: 9.8 mg/dL (ref 8.4–10.5)
CO2: 31 mEq/L (ref 19–32)
CREATININE: 1.07 mg/dL (ref 0.40–1.50)
Chloride: 104 mEq/L (ref 96–112)
GFR: 72.81 mL/min (ref >60.00)
Glucose, Bld: 87 mg/dL (ref 70–99)
Potassium: 5.4 mEq/L — ABNORMAL HIGH (ref 3.5–5.1)
Sodium: 140 mEq/L (ref 135–145)
TOTAL PROTEIN: 6.6 g/dL (ref 6.0–8.3)

## 2016-12-01 LAB — LIPID PANEL
CHOL/HDL RATIO: 3
CHOLESTEROL: 110 mg/dL (ref 0–200)
HDL: 43.8 mg/dL (ref >39.00)
LDL Cholesterol: 49 mg/dL (ref 0–99)
NonHDL: 66.06
TRIGLYCERIDES: 87 mg/dL (ref 0.0–149.0)
VLDL: 17.4 mg/dL (ref 0.0–40.0)

## 2016-12-01 LAB — PSA, MEDICARE: PSA: 2.22 ng/ml (ref 0.10–4.00)

## 2016-12-01 LAB — TSH: TSH: 1.76 u[IU]/mL (ref 0.35–4.50)

## 2016-12-01 MED ORDER — NITROGLYCERIN 0.4 MG SL SUBL: 0.4000 mg | SUBLINGUAL_TABLET | SUBLINGUAL | 3 refills | Status: DC | PRN

## 2016-12-01 NOTE — Progress Notes (Signed)
Subjective:   Brian Todd is a 71 y.o. male who presents for Medicare Annual/Subsequent preventive examination.  Review of Systems:  N/A Cardiac Risk Factors include: advanced age (>3men, >52 women);dyslipidemia;hypertension;male gender;obesity (BMI >30kg/m2)     Objective:    Vitals: BP 118/64 (BP Location: Left Arm, Patient Position: Sitting, Cuff Size: Normal)   Pulse (!) 54   Temp 97.8 F (36.6 C) (Oral)   Ht 5' 7.5" (1.715 m) Comment: no shoes  Wt 195 lb 8 oz (88.7 kg)   SpO2 98%   BMI 30.17 kg/m   Body mass index is 30.17 kg/m.  Tobacco History  Smoking Status  . Never Smoker  Smokeless Tobacco  . Never Used     Counseling given: No   Past Medical History:  Diagnosis Date  . Colitis   . Coronary artery disease   . DJD (degenerative joint disease)   . Gallbladder disease   . GERD (gastroesophageal reflux disease)   . Hearing loss   . Hyperlipidemia   . Hypertension   . IBS (irritable bowel syndrome)   . Increased prostate specific antigen (PSA) velocity    Past Surgical History:  Procedure Laterality Date  . CARDIAC CATHETERIZATION    . CARDIAC CATHETERIZATION N/A 01/07/2015   Procedure: Left Heart Cath and Coronary Angiography;  Surgeon: [name], MD;  Location: [facility];  Service: Cardiovascular;  Laterality: N/A;  . CARDIAC CATHETERIZATION N/A 01/07/2015   Procedure: Coronary Stent Intervention;  Surgeon: [name], MD;  Location: [facility];  Service: Cardiovascular;  Laterality: N/A;  . COLONOSCOPY    . CORONARY STENT PLACEMENT     Dr. [name]  . CORONARY STENT PLACEMENT  01/07/2015   LAD  . TONSILLECTOMY AND ADENOIDECTOMY     in the 2nd grade   Family History  Problem Relation Age of Onset  . Stroke Mother    History  Sexual Activity  . Sexual activity: Yes    Outpatient Encounter Prescriptions as of 12/01/2016  Medication Sig  . aspirin EC 81 MG tablet Take 1 tablet (81 mg total) by mouth daily.    Marland Kitchen atorvastatin (LIPITOR) 40 MG tablet Take 1 tablet (40 mg total) by mouth daily.  . clopidogrel (PLAVIX) 75 MG tablet TAKE 1 TABLET(75 MG) BY MOUTH DAILY  . metoprolol (LOPRESSOR) 50 MG tablet TAKE 1/2 TABLET BY MOUTH 2 TIMES DAILY  . Multiple Vitamins-Minerals (MULTIVITAMIN & MINERAL PO) Take 1 tablet by mouth daily.  . nitroGLYCERIN (NITROSTAT) 0.4 MG SL tablet Place 1 tablet (0.4 mg total) under the tongue every 5 (five) minutes as needed for chest pain.  . Omega-3 Krill Oil 500 MG CAPS Take 1 capsule by mouth daily.  . pantoprazole (PROTONIX) 40 MG tablet TAKE 1 TABLET(40 MG) BY MOUTH TWICE DAILY BEFORE A MEAL (Patient taking differently: once daily)  . ramipril (ALTACE) 10 MG capsule Take 1 capsule (10 mg total) by mouth daily.  Marland Kitchen Zoster Vaccine Live, PF, (ZOSTAVAX) 95284 UNT/0.65ML injection Inject 19,400 Units into the skin once.  . [DISCONTINUED] nitroGLYCERIN (NITROSTAT) 0.4 MG SL tablet Place 1 tablet (0.4 mg total) under the tongue every 5 (five) minutes as needed for chest pain.   No facility-administered encounter medications on file as of 12/01/2016.     Activities of Daily Living In your present state of health, do you have any difficulty performing the following activities: 12/01/2016  Hearing? N  Vision? N  Difficulty concentrating or making decisions? N  Walking or climbing stairs? N  Dressing or bathing? N  Doing errands, shopping? N  Preparing Food and eating ? N  Using the Toilet? N  In the past six months, have you accidently leaked urine? N  Do you have problems with loss of bowel control? N  Managing your Medications? N  Managing your Finances? N  Housekeeping or managing your Housekeeping? N  Some recent data might be hidden    Patient Care Team: Tower, Wynelle Fanny, MD as PCP - General (Family Medicine) Ralene Bathe, MD as Consulting Physician (Ophthalmology) [name], MD as Consulting Physician (Cardiology)   Assessment:     Hearing Screening    125Hz  250Hz  500Hz  1000Hz  2000Hz  3000Hz  4000Hz  6000Hz  8000Hz   Right ear:   0 0 0  0    Left ear:   0 0 0  0    Vision Screening Comments: Last vision exam in 2017   Exercise Activities and Dietary recommendations Current Exercise Habits: Home exercise routine, Type of exercise: walking, Time (Minutes): 60, Frequency (Times/Week): 6, Weekly Exercise (Minutes/Week): 360, Intensity: Moderate, Exercise limited by: None identified  Goals    . Increase physical activity          Starting 12/01/16, I will continue to exercise for at least 60 min 6 days per week.       Fall Risk Fall Risk  12/01/2016 12/01/2015 11/17/2015 10/31/2014 09/16/2013  Falls in the past year? No No No No No   Depression Screen PHQ 2/9 Scores 12/01/2016 12/01/2015 11/17/2015 05/29/2015  PHQ - 2 Score 0 0 0 0    Cognitive Function MMSE - Mini Mental State Exam 12/01/2016 12/01/2015  Orientation to time 5 5  Orientation to Place 5 5  Registration 3 3  Attention/ Calculation 0 0  Recall 3 3  Language- name 2 objects 0 0  Language- repeat 1 1  Language- follow 3 step command 3 3  Language- read & follow direction 0 0  Write a sentence 0 0  Copy design 0 0  Total score 20 20       PLEASE NOTE: A Mini-Cog screen was completed. Maximum score is 20. A value of 0 denotes this part of [facility] MMSE was not completed or the patient failed this part of the Mini-Cog screening.   Mini-Cog Screening Orientation to Time - Max 5 pts Orientation to Place - Max 5 pts Registration - Max 3 pts Recall - Max 3 pts Language Repeat - Max 1 pts Language Follow 3 Step Command - Max 3 pts    Immunization History  Administered Date(s) Administered  . Influenza Split 04/16/2011, 04/26/2012  . Influenza Whole 03/25/2009, 03/25/2010  . Influenza, Seasonal, Injecte, Preservative Fre 04/28/2016  . Influenza,inj,Quad PF,36+ Mos 04/10/2013, 04/02/2014, 05/04/2015  . Pneumococcal Conjugate-13 10/31/2014  . Pneumococcal Polysaccharide-23  09/16/2013  . Td 09/02/2010   Screening Tests Health Maintenance  Topic Date Due  . INFLUENZA VACCINE  02/22/2017  . TETANUS/TDAP  09/02/2020  . COLONOSCOPY  12/08/2024  . Hepatitis C Screening  Completed  . PNA vac Low Risk Adult  Completed      Plan:      I have personally reviewed and addressed the Medicare Annual Wellness questionnaire and have noted the following in the patient's chart:  A. Medical and social history B. Use of alcohol, tobacco or illicit drugs  C. Current medications and supplements D. Functional ability and status E.  Nutritional status F.  Physical activity G. Advance directives  H. List of other physicians I.  Hospitalizations, surgeries, and ER visits in previous 12 months J.  [facility] to include hearing, vision, cognitive, depression L. Referrals and appointments - none  In addition, I have reviewed and discussed with patient certain preventive protocols, quality metrics, and best practice recommendations. A written personalized care plan for preventive services as well as general preventive health recommendations were provided to patient.  See attached scanned questionnaire for additional information.   Signed,   Lindell Noe, MHA, BS, LPN Health Coach

## 2016-12-01 NOTE — Patient Instructions (Signed)
Medication Instructions: Continue same medications.   Labwork: None.   Procedures/Testing: None.   Follow-Up: 1 year with Dr. Aniyia Rane.   Any Additional Special Instructions Will Be Listed Below (If Applicable).     If you need a refill on your cardiac medications before your next appointment, please call your pharmacy.   

## 2016-12-01 NOTE — Progress Notes (Signed)
Cardiology Office Note   Date:  12/01/2016   ID:  ATIF CHAPPLE, DOB January 19, 1946, MRN [id number]  PCP:  Abner Greenspan, MD  Cardiologist:   Kathlyn Sacramento, MD   Chief Complaint  Patient presents with  . other    12 month follow up. Meds reviewed by the pt. verbally. "doing well."       History of Present Illness: Brian Todd is a 71 y.o. male who presents for a follow-up visit regarding coronary artery disease. He had a small non-ST elevation myocardial infarction in 2001. Cardiac catheterization showed a 95% mid LAD stenosis and 40% stenosis in OM branch. He had successful angioplasty and Taxus drug-eluting stent placement to the mid LAD without complications.  He Had recurrent angina in June 2016. Cardiac catheterization showed 99% in-stent restenosis in the mid LAD extending to the distal edge, 60% mid left circumflex stenosis and mild RCA disease. I performed successful angioplasty and drug-eluting stent placement to the mid LAD.  He has other chronic medical conditions that include hypertension, hyperlipidemia and gastroesophageal reflux disease.  He has been doing very well with no recurrent angina. No chest pain or shortness of breath. He is active and exercises on a regular basis with no exertional symptoms. He is going to have labs done with his primary care physician today. He is mildly bradycardic but has no dizziness, syncope or presyncope.  Past Medical History:  Diagnosis Date  . Colitis   . Coronary artery disease   . DJD (degenerative joint disease)   . Gallbladder disease   . GERD (gastroesophageal reflux disease)   . Hearing loss   . Hyperlipidemia   . Hypertension   . IBS (irritable bowel syndrome)   . Increased prostate specific antigen (PSA) velocity     Past Surgical History:  Procedure Laterality Date  . CARDIAC CATHETERIZATION    . CARDIAC CATHETERIZATION N/A 01/07/2015   Procedure: Left Heart Cath and Coronary Angiography;  Surgeon: [name], MD;  Location: [facility];  Service: Cardiovascular;  Laterality: N/A;  . CARDIAC CATHETERIZATION N/A 01/07/2015   Procedure: Coronary Stent Intervention;  Surgeon: [name], MD;  Location: [facility];  Service: Cardiovascular;  Laterality: N/A;  . COLONOSCOPY    . CORONARY STENT PLACEMENT     Dr. [name]  . CORONARY STENT PLACEMENT  01/07/2015   LAD  . TONSILLECTOMY AND ADENOIDECTOMY     in the 2nd grade     Current Outpatient Prescriptions  Medication Sig Dispense Refill  . aspirin EC 81 MG tablet Take 1 tablet (81 mg total) by mouth daily.    Marland Kitchen atorvastatin (LIPITOR) 40 MG tablet Take 1 tablet (40 mg total) by mouth daily. 90 tablet 3  . clopidogrel (PLAVIX) 75 MG tablet TAKE 1 TABLET(75 MG) BY MOUTH DAILY 90 tablet 3  . metoprolol (LOPRESSOR) 50 MG tablet TAKE 1/2 TABLET BY MOUTH 2 TIMES DAILY 90 tablet 3  . Multiple Vitamins-Minerals (MULTIVITAMIN & MINERAL PO) Take 1 tablet by mouth daily.    . nitroGLYCERIN (NITROSTAT) 0.4 MG SL tablet Place 1 tablet (0.4 mg total) under the tongue every 5 (five) minutes as needed for chest pain. 25 tablet 3  . Omega-3 Krill Oil 500 MG CAPS Take 1 capsule by mouth daily.    . pantoprazole (PROTONIX) 40 MG tablet TAKE 1 TABLET(40 MG) BY MOUTH TWICE DAILY BEFORE A MEAL 180 tablet 3  . ramipril (ALTACE) 10 MG capsule Take  1 capsule (10 mg total) by mouth daily. 90 capsule 3  . Zoster Vaccine Live, PF, (ZOSTAVAX) 03500 UNT/0.65ML injection Inject 19,400 Units into the skin once. 1 each 0   No current facility-administered medications for this visit.     Allergies:   Flagyl [metronidazole]    Social History:  The patient  reports that he has never smoked. He has never used smokeless tobacco. He reports that he does not drink alcohol or use drugs.   Family History:  The patient's family history includes Stroke in his mother.    ROS:  Please see the history of present illness.   Otherwise, review of systems are  positive for none.   All other systems are reviewed and negative.    PHYSICAL EXAM: VS:  BP 132/78 (BP Location: Left Arm, Patient Position: Sitting, Cuff Size: Normal)   Pulse (!) 52   Ht 5\' 8"  (1.727 m)   Wt 198 lb 4 oz (89.9 kg)   BMI 30.14 kg/m  , BMI Body mass index is 30.14 kg/m. GEN: Well nourished, well developed, in no acute distress  HEENT: normal  Neck: no JVD, carotid bruits, or masses Cardiac: RRR; no murmurs, rubs, or gallops,no edema  Respiratory:  clear to auscultation bilaterally, normal work of breathing GI: soft, nontender, nondistended, + BS MS: no deformity or atrophy  Skin: warm and dry, no rash Neuro:  Strength and sensation are intact Psych: euthymic mood, full affect   EKG:  EKG is ordered today. The ekg ordered today demonstrates sinus bradycardia with no significant ST or T wave changes.   Recent Labs: No results found for requested labs within last 8760 hours.    Lipid Panel    Component Value Date/Time   CHOL 114 11/03/2015 0813   TRIG 83.0 11/03/2015 0813   HDL 44.10 11/03/2015 0813   CHOLHDL 3 11/03/2015 0813   VLDL 16.6 11/03/2015 0813   LDLCALC 53 11/03/2015 0813   LDLDIRECT 76.5 09/02/2010 1036      Wt Readings from Last 3 Encounters:  12/01/16 198 lb 4 oz (89.9 kg)  11/14/16 203 lb 4 oz (92.2 kg)  10/05/16 198 lb (89.8 kg)        ASSESSMENT AND PLAN:  1.  Coronary artery disease involving native coronary arteries without angina: The patient overall is doing well from a cardiac standpoint. Given that he has a stent within the stent, I am planning to keep him on long-term dual antiplatelet therapy as tolerated.  2. Hyperlipidemia: Continue high dose atorvastatin. He is getting lipid profile today.  3. Essential hypertension: Blood pressure is well controlled on metoprolol and ramipril.   Disposition:   FU with me in 1 year  Signed,  Kathlyn Sacramento, MD  12/01/2016 8:26 AM    [place]

## 2016-12-01 NOTE — Progress Notes (Signed)
Pre visit review using our clinic review tool, if applicable. No additional management support is needed unless otherwise documented below in the visit note. 

## 2016-12-01 NOTE — Progress Notes (Signed)
PCP notes:   Health maintenance:  No gaps identified.   Abnormal screenings:   Hearing - failed  Patient concerns:   None  Nurse concerns:  None  Next PCP appt:   12/12/16 @ 0930

## 2016-12-01 NOTE — Patient Instructions (Signed)
Brian Todd , Thank you for taking time to come for your Medicare Wellness Visit. I appreciate your ongoing commitment to your health goals. Please review the following plan we discussed and let me know if I can assist you in the future.   These are the goals we discussed: Goals    . Increase physical activity          Starting 12/01/16, I will continue to exercise for at least 60 min 6 days per week.        This is a list of the screening recommended for you and due dates:  Health Maintenance  Topic Date Due  . Flu Shot  02/22/2017  . Tetanus Vaccine  09/02/2020  . Colon Cancer Screening  12/08/2024  .  Hepatitis C: One time screening is recommended by Center for Disease Control  (CDC) for  adults born from [birth year] through 1965.   Completed  . Pneumonia vaccines  Completed   Preventive Care for Adults  A healthy lifestyle and preventive care can promote health and wellness. Preventive health guidelines for adults include the following key practices.  . A routine yearly physical is a good way to check with your health care provider about your health and preventive screening. It is a chance to share any concerns and updates on your health and to receive a thorough exam.  . Visit your dentist for a routine exam and preventive care every 6 months. Brush your teeth twice a day and floss once a day. Good oral hygiene prevents tooth decay and gum disease.  . The frequency of eye exams is based on your age, health, family medical history, use  of contact lenses, and other factors. Follow your health care provider's ecommendations for frequency of eye exams.  . Eat a healthy diet. Foods like vegetables, fruits, whole grains, low-fat dairy products, and lean protein foods contain the nutrients you need without too many calories. Decrease your intake of foods high in solid fats, added sugars, and salt. Eat the right amount of calories for you. Get information about a proper diet from your health  care provider, if necessary.  . Regular physical exercise is one of the most important things you can do for your health. Most adults should get at least 150 minutes of moderate-intensity exercise (any activity that increases your heart rate and causes you to sweat) each week. In addition, most adults need muscle-strengthening exercises on 2 or more days a week.  Silver Sneakers may be a benefit available to you. To determine eligibility, you may visit the website: www.silversneakers.com or contact program at 442-751-4571 Mon-Fri between 8AM-8PM.   . Maintain a healthy weight. The body mass index (BMI) is a screening tool to identify possible weight problems. It provides an estimate of body fat based on height and weight. Your health care provider can find your BMI and can help you achieve or maintain a healthy weight.   For adults 20 years and older: ? A BMI below 18.5 is considered underweight. ? A BMI of 18.5 to 24.9 is normal. ? A BMI of 25 to 29.9 is considered overweight. ? A BMI of 30 and above is considered obese.   . Maintain normal blood lipids and cholesterol levels by exercising and minimizing your intake of saturated fat. Eat a balanced diet with plenty of fruit and vegetables. Blood tests for lipids and cholesterol should begin at age 19 and be repeated every 5 years. If your lipid or cholesterol levels are  high, you are over 50, or you are at high risk for heart disease, you may need your cholesterol levels checked more frequently. Ongoing high lipid and cholesterol levels should be treated with medicines if diet and exercise are not working.  . If you smoke, find out from your health care provider how to quit. If you do not use tobacco, please do not start.  . If you choose to drink alcohol, please do not consume more than 2 drinks per day. One drink is considered to be 12 ounces (355 mL) of beer, 5 ounces (148 mL) of wine, or 1.5 ounces (44 mL) of liquor.  . If you are 8-54  years old, ask your health care provider if you should take aspirin to prevent strokes.  . Use sunscreen. Apply sunscreen liberally and repeatedly throughout the day. You should seek shade when your shadow is shorter than you. Protect yourself by wearing long sleeves, pants, a wide-brimmed hat, and sunglasses year round, whenever you are outdoors.  . Once a month, do a whole body skin exam, using a mirror to look at the skin on your back. Tell your health care provider of new moles, moles that have irregular borders, moles that are larger than a pencil eraser, or moles that have changed in shape or color.

## 2016-12-02 NOTE — Progress Notes (Signed)
I reviewed health advisor's note, was available for consultation, and agree with documentation and plan.  

## 2016-12-06 ENCOUNTER — Encounter: Payer: Medicare HMO | Admitting: Family Medicine

## 2016-12-12 ENCOUNTER — Ambulatory Visit (INDEPENDENT_AMBULATORY_CARE_PROVIDER_SITE_OTHER): Payer: Medicare HMO | Admitting: Family Medicine

## 2016-12-12 ENCOUNTER — Encounter: Payer: Self-pay | Admitting: Family Medicine

## 2016-12-12 VITALS — BP 122/66 | HR 63 | Temp 98.6°F | Ht 67.5 in | Wt 201.8 lb

## 2016-12-12 DIAGNOSIS — Z0000 Encounter for general adult medical examination without abnormal findings: Secondary | ICD-10-CM | POA: Diagnosis not present

## 2016-12-12 DIAGNOSIS — Z125 Encounter for screening for malignant neoplasm of prostate: Secondary | ICD-10-CM

## 2016-12-12 DIAGNOSIS — E875 Hyperkalemia: Secondary | ICD-10-CM | POA: Diagnosis not present

## 2016-12-12 DIAGNOSIS — E7800 Pure hypercholesterolemia, unspecified: Secondary | ICD-10-CM | POA: Diagnosis not present

## 2016-12-12 DIAGNOSIS — I10 Essential (primary) hypertension: Secondary | ICD-10-CM

## 2016-12-12 DIAGNOSIS — H903 Sensorineural hearing loss, bilateral: Secondary | ICD-10-CM | POA: Diagnosis not present

## 2016-12-12 DIAGNOSIS — H6981 Other specified disorders of Eustachian tube, right ear: Secondary | ICD-10-CM

## 2016-12-12 DIAGNOSIS — H9311 Tinnitus, right ear: Secondary | ICD-10-CM | POA: Diagnosis not present

## 2016-12-12 DIAGNOSIS — H6123 Impacted cerumen, bilateral: Secondary | ICD-10-CM | POA: Diagnosis not present

## 2016-12-12 DIAGNOSIS — H6521 Chronic serous otitis media, right ear: Secondary | ICD-10-CM | POA: Diagnosis not present

## 2016-12-12 LAB — POTASSIUM: Potassium: 4.5 mEq/L (ref 3.5–5.1)

## 2016-12-12 MED ORDER — PANTOPRAZOLE SODIUM 40 MG PO TBEC: 40.0000 mg | DELAYED_RELEASE_TABLET | Freq: Every day | ORAL | 3 refills | Status: DC

## 2016-12-12 MED ORDER — ATORVASTATIN CALCIUM 40 MG PO TABS: 40.0000 mg | ORAL_TABLET | Freq: Every day | ORAL | 3 refills | Status: DC

## 2016-12-12 MED ORDER — RAMIPRIL 10 MG PO CAPS: 10.0000 mg | ORAL_CAPSULE | Freq: Every day | ORAL | 3 refills | Status: DC

## 2016-12-12 MED ORDER — METOPROLOL TARTRATE 50 MG PO TABS: ORAL_TABLET | ORAL | 3 refills | Status: DC

## 2016-12-12 NOTE — Patient Instructions (Addendum)
If you are interested in the new shingles vaccine (Shingrix) - call your insurance to check on coverage,( you should not get it within 1 month of other vaccines) , then call us for a prescription  for it to take to a pharmacy that gives the shot , or make a nurse visit to get it here depending on your coverage (do not get the zostavax that I px you)   Wear your sun protection to avoid skin cancer   We will re check your potassium level today

## 2016-12-12 NOTE — Assessment & Plan Note (Signed)
In setting of CAD doing well  bp in fair control at this time  BP Readings from Last 1 Encounters:  12/12/16 122/66   No changes needed Disc lifstyle change with low sodium diet and exercise   Labs reviewed

## 2016-12-12 NOTE — Assessment & Plan Note (Signed)
Reviewed health habits including diet and exercise and skin cancer prevention Reviewed appropriate screening tests for age  Also reviewed health mt list, fam hx and immunization status , as well as social and family history   See HPI amw rev -pt sees ENT for ear discomfort and hearing loss today  Interested in shingrix if covered Labs reviewed  Disc skin cancer prevention

## 2016-12-12 NOTE — Assessment & Plan Note (Signed)
Disc goals for lipids and reasons to control them Rev labs with pt Rev low sat fat diet in detail Controlled with diet and atorvastatin

## 2016-12-12 NOTE — Assessment & Plan Note (Signed)
No symptoms or family hx  Lab Results  Component Value Date   PSA 2.22 12/01/2016   PSA 1.84 11/03/2015   PSA 1.63 05/04/2015   Continue to follow  Disc s/s of prostate enl or cancer

## 2016-12-12 NOTE — Assessment & Plan Note (Signed)
K was mildly elevated on first check  Unsure if hemolyzed-re check today  Avoiding high K foods Will hold mvi if needed  No symptoms  Currently on ace

## 2016-12-12 NOTE — Progress Notes (Signed)
Subjective:    Patient ID: Brian Todd, male    DOB: 1946/01/04, 71 y.o.   MRN: [id number]  HPI  Here for health maintenance exam and to review chronic medical problems    Feeling fine overall-doing fairly well    Had AMW 5/10  Hearing - failed screen   Wt Readings from Last 3 Encounters:  12/12/16 201 lb 12 oz (91.5 kg)  12/01/16 195 lb 8 oz (88.7 kg)  12/01/16 198 lb 4 oz (89.9 kg)  diet -gained wt on recent vacation  Exercise -usually walks 5 days per week  bmi 31.1  Tetanus shot 2/12  Colonoscopy 5/16-nl with 10 y recall   Zoster status-has not had the zostavax (was px)    Prostate health No problems  Nocturia - seldom unless he drinks a lot of water  No problems with stream  Lab Results  Component Value Date   PSA 2.22 12/01/2016   PSA 1.84 11/03/2015   PSA 1.63 05/04/2015   no family hx of prostate cancer    bp is stable today - has HTN in setting of CAD No cp or palpitations or headaches or edema  No side effects to medicines  BP Readings from Last 3 Encounters:  12/12/16 122/66  12/01/16 118/64  12/01/16 132/78      Hx of hyperlipidemia Lab Results  Component Value Date   CHOL 110 12/01/2016   CHOL 114 11/03/2015   CHOL 147 10/24/2014   Lab Results  Component Value Date   HDL 43.80 12/01/2016   HDL 44.10 11/03/2015   HDL 36.80 (L) 10/24/2014   Lab Results  Component Value Date   LDLCALC 49 12/01/2016   LDLCALC 53 11/03/2015   LDLCALC 76 10/24/2014   Lab Results  Component Value Date   TRIG 87.0 12/01/2016   TRIG 83.0 11/03/2015   TRIG 171.0 (H) 10/24/2014   Lab Results  Component Value Date   CHOLHDL 3 12/01/2016   CHOLHDL 3 11/03/2015   CHOLHDL 4 10/24/2014   Lab Results  Component Value Date   LDLDIRECT 76.5 09/02/2010   lipitor 40 and diet  Very well controlled     Chemistry      Component Value Date/Time   NA 140 12/01/2016 1237   NA 141 01/02/2015 1558   K 5.4 (H) 12/01/2016 1237   CL 104 12/01/2016 1237     CO2 31 12/01/2016 1237   BUN 15 12/01/2016 1237   BUN 15 01/02/2015 1558   CREATININE 1.07 12/01/2016 1237      Component Value Date/Time   CALCIUM 9.8 12/01/2016 1237   ALKPHOS 78 12/01/2016 1237   AST 35 12/01/2016 1237   ALT 32 12/01/2016 1237   BILITOT 1.2 12/01/2016 1237     K was high  He takes a mvi daily  Avoids potassium  Lab Results  Component Value Date   WBC 7.8 12/01/2016   HGB 14.7 12/01/2016   HCT 44.0 12/01/2016   MCV 92.2 12/01/2016   PLT 212.0 12/01/2016   Lab Results  Component Value Date   TSH 1.76 12/01/2016    Patient Active Problem List   Diagnosis Date Noted  . Hyperkalemia 12/12/2016  . Prostate cancer screening 11/26/2016  . Right hip pain 11/14/2016  . ETD (Eustachian tube dysfunction), right 10/05/2016  . Globus sensation 10/05/2016  . Tachycardia 08/05/2016  . Routine general medical examination at a health care facility 11/17/2015  . Low back pain 07/10/2015  . Coronary artery disease  involving native coronary artery without angina pectoris 04/09/2015  . Chest pain 01/01/2015  . History of heart artery stent 01/01/2015  . Rectal bleeding 11/08/2014  . History of colitis 11/08/2014  . Encounter for Medicare annual wellness exam 10/31/2014  . DEGENERATIVE JOINT DISEASE 07/30/2009  . HEARING LOSS, MILD 11/27/2007  . IRRITABLE BOWEL SYNDROME 11/27/2007  . GALLBLADDER DISEASE 11/27/2007  . Hyperlipidemia 08/13/2007  . Essential hypertension 08/13/2007  . GERD 08/13/2007   Past Medical History:  Diagnosis Date  . Colitis   . Coronary artery disease   . DJD (degenerative joint disease)   . Gallbladder disease   . GERD (gastroesophageal reflux disease)   . Hearing loss   . Hyperlipidemia   . Hypertension   . IBS (irritable bowel syndrome)   . Increased prostate specific antigen (PSA) velocity    Past Surgical History:  Procedure Laterality Date  . CARDIAC CATHETERIZATION    . CARDIAC CATHETERIZATION N/A 01/07/2015    Procedure: Left Heart Cath and Coronary Angiography;  Surgeon: [name], MD;  Location: [facility];  Service: Cardiovascular;  Laterality: N/A;  . CARDIAC CATHETERIZATION N/A 01/07/2015   Procedure: Coronary Stent Intervention;  Surgeon: [name], MD;  Location: [facility];  Service: Cardiovascular;  Laterality: N/A;  . COLONOSCOPY    . CORONARY STENT PLACEMENT     Dr. [name]  . CORONARY STENT PLACEMENT  01/07/2015   LAD  . TONSILLECTOMY AND ADENOIDECTOMY     in the 2nd grade   Social History  Substance Use Topics  . Smoking status: Never Smoker  . Smokeless tobacco: Never Used  . Alcohol use No     Comment: rare use   Family History  Problem Relation Age of Onset  . Stroke Mother    Allergies  Allergen Reactions  . Flagyl [Metronidazole]    Current Outpatient Prescriptions on File Prior to Visit  Medication Sig Dispense Refill  . aspirin EC 81 MG tablet Take 1 tablet (81 mg total) by mouth daily.    . clopidogrel (PLAVIX) 75 MG tablet TAKE 1 TABLET(75 MG) BY MOUTH DAILY 90 tablet 3  . Multiple Vitamins-Minerals (MULTIVITAMIN & MINERAL PO) Take 1 tablet by mouth daily.    . nitroGLYCERIN (NITROSTAT) 0.4 MG SL tablet Place 1 tablet (0.4 mg total) under the tongue every 5 (five) minutes as needed for chest pain. 25 tablet 3  . Omega-3 Krill Oil 500 MG CAPS Take 1 capsule by mouth daily.     No current facility-administered medications on file prior to visit.     Review of Systems     Objective:   Physical Exam  Constitutional: He appears well-developed and well-nourished. No distress.  obese and well appearing   HENT:  Head: Normocephalic and atraumatic.  Right Ear: External ear normal.  Left Ear: External ear normal.  Nose: Nose normal.  Mouth/Throat: Oropharynx is clear and moist.  Eyes: Conjunctivae and EOM are normal. Pupils are equal, round, and reactive to light. Right eye exhibits no discharge. Left eye exhibits no discharge. No  scleral icterus.  Neck: Normal range of motion. Neck supple. No JVD present. Carotid bruit is not present. No thyromegaly present.  Cardiovascular: Normal rate, regular rhythm, normal heart sounds and intact distal pulses.  Exam reveals no gallop.   Pulmonary/Chest: Effort normal and breath sounds normal. No respiratory distress. He has no wheezes. He exhibits no tenderness.  Abdominal: Soft. Bowel sounds are normal. He exhibits no distension, no  abdominal bruit and no mass. There is no tenderness.  Musculoskeletal: He exhibits no edema or tenderness.  Lymphadenopathy:    He has no cervical adenopathy.  Neurological: He is alert. He has normal reflexes. No cranial nerve deficit. He exhibits normal muscle tone. Coordination normal.  Skin: Skin is warm and dry. No rash noted. No erythema. No pallor.  seb cyst R upper back 1 cm  0.5 cm R neck  Neither look infected  Solar lentigines diffusely   Psychiatric: He has a normal mood and affect.          Assessment & Plan:   Problem List Items Addressed This Visit      Cardiovascular and Mediastinum   Essential hypertension - Primary    In setting of CAD doing well  bp in fair control at this time  BP Readings from Last 1 Encounters:  12/12/16 122/66   No changes needed Disc lifstyle change with low sodium diet and exercise   Labs reviewed       Relevant Medications   atorvastatin (LIPITOR) 40 MG tablet   metoprolol tartrate (LOPRESSOR) 50 MG tablet   ramipril (ALTACE) 10 MG capsule     Nervous and Auditory   ETD (Eustachian tube dysfunction), right    Pt has f/u with ENT today for ear discomfort and hearing loss         Other   Hyperkalemia    K was mildly elevated on first check  Unsure if hemolyzed-re check today  Avoiding high K foods Will hold mvi if needed  No symptoms  Currently on ace       Relevant Orders   Potassium (Completed)   Hyperlipidemia    Disc goals for lipids and reasons to control them Rev  labs with pt Rev low sat fat diet in detail Controlled with diet and atorvastatin       Relevant Medications   atorvastatin (LIPITOR) 40 MG tablet   metoprolol tartrate (LOPRESSOR) 50 MG tablet   ramipril (ALTACE) 10 MG capsule   Prostate cancer screening    No symptoms or family hx  Lab Results  Component Value Date   PSA 2.22 12/01/2016   PSA 1.84 11/03/2015   PSA 1.63 05/04/2015   Continue to follow  Disc s/s of prostate enl or cancer       Routine general medical examination at a health care facility    Reviewed health habits including diet and exercise and skin cancer prevention Reviewed appropriate screening tests for age  Also reviewed health mt list, fam hx and immunization status , as well as social and family history   See HPI amw rev -pt sees ENT for ear discomfort and hearing loss today  Interested in shingrix if covered Labs reviewed  Disc skin cancer prevention

## 2016-12-12 NOTE — Assessment & Plan Note (Signed)
Pt has f/u with ENT today for ear discomfort and hearing loss

## 2016-12-17 ENCOUNTER — Encounter: Payer: Self-pay | Admitting: Family Medicine

## 2016-12-17 ENCOUNTER — Ambulatory Visit (INDEPENDENT_AMBULATORY_CARE_PROVIDER_SITE_OTHER): Payer: Medicare HMO | Admitting: Family Medicine

## 2016-12-17 VITALS — BP 130/70 | HR 65 | Temp 97.6°F | Resp 12 | Ht 67.5 in | Wt 198.0 lb

## 2016-12-17 DIAGNOSIS — H60501 Unspecified acute noninfective otitis externa, right ear: Secondary | ICD-10-CM | POA: Diagnosis not present

## 2016-12-17 DIAGNOSIS — H6691 Otitis media, unspecified, right ear: Secondary | ICD-10-CM | POA: Diagnosis not present

## 2016-12-17 MED ORDER — AMOXICILLIN-POT CLAVULANATE 875-125 MG PO TABS: 1.0000 | ORAL_TABLET | Freq: Two times a day (BID) | ORAL | 0 refills | Status: AC

## 2016-12-17 MED ORDER — CIPROFLOXACIN-DEXAMETHASONE 0.3-0.1 % OT SUSP: 4.0000 [drp] | Freq: Two times a day (BID) | OTIC | 0 refills | Status: AC

## 2016-12-17 NOTE — Progress Notes (Signed)
HPI:   ACUTE VISIT:  Chief Complaint  Patient presents with  . right ear  congestion    [name] is a 71 y.o. male, who is here today complaining of 1-2 days of constant right earache. He states that he is following with ENT because intermittent earache sine 07/2016 when he was Dx with flu. He recently followed with ENT and according to pt the plan is to do tympanostomy tube, 12/2016.  Sharp/throbbing pain, no radiated.   Otalgia   There is pain in the right ear. This is a recurrent problem. The current episode started yesterday. The problem occurs constantly. The problem has been gradually worsening. There has been no fever. The pain is at a severity of 6/10. The pain is moderate. Associated symptoms include hearing loss. Pertinent negatives include no abdominal pain, coughing, diarrhea, ear discharge, headaches, neck pain, rash, rhinorrhea, sore throat or vomiting. He has tried nothing for the symptoms. The treatment provided no relief.   Pain is exacerbated by bending over and with direct pressure on external ear, no alleviated factors. Chronic tinnitus unchanged. No changes in hearing.  He has not noted edema or erythema of external ear. No hx of ear trauma.   Review of Systems  Constitutional: Positive for fatigue. Negative for activity change, appetite change, chills and fever.  HENT: Positive for ear pain and hearing loss. Negative for congestion, ear discharge, mouth sores, rhinorrhea, sore throat, trouble swallowing and voice change.   Respiratory: Negative for cough, shortness of breath and wheezing.   Gastrointestinal: Negative for abdominal pain, diarrhea, nausea and vomiting.  Musculoskeletal: Negative for myalgias and neck pain.  Skin: Negative for rash.  Neurological: Negative for weakness and headaches.  Hematological: Negative for adenopathy. Does not bruise/bleed easily.      Current Outpatient Prescriptions on File Prior to Visit    Medication Sig Dispense Refill  . aspirin EC 81 MG tablet Take 1 tablet (81 mg total) by mouth daily.    Marland Kitchen atorvastatin (LIPITOR) 40 MG tablet Take 1 tablet (40 mg total) by mouth daily. 90 tablet 3  . clopidogrel (PLAVIX) 75 MG tablet TAKE 1 TABLET(75 MG) BY MOUTH DAILY 90 tablet 3  . metoprolol tartrate (LOPRESSOR) 50 MG tablet TAKE 1/2 TABLET BY MOUTH 2 TIMES DAILY 90 tablet 3  . Multiple Vitamins-Minerals (MULTIVITAMIN & MINERAL PO) Take 1 tablet by mouth daily.    . nitroGLYCERIN (NITROSTAT) 0.4 MG SL tablet Place 1 tablet (0.4 mg total) under the tongue every 5 (five) minutes as needed for chest pain. 25 tablet 3  . Omega-3 Krill Oil 500 MG CAPS Take 1 capsule by mouth daily.    . pantoprazole (PROTONIX) 40 MG tablet Take 1 tablet (40 mg total) by mouth daily. 90 tablet 3  . ramipril (ALTACE) 10 MG capsule Take 1 capsule (10 mg total) by mouth daily. 90 capsule 3   No current facility-administered medications on file prior to visit.      Past Medical History:  Diagnosis Date  . Colitis   . Coronary artery disease   . DJD (degenerative joint disease)   . Gallbladder disease   . GERD (gastroesophageal reflux disease)   . Hearing loss   . Hyperlipidemia   . Hypertension   . IBS (irritable bowel syndrome)   . Increased prostate specific antigen (PSA) velocity    Allergies  Allergen Reactions  . Flagyl [Metronidazole]     Social History   Social History  . Marital  status: Married    Spouse name: linda wrenn Alaimo  . Number of children: 2  . Years of education: N/A   Occupational History  . engineering     Retired   Social History Main Topics  . Smoking status: Never Smoker  . Smokeless tobacco: Never Used  . Alcohol use No     Comment: rare use  . Drug use: No  . Sexual activity: Yes   Other Topics Concern  . None   Social History Narrative  . None    Vitals:   12/17/16 1245  BP: 130/70  Pulse: 65  Resp: 12  Temp: 97.6 F (36.4 C)   Body mass  index is 30.55 kg/m.   Physical Exam  Nursing note and vitals reviewed. Constitutional: He is oriented to person, place, and time. He appears well-developed. He does not appear ill. No distress.  HENT:  Head: Atraumatic.  Right Ear: Ear canal normal. There is tenderness (Mild). No drainage. No mastoid tenderness. Tympanic membrane is erythematous and bulging. A middle ear effusion is present.  Left Ear: Tympanic membrane, external ear and ear canal normal.  Mouth/Throat: Oropharynx is clear and moist and mucous membranes are normal.  Eyes: Conjunctivae and EOM are normal.  Neck: No muscular tenderness present.  Respiratory: Effort normal and breath sounds normal. No stridor. No respiratory distress.  Lymphadenopathy:       Head (right side): No submandibular, no preauricular and no posterior auricular adenopathy present.       Head (left side): No submandibular, no preauricular and no posterior auricular adenopathy present.    He has no cervical adenopathy.  Neurological: He is alert and oriented to person, place, and time. He has normal strength. Gait normal.  Skin: Skin is warm. No rash noted. No erythema.  Psychiatric: He has a normal mood and affect.  Well groomed, good eye contact.     ASSESSMENT AND PLAN:   Meet was seen today for right ear  congestion.  Diagnoses and all orders for this visit:  Right otitis media, unspecified otitis media type  Autoinflation maneuvers a few times per day. Some side effects of abx treatment discussed. Instructed about warning signs. F/U as needed.  -     amoxicillin-clavulanate (AUGMENTIN) 875-125 MG tablet; Take 1 tablet by mouth 2 (two) times daily.  Acute otitis externa of right ear, unspecified type  Mild. Topical abx recommended. F/U as needed.  -     ciprofloxacin-dexamethasone (CIPRODEX) otic suspension; Place 4 drops into the right ear 2 (two) times daily.     -[name] advised to seek immediate medical  attention is symptoms suddenly get worse or to follow if symptoms persist or new concerns arise.       Betty G. Martinique, MD  [facility]. [place] office.

## 2016-12-17 NOTE — Progress Notes (Signed)
Pre visit review using our clinic review tool, if applicable. No additional management support is needed unless otherwise documented below in the visit note. 

## 2016-12-17 NOTE — Patient Instructions (Signed)
  [name] I have seen you today for an acute visit.  A few things to remember from today's visit:   Right otitis media, unspecified otitis media type - Plan: amoxicillin-clavulanate (AUGMENTIN) 875-125 MG tablet  Acute otitis externa of right ear, unspecified type - Plan: ciprofloxacin-dexamethasone (CIPRODEX) otic suspension    Medications prescribed today are intended for short period of time and will not be refill upon request, a follow up appointment might be necessary to discuss continuation of of treatment if appropriate.     In general please monitor for signs of worsening symptoms and seek immediate medical attention if any concerning.  If symptoms are not resolved in 3-4 days you should schedule a follow up appointment with your doctor, before if needed.

## 2017-01-12 DIAGNOSIS — H6521 Chronic serous otitis media, right ear: Secondary | ICD-10-CM | POA: Diagnosis not present

## 2017-01-12 DIAGNOSIS — H9311 Tinnitus, right ear: Secondary | ICD-10-CM | POA: Diagnosis not present

## 2017-01-12 DIAGNOSIS — H903 Sensorineural hearing loss, bilateral: Secondary | ICD-10-CM | POA: Diagnosis not present

## 2017-01-13 ENCOUNTER — Other Ambulatory Visit: Payer: Self-pay | Admitting: Family Medicine

## 2017-01-26 DIAGNOSIS — H903 Sensorineural hearing loss, bilateral: Secondary | ICD-10-CM | POA: Diagnosis not present

## 2017-01-26 DIAGNOSIS — H6981 Other specified disorders of Eustachian tube, right ear: Secondary | ICD-10-CM | POA: Diagnosis not present

## 2017-03-22 ENCOUNTER — Other Ambulatory Visit: Payer: Self-pay | Admitting: Cardiovascular Disease

## 2017-03-28 ENCOUNTER — Encounter: Payer: Self-pay | Admitting: Family Medicine

## 2017-03-28 ENCOUNTER — Ambulatory Visit (INDEPENDENT_AMBULATORY_CARE_PROVIDER_SITE_OTHER): Payer: Medicare HMO | Admitting: Family Medicine

## 2017-03-28 VITALS — BP 130/65 | HR 53 | Temp 98.1°F | Ht 67.5 in | Wt 206.2 lb

## 2017-03-28 DIAGNOSIS — L723 Sebaceous cyst: Secondary | ICD-10-CM | POA: Diagnosis not present

## 2017-03-28 DIAGNOSIS — I10 Essential (primary) hypertension: Secondary | ICD-10-CM

## 2017-03-28 DIAGNOSIS — L089 Local infection of the skin and subcutaneous tissue, unspecified: Secondary | ICD-10-CM | POA: Diagnosis not present

## 2017-03-28 MED ORDER — CEPHALEXIN 500 MG PO CAPS: 500.0000 mg | ORAL_CAPSULE | Freq: Three times a day (TID) | ORAL | 0 refills | Status: DC

## 2017-03-28 NOTE — Patient Instructions (Addendum)
   I think you have an infected sebaceous cyst  Keep it clean with soap and water  Antibiotic ointment is ok to use  Warm compresses are helpful to speed up healing   Take the keflex (antiboitic) as directed  Update if not starting to improve in a week or if worsening  (if more painful or bigger or more red) If it drains -that is ok   You may want to see a dermatologist in the future to remove the cyst    Keep me posted

## 2017-03-28 NOTE — Assessment & Plan Note (Signed)
L posterior neck  Pierced and no further drainage  tx with keflex 500 tid for 7 days Clean with soap and water  abx oint otc  Update if not starting to improve in a week or if worsening   Pt may look into removal by dermatology

## 2017-03-28 NOTE — Progress Notes (Signed)
Subjective:    Patient ID: Brian Todd, male    DOB: [birth year], 71 y.o.   MRN: [id number]  HPI Here to check a spot on neck   Had a lump in May  Has not gone away  Goes up and down in size Has burt/drained several times  Has been itching (tries not to scratch it)  Tender when the shower hits it but hot water helps   He put triple abx oint on it yesterday  Got some little bumps around it   BP Readings from Last 3 Encounters:  03/28/17 140/70  12/17/16 130/70  12/12/16 122/66   Better on 2nd check BP: 130/65   Interested in the flu shot but waiting for the high dose to come in   Patient Active Problem List   Diagnosis Date Noted  . Infected sebaceous cyst 03/28/2017  . Hyperkalemia 12/12/2016  . Prostate cancer screening 11/26/2016  . Right hip pain 11/14/2016  . ETD (Eustachian tube dysfunction), right 10/05/2016  . Globus sensation 10/05/2016  . Tachycardia 08/05/2016  . Routine general medical examination at a health care facility 11/17/2015  . Low back pain 07/10/2015  . Coronary artery disease involving native coronary artery without angina pectoris 04/09/2015  . Chest pain 01/01/2015  . History of heart artery stent 01/01/2015  . Rectal bleeding 11/08/2014  . History of colitis 11/08/2014  . Encounter for Medicare annual wellness exam 10/31/2014  . DEGENERATIVE JOINT DISEASE 07/30/2009  . HEARING LOSS, MILD 11/27/2007  . IRRITABLE BOWEL SYNDROME 11/27/2007  . GALLBLADDER DISEASE 11/27/2007  . Hyperlipidemia 08/13/2007  . Essential hypertension 08/13/2007  . GERD 08/13/2007   Past Medical History:  Diagnosis Date  . Colitis   . Coronary artery disease   . DJD (degenerative joint disease)   . Gallbladder disease   . GERD (gastroesophageal reflux disease)   . Hearing loss   . Hyperlipidemia   . Hypertension   . IBS (irritable bowel syndrome)   . Increased prostate specific antigen (PSA) velocity    Past Surgical History:  Procedure Laterality  Date  . CARDIAC CATHETERIZATION    . CARDIAC CATHETERIZATION N/A 01/07/2015   Procedure: Left Heart Cath and Coronary Angiography;  Surgeon: [name], MD;  Location: [place] [facility];  Service: Cardiovascular;  Laterality: N/A;  . CARDIAC CATHETERIZATION N/A 01/07/2015   Procedure: Coronary Stent Intervention;  Surgeon: [name], MD;  Location: [place] [facility];  Service: Cardiovascular;  Laterality: N/A;  . COLONOSCOPY    . CORONARY STENT PLACEMENT     Dr. [name]  . CORONARY STENT PLACEMENT  01/07/2015   LAD  . TONSILLECTOMY AND ADENOIDECTOMY     in the 2nd grade   Social History  Substance Use Topics  . Smoking status: Never Smoker  . Smokeless tobacco: Never Used  . Alcohol use No     Comment: rare use   Family History  Problem Relation Age of Onset  . Stroke Mother    Allergies  Allergen Reactions  . Flagyl [Metronidazole]    Current Outpatient Prescriptions on File Prior to Visit  Medication Sig Dispense Refill  . aspirin EC 81 MG tablet Take 1 tablet (81 mg total) by mouth daily.    Marland Kitchen atorvastatin (LIPITOR) 40 MG tablet Take 1 tablet (40 mg total) by mouth daily. 90 tablet 3  . clopidogrel (PLAVIX) 75 MG tablet TAKE 1 TABLET EVERY DAY 90 tablet 3  . metoprolol tartrate (LOPRESSOR) 50 MG tablet TAKE  1/2 TABLET BY MOUTH 2 TIMES DAILY 90 tablet 3  . Multiple Vitamins-Minerals (MULTIVITAMIN & MINERAL PO) Take 1 tablet by mouth daily.    . nitroGLYCERIN (NITROSTAT) 0.4 MG SL tablet Place 1 tablet (0.4 mg total) under the tongue every 5 (five) minutes as needed for chest pain. 25 tablet 3  . Omega-3 Krill Oil 500 MG CAPS Take 1 capsule by mouth daily.    . pantoprazole (PROTONIX) 40 MG tablet Take 1 tablet (40 mg total) by mouth daily. 90 tablet 3  . ramipril (ALTACE) 10 MG capsule Take 1 capsule (10 mg total) by mouth daily. 90 capsule 3   No current facility-administered medications on file prior to visit.     Review of Systems    Review of Systems    Constitutional: Negative for fever, appetite change, fatigue and unexpected weight change.  Eyes: Negative for pain and visual disturbance.  Respiratory: Negative for cough and shortness of breath.   Cardiovascular: Negative for cp or palpitations    Gastrointestinal: Negative for nausea, diarrhea and constipation.  Genitourinary: Negative for urgency and frequency.  Skin: Negative for pallor or rash  pos for cyst on neck  Neurological: Negative for weakness, light-headedness, numbness and headaches.  Hematological: Negative for adenopathy. Does not bruise/bleed easily.  Psychiatric/Behavioral: Negative for dysphoric mood. The patient is not nervous/anxious.      Objective:   Physical Exam  Constitutional: He appears well-developed and well-nourished. No distress.  HENT:  Head: Normocephalic and atraumatic.  Eyes: Pupils are equal, round, and reactive to light. Conjunctivae and EOM are normal. Right eye exhibits no discharge. Left eye exhibits no discharge. No scleral icterus.  Neck: Normal range of motion. Neck supple.  Cardiovascular: Normal rate and regular rhythm.   Neurological: He is alert. No cranial nerve deficit. He exhibits normal muscle tone. Coordination normal.  Skin: Skin is warm and dry.  1 cm round soft cyst on L posterior neck - with 2 small openings and several small papules surrounding it Pierced one opening after cleaning- was not able to express any material  Some erythema w/o streaking or extension Mildly tender   Psychiatric: He has a normal mood and affect.          Assessment & Plan:   Problem List Items Addressed This Visit      Cardiovascular and Mediastinum   Essential hypertension    bp in fair control at this time  BP Readings from Last 1 Encounters:  03/28/17 130/65   No changes needed Disc lifstyle change with low sodium diet and exercise          Musculoskeletal and Integument   Infected sebaceous cyst    L posterior neck  Pierced  and no further drainage  tx with keflex 500 tid for 7 days Clean with soap and water  abx oint otc  Update if not starting to improve in a week or if worsening   Pt may look into removal by dermatology      Relevant Medications   cephALEXin (KEFLEX) 500 MG capsule

## 2017-03-28 NOTE — Assessment & Plan Note (Signed)
bp in fair control at this time  BP Readings from Last 1 Encounters:  03/28/17 130/65   No changes needed Disc lifstyle change with low sodium diet and exercise

## 2017-04-10 IMAGING — CR DG LUMBAR SPINE COMPLETE 4+V
5 series · 5 of 5 positions shown · non-contrast
Comparison: None.

CLINICAL DATA: Chronic lumbago.  No appreciable sciatica

EXAM:
LUMBAR SPINE - COMPLETE 4+ VIEW

[view not recorded (1 of 5)]
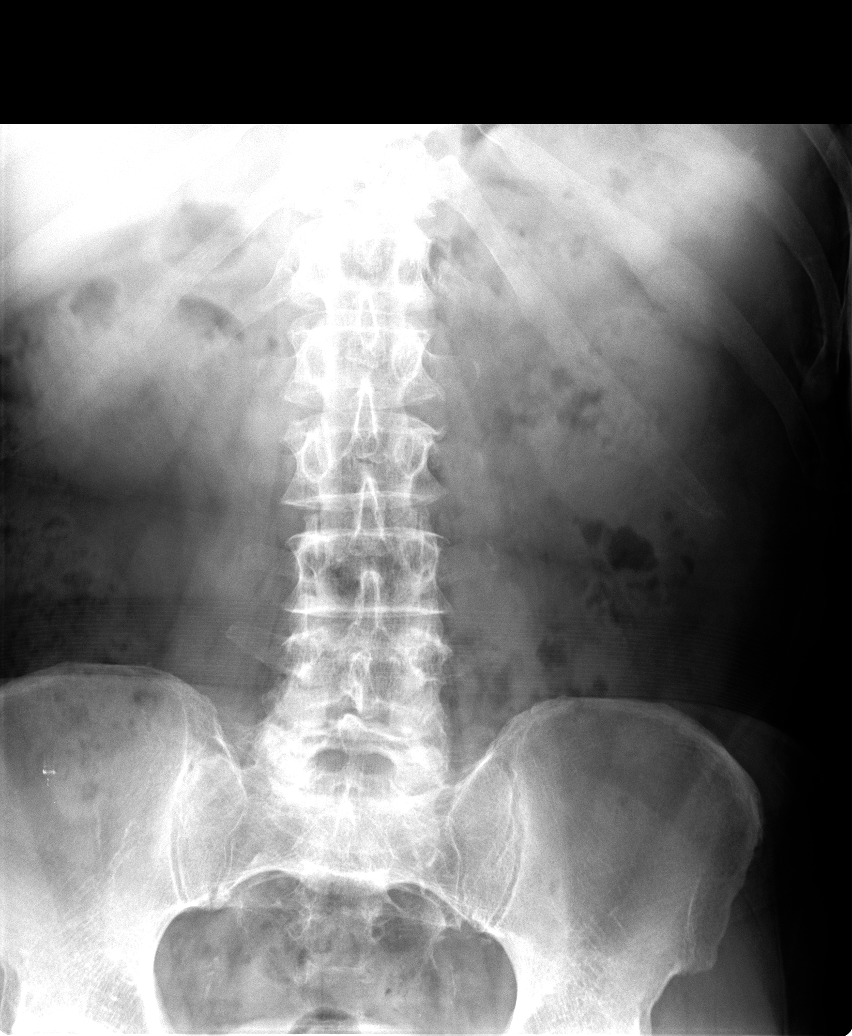

[view not recorded (2 of 5)]
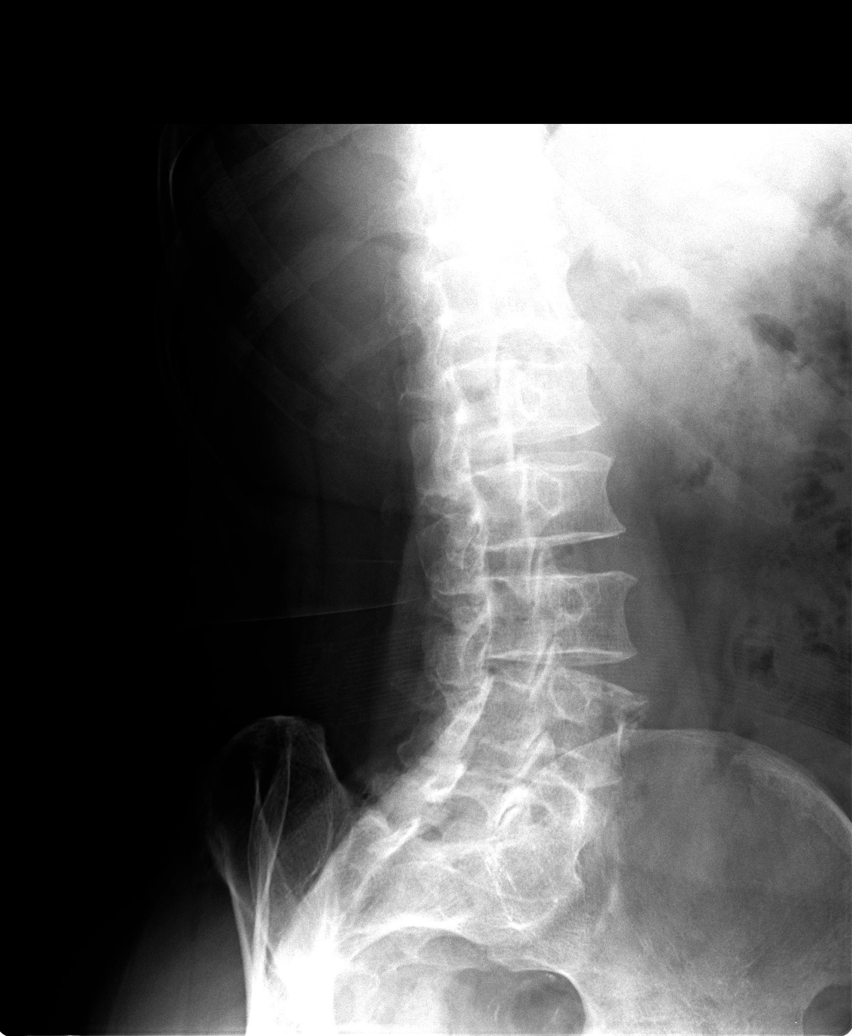

[view not recorded (3 of 5)]
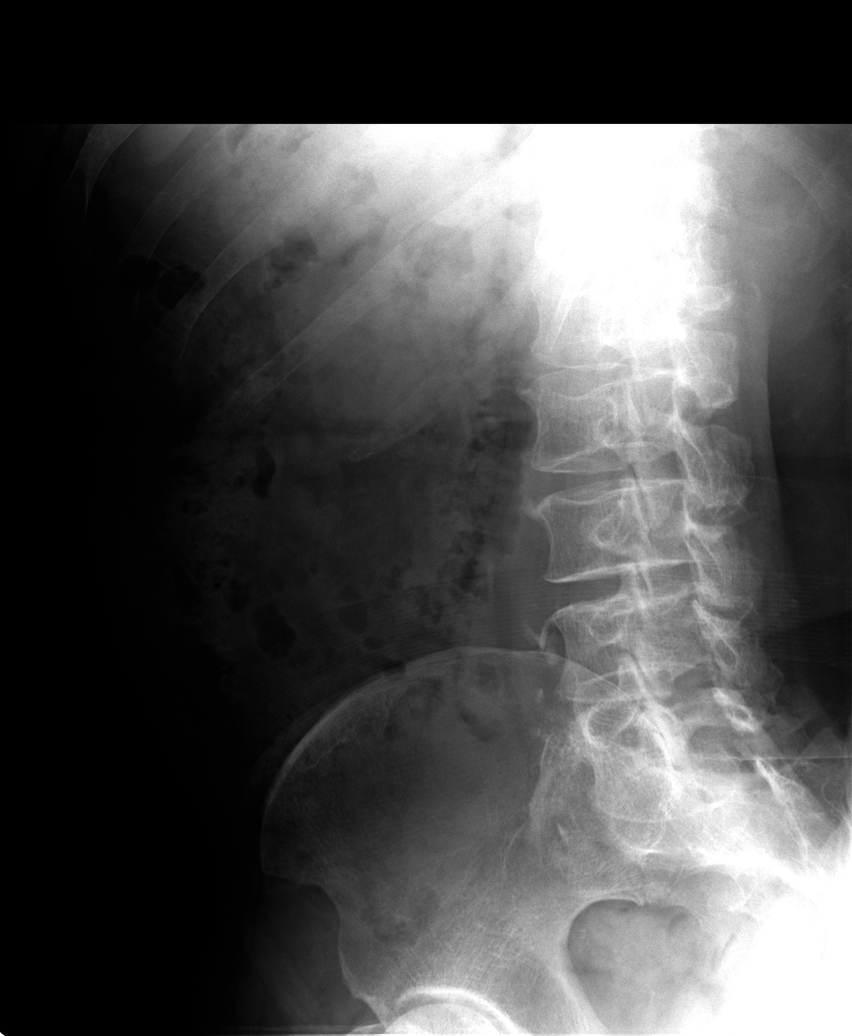

[view not recorded (4 of 5)]
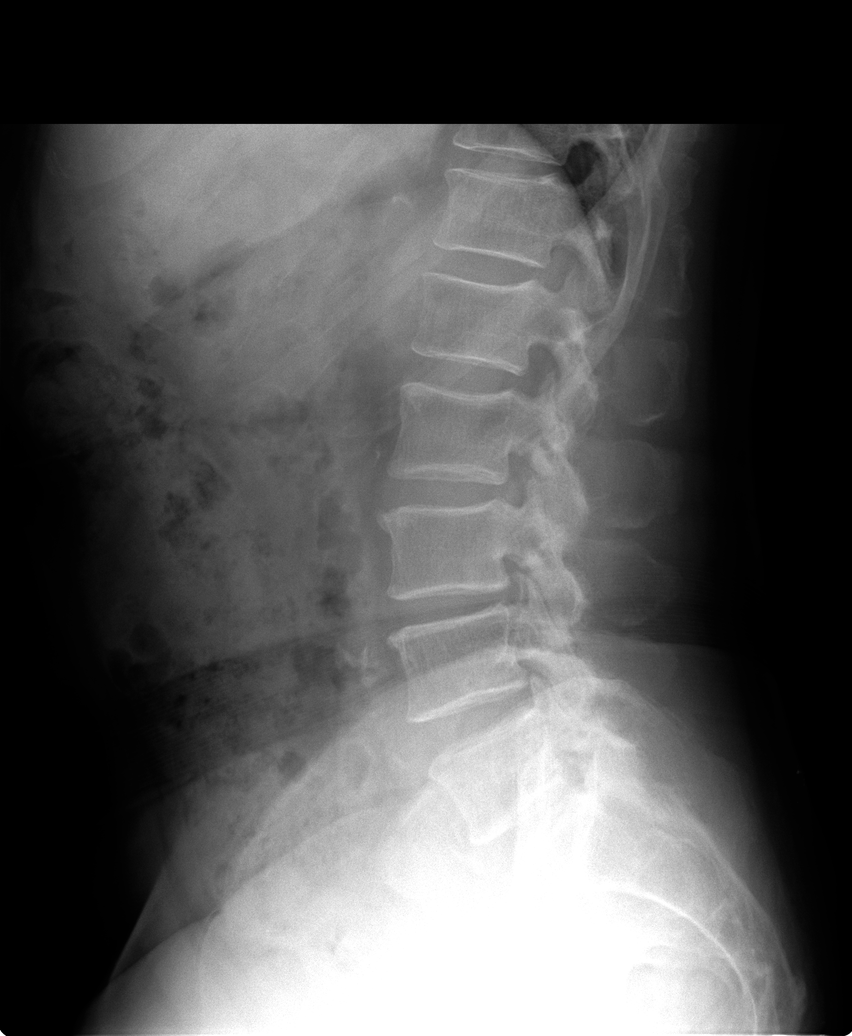

[view not recorded (5 of 5)]
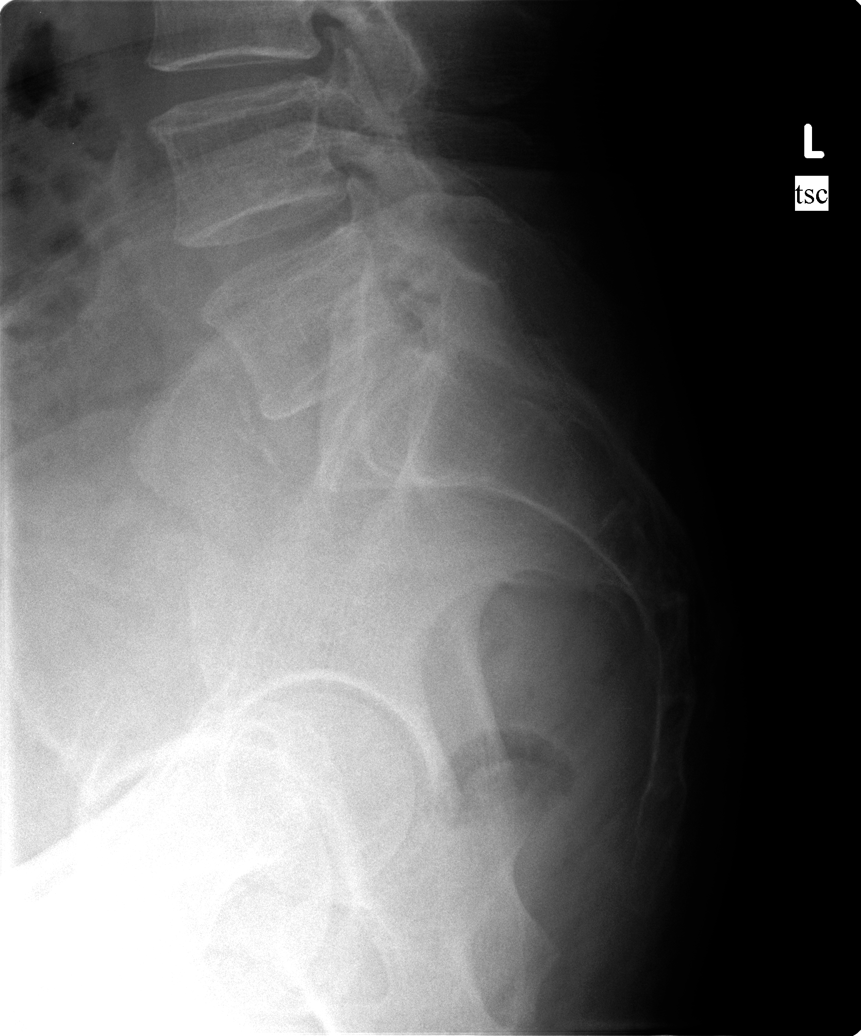

[5 of 5 positions shown; findings below may reference images not displayed]

FINDINGS: Frontal, lateral, spot lumbosacral lateral, and bilateral oblique
views were obtained. There are 5 non-rib-bearing lumbar type
vertebral bodies. T12 ribs are somewhat hypoplastic. There is no
fracture or spondylolisthesis. There is slight disc space narrowing
at L3-4. Other disc spaces appear normal. There is facet
osteoarthritic chain at L5-S1 bilaterally and at L4-5 on the left.
IMPRESSION: Areas of osteoarthritic change.  No fracture or spondylolisthesis.

## 2017-04-14 ENCOUNTER — Telehealth: Payer: Self-pay

## 2017-04-14 DIAGNOSIS — L723 Sebaceous cyst: Secondary | ICD-10-CM

## 2017-04-14 NOTE — Telephone Encounter (Signed)
Pt left v/m;pt was seen 09/*04/18 with sebaceous cyst; pt was to cb if cyst reappeared on back of neck; had apparently gone away but now pts wife told pt appears to be swelling again with a white spot in middle of cyst. Pt request cb with what to do.

## 2017-04-14 NOTE — Telephone Encounter (Signed)
Ref done  Will route to [facility] 

## 2017-04-14 NOTE — Telephone Encounter (Signed)
Pt agrees with referral to derm. Pt's wife goes to [facility] and he would like to be referred there. Please put referral in and I advise pt our [facility] will call to schedule appt

## 2017-04-14 NOTE — Telephone Encounter (Signed)
Sounds like we should have dermatology remove it (it will keep coming back)- let me know if pain or redness or if it is draining  Is he ok with a derm ref?  Does he have a dermatologist ?

## 2017-04-18 DIAGNOSIS — R69 Illness, unspecified: Secondary | ICD-10-CM | POA: Diagnosis not present

## 2017-04-28 ENCOUNTER — Telehealth: Payer: Self-pay

## 2017-04-28 DIAGNOSIS — M25551 Pain in right hip: Secondary | ICD-10-CM

## 2017-04-28 MED ORDER — TRAMADOL HCL 50 MG PO TABS: 50.0000 mg | ORAL_TABLET | Freq: Three times a day (TID) | ORAL | 0 refills | Status: DC | PRN

## 2017-04-28 NOTE — Telephone Encounter (Signed)
Referral done  Will route to [facility]  Please call in tramadol -use with caution of sedation

## 2017-04-28 NOTE — Telephone Encounter (Signed)
I would like to set him up with orthopedics -please see if agreeable  Thanks

## 2017-04-28 NOTE — Telephone Encounter (Signed)
Medication phoned to pharmacy.  

## 2017-04-28 NOTE — Telephone Encounter (Signed)
Pt left v/m; pt seen 10/2016 with rt hip and leg pain; pt had xrays and went to PT. Now pt having rt hip and leg pain again and pain seems worse than when seen in April.. Pt wants to know what should do. Tylenol is not helping. Pt cannot stand up straight to walk. Pt is most comfortable in recliner. Pt request cb.[facility].

## 2017-04-28 NOTE — Telephone Encounter (Signed)
Pt agrees with referral to ortho, please put referral in and I advise pt our [facility] will call to scheduled appt.  Pt wanted me to ask Dr. Glori Bickers what should he take for pain until he can get in with ortho, pt has been taking tylenol and it's not helping at all, if Dr. Glori Bickers sends a Rx in [facility]

## 2017-05-03 DIAGNOSIS — M48061 Spinal stenosis, lumbar region without neurogenic claudication: Secondary | ICD-10-CM | POA: Diagnosis not present

## 2017-05-13 DIAGNOSIS — R69 Illness, unspecified: Secondary | ICD-10-CM | POA: Diagnosis not present

## 2017-05-31 DIAGNOSIS — M48061 Spinal stenosis, lumbar region without neurogenic claudication: Secondary | ICD-10-CM | POA: Diagnosis not present

## 2017-05-31 DIAGNOSIS — M25551 Pain in right hip: Secondary | ICD-10-CM | POA: Diagnosis not present

## 2017-06-21 DIAGNOSIS — L720 Epidermal cyst: Secondary | ICD-10-CM | POA: Diagnosis not present

## 2017-06-21 DIAGNOSIS — D225 Melanocytic nevi of trunk: Secondary | ICD-10-CM | POA: Diagnosis not present

## 2017-06-21 DIAGNOSIS — D2339 Other benign neoplasm of skin of other parts of face: Secondary | ICD-10-CM | POA: Diagnosis not present

## 2017-06-21 DIAGNOSIS — L814 Other melanin hyperpigmentation: Secondary | ICD-10-CM | POA: Diagnosis not present

## 2017-06-21 DIAGNOSIS — L821 Other seborrheic keratosis: Secondary | ICD-10-CM | POA: Diagnosis not present

## 2017-06-21 DIAGNOSIS — D485 Neoplasm of uncertain behavior of skin: Secondary | ICD-10-CM | POA: Diagnosis not present

## 2017-08-07 ENCOUNTER — Encounter: Payer: Self-pay | Admitting: Family Medicine

## 2017-08-07 DIAGNOSIS — D485 Neoplasm of uncertain behavior of skin: Secondary | ICD-10-CM | POA: Diagnosis not present

## 2017-08-07 DIAGNOSIS — L988 Other specified disorders of the skin and subcutaneous tissue: Secondary | ICD-10-CM | POA: Diagnosis not present

## 2017-08-22 DIAGNOSIS — H9203 Otalgia, bilateral: Secondary | ICD-10-CM | POA: Diagnosis not present

## 2017-08-22 DIAGNOSIS — H903 Sensorineural hearing loss, bilateral: Secondary | ICD-10-CM | POA: Diagnosis not present

## 2017-08-22 DIAGNOSIS — H6123 Impacted cerumen, bilateral: Secondary | ICD-10-CM | POA: Diagnosis not present

## 2017-08-22 DIAGNOSIS — H6981 Other specified disorders of Eustachian tube, right ear: Secondary | ICD-10-CM | POA: Diagnosis not present

## 2017-09-15 ENCOUNTER — Telehealth: Payer: Self-pay | Admitting: *Deleted

## 2017-09-15 MED ORDER — LISINOPRIL 40 MG PO TABS: 40.0000 mg | ORAL_TABLET | Freq: Every day | ORAL | 3 refills | Status: DC

## 2017-09-15 NOTE — Telephone Encounter (Signed)
Pt notified of the switch in meds and advise of Dr. Marliss Coots comments and instructions and verbalized understanding

## 2017-09-15 NOTE — Telephone Encounter (Signed)
Let's change to lisinopril 40  Please let him know If any problems/side eff or low or high bp please alert me  I will send it in

## 2017-09-15 NOTE — Telephone Encounter (Signed)
Received fax saying that ramipril 10 mg is on back order it's unavailable, and there is no release date. They said some suggested alt meds are fosinopril 40mg , benazepril 40 mg, quinapril 40 mg, lisinopril 40 mg, and trandolapril 4mg , please advise

## 2017-09-19 ENCOUNTER — Other Ambulatory Visit: Payer: Self-pay | Admitting: Family Medicine

## 2017-10-19 DIAGNOSIS — R69 Illness, unspecified: Secondary | ICD-10-CM | POA: Diagnosis not present

## 2017-11-30 ENCOUNTER — Encounter: Payer: Self-pay | Admitting: Cardiovascular Disease

## 2017-11-30 ENCOUNTER — Ambulatory Visit: Payer: Medicare HMO | Admitting: Cardiovascular Disease

## 2017-11-30 VITALS — BP 130/66 | HR 53 | Ht 68.0 in | Wt 196.8 lb

## 2017-11-30 DIAGNOSIS — E7800 Pure hypercholesterolemia, unspecified: Secondary | ICD-10-CM | POA: Diagnosis not present

## 2017-11-30 DIAGNOSIS — I10 Essential (primary) hypertension: Secondary | ICD-10-CM | POA: Diagnosis not present

## 2017-11-30 DIAGNOSIS — I2510 Atherosclerotic heart disease of native coronary artery without angina pectoris: Secondary | ICD-10-CM | POA: Diagnosis not present

## 2017-11-30 MED ORDER — METOPROLOL TARTRATE 25 MG PO TABS: 25.0000 mg | ORAL_TABLET | Freq: Two times a day (BID) | ORAL | 3 refills | Status: DC

## 2017-11-30 NOTE — Patient Instructions (Signed)
Medication Instructions: Continue same medications.   Labwork: None.   Procedures/Testing: None.   Follow-Up: 1 year with Dr. Arida.   Any Additional Special Instructions Will Be Listed Below (If Applicable).     If you need a refill on your cardiac medications before your next appointment, please call your pharmacy.   

## 2017-11-30 NOTE — Progress Notes (Signed)
Cardiology Office Note   Date:  11/30/2017   ID:  YOUSEF HUGE, DOB [birth year], MRN [id number]  PCP:  Abner Greenspan, MD  Cardiologist:   Kathlyn Sacramento, MD   Chief Complaint  Patient presents with  . Other    12 month follow up. Patient deneis chest pain and SOB. Patient states he is staying pretty active. Meds reviewed verbally with patient.       History of Present Illness: Brian Todd is a 72 y.o. male who presents for a follow-up visit regarding coronary artery disease. He had a small non-ST elevation myocardial infarction in 2001. Cardiac catheterization showed a 95% mid LAD stenosis and 40% stenosis in OM branch. He had successful angioplasty and Taxus drug-eluting stent placement to the mid LAD without complications.  He Had recurrent angina in June 2016. Cardiac catheterization showed 99% in-stent restenosis in the mid LAD extending to the distal edge, 60% mid left circumflex stenosis and mild RCA disease. I performed successful angioplasty and drug-eluting stent placement to the mid LAD.  He has other chronic medical conditions that include hypertension, hyperlipidemia and gastroesophageal reflux disease.  He has been doing extremely well with no recent chest pain, shortness of breath or palpitations.  He takes his medications regularly with no reported side effects.  He has mild mid bradycardia on metoprolol with no symptoms.  Past Medical History:  Diagnosis Date  . Colitis   . Coronary artery disease   . DJD (degenerative joint disease)   . Gallbladder disease   . GERD (gastroesophageal reflux disease)   . Hearing loss   . Hyperlipidemia   . Hypertension   . IBS (irritable bowel syndrome)   . Increased prostate specific antigen (PSA) velocity     Past Surgical History:  Procedure Laterality Date  . CARDIAC CATHETERIZATION    . CARDIAC CATHETERIZATION N/A 01/07/2015   Procedure: Left Heart Cath and Coronary Angiography;  Surgeon: [name], MD;   Location: [place] [facility];  Service: Cardiovascular;  Laterality: N/A;  . CARDIAC CATHETERIZATION N/A 01/07/2015   Procedure: Coronary Stent Intervention;  Surgeon: [name], MD;  Location: [place] [facility];  Service: Cardiovascular;  Laterality: N/A;  . COLONOSCOPY    . CORONARY STENT PLACEMENT     Dr. [name]  . CORONARY STENT PLACEMENT  01/07/2015   LAD  . TONSILLECTOMY AND ADENOIDECTOMY     in the 2nd grade     Current Outpatient Medications  Medication Sig Dispense Refill  . aspirin EC 81 MG tablet Take 1 tablet (81 mg total) by mouth daily.    Marland Kitchen atorvastatin (LIPITOR) 40 MG tablet Take 1 tablet (40 mg total) by mouth daily. 90 tablet 3  . clopidogrel (PLAVIX) 75 MG tablet TAKE 1 TABLET EVERY DAY 90 tablet 3  . metoprolol tartrate (LOPRESSOR) 50 MG tablet TAKE 1/2 TABLET BY MOUTH 2 TIMES DAILY 90 tablet 3  . Multiple Vitamins-Minerals (MULTIVITAMIN & MINERAL PO) Take 1 tablet by mouth daily.    . nitroGLYCERIN (NITROSTAT) 0.4 MG SL tablet Place 1 tablet (0.4 mg total) under the tongue every 5 (five) minutes as needed for chest pain. 25 tablet 3  . Omega-3 Krill Oil 500 MG CAPS Take 1 capsule by mouth daily.    . pantoprazole (PROTONIX) 40 MG tablet Take 1 tablet (40 mg total) by mouth daily. 90 tablet 3  . ramipril (ALTACE) 10 MG capsule TAKE 1 CAPSULE BY MOUTH EVERY DAY 90 capsule 1  No current facility-administered medications for this visit.     Allergies:   Flagyl [metronidazole]    Social History:  The patient  reports that he has never smoked. He has never used smokeless tobacco. He reports that he does not drink alcohol or use drugs.   Family History:  The patient's family history includes Stroke in his mother.    ROS:  Please see the history of present illness.   Otherwise, review of systems are positive for none.   All other systems are reviewed and negative.    PHYSICAL EXAM: VS:  BP 130/66 (BP Location: Left Arm, Patient Position: Sitting, Cuff  Size: Normal)   Pulse (!) 53   Ht 5\' 8"  (1.727 m)   Wt 196 lb 12 oz (89.2 kg)   BMI 29.92 kg/m  , BMI Body mass index is 29.92 kg/m. GEN: Well nourished, well developed, in no acute distress  HEENT: normal  Neck: no JVD, carotid bruits, or masses Cardiac: RRR; no murmurs, rubs, or gallops,no edema  Respiratory:  clear to auscultation bilaterally, normal work of breathing GI: soft, nontender, nondistended, + BS MS: no deformity or atrophy  Skin: warm and dry, no rash Neuro:  Strength and sensation are intact Psych: euthymic mood, full affect   EKG:  EKG is ordered today. The ekg ordered today demonstrates sinus bradycardia with no significant ST or T wave changes.   Recent Labs: 12/01/2016: ALT 32; BUN 15; Creatinine, Ser 1.07; Hemoglobin 14.7; Platelets 212.0; Sodium 140; TSH 1.76 12/12/2016: Potassium 4.5    Lipid Panel    Component Value Date/Time   CHOL 110 12/01/2016 1237   TRIG 87.0 12/01/2016 1237   HDL 43.80 12/01/2016 1237   CHOLHDL 3 12/01/2016 1237   VLDL 17.4 12/01/2016 1237   LDLCALC 49 12/01/2016 1237   LDLDIRECT 76.5 09/02/2010 1036      Wt Readings from Last 3 Encounters:  11/30/17 196 lb 12 oz (89.2 kg)  03/28/17 206 lb 4 oz (93.6 kg)  12/17/16 198 lb (89.8 kg)        ASSESSMENT AND PLAN:  1.  Coronary artery disease involving native coronary arteries without angina: The patient overall is doing well from a cardiac standpoint. Given that he has a stent within a stent, I am planning to keep him on long-term dual antiplatelet therapy as tolerated.  2. Hyperlipidemia: Continue high dose atorvastatin.  I reviewed lipid profile from last year which was optimal with an LDL of 49 and triglyceride of 87.  3. Essential hypertension: Blood pressure is well controlled on metoprolol and ramipril.  If bradycardia worsens, I plan on switching him from metoprolol to carvedilol.   Disposition:   FU with me in 1 year  Signed,  Kathlyn Sacramento, MD  11/30/2017  9:52 AM    [facility]

## 2017-12-11 ENCOUNTER — Telehealth: Payer: Self-pay | Admitting: Family Medicine

## 2017-12-11 DIAGNOSIS — I10 Essential (primary) hypertension: Secondary | ICD-10-CM

## 2017-12-11 DIAGNOSIS — Z125 Encounter for screening for malignant neoplasm of prostate: Secondary | ICD-10-CM

## 2017-12-11 DIAGNOSIS — E7800 Pure hypercholesterolemia, unspecified: Secondary | ICD-10-CM

## 2017-12-11 NOTE — Telephone Encounter (Signed)
-----   Message from Eustace Pen, LPN sent at 9/70/2637  2:46 PM EDT ----- Regarding: Labs 5/21 Lab orders needed. Thank you.  Insurance:  [employer]

## 2017-12-12 ENCOUNTER — Ambulatory Visit: Payer: Medicare HMO

## 2017-12-12 ENCOUNTER — Ambulatory Visit (INDEPENDENT_AMBULATORY_CARE_PROVIDER_SITE_OTHER): Payer: Medicare HMO

## 2017-12-12 VITALS — BP 140/76 | HR 58 | Temp 97.6°F | Ht 67.5 in | Wt 199.5 lb

## 2017-12-12 DIAGNOSIS — I10 Essential (primary) hypertension: Secondary | ICD-10-CM

## 2017-12-12 DIAGNOSIS — E7800 Pure hypercholesterolemia, unspecified: Secondary | ICD-10-CM | POA: Diagnosis not present

## 2017-12-12 DIAGNOSIS — Z0000 Encounter for general adult medical examination without abnormal findings: Secondary | ICD-10-CM

## 2017-12-12 DIAGNOSIS — Z125 Encounter for screening for malignant neoplasm of prostate: Secondary | ICD-10-CM | POA: Diagnosis not present

## 2017-12-12 LAB — CBC WITH DIFFERENTIAL/PLATELET
BASOS ABS: 0 10*3/uL (ref 0.0–0.1)
Basophils Relative: 0.5 % (ref 0.0–3.0)
EOS PCT: 2.1 % (ref 0.0–5.0)
Eosinophils Absolute: 0.1 10*3/uL (ref 0.0–0.7)
HEMATOCRIT: 43.3 % (ref 39.0–52.0)
Hemoglobin: 14.5 g/dL (ref 13.0–17.0)
LYMPHS ABS: 1.9 10*3/uL (ref 0.7–4.0)
LYMPHS PCT: 27.3 % (ref 12.0–46.0)
MCHC: 33.6 g/dL (ref 30.0–36.0)
MCV: 92.6 fl (ref 78.0–100.0)
MONOS PCT: 8.2 % (ref 3.0–12.0)
Monocytes Absolute: 0.6 10*3/uL (ref 0.1–1.0)
NEUTROS ABS: 4.3 10*3/uL (ref 1.4–7.7)
NEUTROS PCT: 61.9 % (ref 43.0–77.0)
Platelets: 191 10*3/uL (ref 150.0–400.0)
RBC: 4.68 Mil/uL (ref 4.22–5.81)
RDW: 13.4 % (ref 11.5–15.5)
WBC: 7 10*3/uL (ref 4.0–10.5)

## 2017-12-12 LAB — COMPREHENSIVE METABOLIC PANEL
ALBUMIN: 3.9 g/dL (ref 3.5–5.2)
ALT: 22 U/L (ref 0–53)
AST: 19 U/L (ref 0–37)
Alkaline Phosphatase: 91 U/L (ref 39–117)
BUN: 14 mg/dL (ref 6–23)
CO2: 31 mEq/L (ref 19–32)
Calcium: 9.2 mg/dL (ref 8.4–10.5)
Chloride: 104 mEq/L (ref 96–112)
Creatinine, Ser: 0.93 mg/dL (ref 0.40–1.50)
GFR: 85.34 mL/min (ref >60.00)
GLUCOSE: 97 mg/dL (ref 70–99)
POTASSIUM: 4.7 meq/L (ref 3.5–5.1)
Sodium: 140 mEq/L (ref 135–145)
TOTAL PROTEIN: 6.4 g/dL (ref 6.0–8.3)
Total Bilirubin: 0.7 mg/dL (ref 0.2–1.2)

## 2017-12-12 LAB — PSA, MEDICARE: PSA: 2.25 ng/mL (ref 0.10–4.00)

## 2017-12-12 LAB — LIPID PANEL
CHOLESTEROL: 126 mg/dL (ref 0–200)
HDL: 36.7 mg/dL — AB (ref >39.00)
LDL CALC: 50 mg/dL (ref 0–99)
NonHDL: 89.4
Total CHOL/HDL Ratio: 3
Triglycerides: 197 mg/dL — ABNORMAL HIGH (ref 0.0–149.0)
VLDL: 39.4 mg/dL (ref 0.0–40.0)

## 2017-12-12 LAB — TSH: TSH: 2.85 u[IU]/mL (ref 0.35–4.50)

## 2017-12-12 NOTE — Patient Instructions (Signed)
Brian Todd , Thank you for taking time to come for your Medicare Wellness Visit. I appreciate your ongoing commitment to your health goals. Please review the following plan we discussed and let me know if I can assist you in the future.   These are the goals we discussed: Goals    . Increase physical activity     Starting 12/12/2017, I will continue to exercise for at least 45 minutes 4 days per week.        This is a list of the screening recommended for you and due dates:  Health Maintenance  Topic Date Due  . Flu Shot  02/22/2018  . Tetanus Vaccine  09/02/2020  . Colon Cancer Screening  12/08/2024  .  Hepatitis C: One time screening is recommended by Center for Disease Control  (CDC) for  adults born from [birth year] through 1963.   Completed  . Pneumonia vaccines  Completed   Preventive Care for Adults  A healthy lifestyle and preventive care can promote health and wellness. Preventive health guidelines for adults include the following key practices.  . A routine yearly physical is a good way to check with your health care provider about your health and preventive screening. It is a chance to share any concerns and updates on your health and to receive a thorough exam.  . Visit your dentist for a routine exam and preventive care every 6 months. Brush your teeth twice a day and floss once a day. Good oral hygiene prevents tooth decay and gum disease.  . The frequency of eye exams is based on your age, health, family medical history, use  of contact lenses, and other factors. Follow your health care provider's recommendations for frequency of eye exams.  . Eat a healthy diet. Foods like vegetables, fruits, whole grains, low-fat dairy products, and lean protein foods contain the nutrients you need without too many calories. Decrease your intake of foods high in solid fats, added sugars, and salt. Eat the right amount of calories for you. Get information about a proper diet from your health  care provider, if necessary.  . Regular physical exercise is one of the most important things you can do for your health. Most adults should get at least 150 minutes of moderate-intensity exercise (any activity that increases your heart rate and causes you to sweat) each week. In addition, most adults need muscle-strengthening exercises on 2 or more days a week.  Silver Sneakers may be a benefit available to you. To determine eligibility, you may visit the website: www.silversneakers.com or contact program at 219-548-8287 Mon-Fri between 8AM-8PM.   . Maintain a healthy weight. The body mass index (BMI) is a screening tool to identify possible weight problems. It provides an estimate of body fat based on height and weight. Your health care provider can find your BMI and can help you achieve or maintain a healthy weight.   For adults 20 years and older: ? A BMI below 18.5 is considered underweight. ? A BMI of 18.5 to 24.9 is normal. ? A BMI of 25 to 29.9 is considered overweight. ? A BMI of 30 and above is considered obese.   . Maintain normal blood lipids and cholesterol levels by exercising and minimizing your intake of saturated fat. Eat a balanced diet with plenty of fruit and vegetables. Blood tests for lipids and cholesterol should begin at age 37 and be repeated every 5 years. If your lipid or cholesterol levels are high, you are over 50,  or you are at high risk for heart disease, you may need your cholesterol levels checked more frequently. Ongoing high lipid and cholesterol levels should be treated with medicines if diet and exercise are not working.  . If you smoke, find out from your health care provider how to quit. If you do not use tobacco, please do not start.  . If you choose to drink alcohol, please do not consume more than 2 drinks per day. One drink is considered to be 12 ounces (355 mL) of beer, 5 ounces (148 mL) of wine, or 1.5 ounces (44 mL) of liquor.  . If you are 55-61  years old, ask your health care provider if you should take aspirin to prevent strokes.  . Use sunscreen. Apply sunscreen liberally and repeatedly throughout the day. You should seek shade when your shadow is shorter than you. Protect yourself by wearing long sleeves, pants, a wide-brimmed hat, and sunglasses year round, whenever you are outdoors.  . Once a month, do a whole body skin exam, using a mirror to look at the skin on your back. Tell your health care provider of new moles, moles that have irregular borders, moles that are larger than a pencil eraser, or moles that have changed in shape or color.

## 2017-12-12 NOTE — Progress Notes (Signed)
PCP notes:   Health maintenance:  No gaps identified  Abnormal screenings:   Hearing - failed  Hearing Screening   125Hz  250Hz  500Hz  1000Hz  2000Hz  3000Hz  4000Hz  6000Hz  8000Hz   Right ear:   40 40 40  0    Left ear:   40 40 40  40     Mini-Cog score: 19/20 MMSE - Mini Mental State Exam 12/12/2017 12/01/2016 12/01/2015  Orientation to time 5 5 5   Orientation to Place 5 5 5   Registration 3 3 3   Attention/ Calculation 0 0 0  Recall 2 3 3   Recall-comments unable to recall 1 of 3 words - -  Language- name 2 objects 0 0 0  Language- repeat 1 1 1   Language- follow 3 step command 3 3 3   Language- read & follow direction 0 0 0  Write a sentence 0 0 0  Copy design 0 0 0  Total score 19 20 20     Patient concerns:   None  Nurse concerns:  None  Next PCP appt:   12/19/17 @ 1045  I reviewed health advisor's note, was available for consultation, and agree with documentation and plan. [name] MD

## 2017-12-12 NOTE — Progress Notes (Signed)
Subjective:   Brian Todd is a 72 y.o. male who presents for Medicare Annual/Subsequent preventive examination.  Review of Systems:  N/A Cardiac Risk Factors include: advanced age (>35men, >36 women);male gender;obesity (BMI >30kg/m2);dyslipidemia;hypertension     Objective:    Vitals: BP 140/76 (BP Location: Right Arm, Patient Position: Sitting, Cuff Size: Normal)   Pulse (!) 58   Temp 97.6 F (36.4 C) (Oral)   Ht 5' 7.5" (1.715 m) Comment: no shoes  Wt 199 lb 8 oz (90.5 kg)   SpO2 99%   BMI 30.78 kg/m   Body mass index is 30.78 kg/m.  Advanced Directives 12/12/2017 12/01/2016 12/01/2015 04/04/2015 01/11/2015 01/11/2015 01/07/2015  Does Patient Have a Medical Advance Directive? No No No No Yes No No  Would patient like information on creating a medical advance directive? Yes (MAU/Ambulatory/Procedural Areas - Information given) - Yes - Educational materials given No - patient declined information - No - patient declined information No - patient declined information    Tobacco Social History   Tobacco Use  Smoking Status Never Smoker  Smokeless Tobacco Never Used     Counseling given: No   Clinical Intake:  Pre-visit preparation completed: Yes  Pain : No/denies pain Pain Score: 0-No pain     Nutritional Status: BMI > 30  Obese Nutritional Risks: None Diabetes: No  How often do you need to have someone help you when you read instructions, pamphlets, or other written materials from your doctor or pharmacy?: 1 - Never What is the last grade level you completed in school?: 12th grade + 1 yr college  Interpreter Needed?: No  Comments: pt lives with spouse Information entered by :: LPinson, LPN  Past Medical History:  Diagnosis Date  . Colitis   . Coronary artery disease   . DJD (degenerative joint disease)   . Gallbladder disease   . GERD (gastroesophageal reflux disease)   . Hearing loss   . Hyperlipidemia   . Hypertension   . IBS (irritable bowel  syndrome)   . Increased prostate specific antigen (PSA) velocity    Past Surgical History:  Procedure Laterality Date  . CARDIAC CATHETERIZATION    . CARDIAC CATHETERIZATION N/A 01/07/2015   Procedure: Left Heart Cath and Coronary Angiography;  Surgeon: [name], MD;  Location: [facility];  Service: Cardiovascular;  Laterality: N/A;  . CARDIAC CATHETERIZATION N/A 01/07/2015   Procedure: Coronary Stent Intervention;  Surgeon: [name], MD;  Location: [facility];  Service: Cardiovascular;  Laterality: N/A;  . COLONOSCOPY    . CORONARY STENT PLACEMENT     Dr. [name]  . CORONARY STENT PLACEMENT  01/07/2015   LAD  . TONSILLECTOMY AND ADENOIDECTOMY     in the 2nd grade   Family History  Problem Relation Age of Onset  . Stroke Mother    Social History   Socioeconomic History  . Marital status: Married    Spouse name: linda wrenn Mccathern  . Number of children: 2  . Years of education: Not on file  . Highest education level: Not on file  Occupational History  . Occupation: Engineer, production    Comment: Retired  Scientific laboratory technician  . Financial resource strain: Not on file  . Food insecurity:    Worry: Not on file    Inability: Not on file  . Transportation needs:    Medical: Not on file    Non-medical: Not on file  Tobacco Use  . Smoking status: Never Smoker  .  Smokeless tobacco: Never Used  Substance and Sexual Activity  . Alcohol use: Yes    Alcohol/week: 0.0 oz    Comment: rarely drinks beer/approx. 1 case per year  . Drug use: No  . Sexual activity: Yes  Lifestyle  . Physical activity:    Days per week: Not on file    Minutes per session: Not on file  . Stress: Not on file  Relationships  . Social connections:    Talks on phone: Not on file    Gets together: Not on file    Attends religious service: Not on file    Active member of club or organization: Not on file    Attends meetings of clubs or organizations: Not on file    Relationship status:  Not on file  Other Topics Concern  . Not on file  Social History Narrative  . Not on file    Outpatient Encounter Medications as of 12/12/2017  Medication Sig  . aspirin EC 81 MG tablet Take 1 tablet (81 mg total) by mouth daily.  Marland Kitchen atorvastatin (LIPITOR) 40 MG tablet Take 1 tablet (40 mg total) by mouth daily.  . clopidogrel (PLAVIX) 75 MG tablet TAKE 1 TABLET EVERY DAY  . metoprolol tartrate (LOPRESSOR) 25 MG tablet Take 1 tablet (25 mg total) by mouth 2 (two) times daily.  . Multiple Vitamins-Minerals (MULTIVITAMIN & MINERAL PO) Take 1 tablet by mouth daily.  . nitroGLYCERIN (NITROSTAT) 0.4 MG SL tablet Place 1 tablet (0.4 mg total) under the tongue every 5 (five) minutes as needed for chest pain.  . Omega-3 Krill Oil 500 MG CAPS Take 1 capsule by mouth daily.  . pantoprazole (PROTONIX) 40 MG tablet Take 1 tablet (40 mg total) by mouth daily.  . ramipril (ALTACE) 10 MG capsule TAKE 1 CAPSULE BY MOUTH EVERY DAY   No facility-administered encounter medications on file as of 12/12/2017.     Activities of Daily Living In your present state of health, do you have any difficulty performing the following activities: 12/12/2017  Hearing? N  Vision? N  Difficulty concentrating or making decisions? N  Walking or climbing stairs? N  Dressing or bathing? N  Doing errands, shopping? N  Preparing Food and eating ? N  Using the Toilet? N  In the past six months, have you accidently leaked urine? N  Do you have problems with loss of bowel control? N  Managing your Medications? N  Managing your Finances? N  Housekeeping or managing your Housekeeping? N  Some recent data might be hidden    Patient Care Team: Tower, Wynelle Fanny, MD as PCP - General (Family Medicine) Ralene Bathe, MD as Consulting Physician (Ophthalmology) [name], MD as Consulting Physician (Cardiology)   Assessment:   This is a routine wellness examination for Brian Todd.   Hearing Screening   125Hz  250Hz  500Hz   1000Hz  2000Hz  3000Hz  4000Hz  6000Hz  8000Hz   Right ear:   40 40 40  0    Left ear:   40 40 40  40    Vision Screening Comments: Vision exam in July 2018 with Dr. Claudean Kinds   Exercise Activities and Dietary recommendations Current Exercise Habits: Structured exercise class, Time (Minutes): 45, Frequency (Times/Week): 4, Weekly Exercise (Minutes/Week): 180, Intensity: Moderate, Exercise limited by: None identified  Goals    . Increase physical activity     Starting 12/12/2017, I will continue to exercise for at least 45 minutes 4 days per week.        Fall  Risk Fall Risk  12/12/2017 12/01/2016 12/01/2015 11/17/2015 10/31/2014  Falls in the past year? No No No No No   Depression Screen PHQ 2/9 Scores 12/12/2017 12/01/2016 12/01/2015 11/17/2015  PHQ - 2 Score 0 0 0 0  PHQ- 9 Score 0 - - -    Cognitive Function MMSE - Mini Mental State Exam 12/12/2017 12/01/2016 12/01/2015  Orientation to time 5 5 5   Orientation to Place 5 5 5   Registration 3 3 3   Attention/ Calculation 0 0 0  Recall 2 3 3   Recall-comments unable to recall 1 of 3 words - -  Language- name 2 objects 0 0 0  Language- repeat 1 1 1   Language- follow 3 step command 3 3 3   Language- read & follow direction 0 0 0  Write a sentence 0 0 0  Copy design 0 0 0  Total score 19 20 20      PLEASE NOTE: A Mini-Cog screen was completed. Maximum score is 20. A value of 0 denotes this part of Folstein MMSE was not completed or the patient failed this part of the Mini-Cog screening.   Mini-Cog Screening Orientation to Time - Max 5 pts Orientation to Place - Max 5 pts Registration - Max 3 pts Recall - Max 3 pts Language Repeat - Max 1 pts Language Follow 3 Step Command - Max 3 pts     Immunization History  Administered Date(s) Administered  . Influenza Split 04/16/2011, 04/26/2012  . Influenza Whole 03/25/2009, 03/25/2010  . Influenza, High Dose Seasonal PF 05/13/2017  . Influenza, Seasonal, Injecte, Preservative Fre 04/28/2016  .  Influenza,inj,Quad PF,6+ Mos 04/10/2013, 04/02/2014, 05/04/2015  . Pneumococcal Conjugate-13 10/31/2014  . Pneumococcal Polysaccharide-23 09/16/2013  . Td 09/02/2010    Screening Tests Health Maintenance  Topic Date Due  . INFLUENZA VACCINE  02/22/2018  . TETANUS/TDAP  09/02/2020  . COLONOSCOPY  12/08/2024  . Hepatitis C Screening  Completed  . PNA vac Low Risk Adult  Completed      Plan:     I have personally reviewed, addressed, and noted the following in the patient's chart:  A. Medical and social history B. Use of alcohol, tobacco or illicit drugs  C. Current medications and supplements D. Functional ability and status E.  Nutritional status F.  Physical activity G. Advance directives H. List of other physicians I.  Hospitalizations, surgeries, and ER visits in previous 12 months J.  [place] to include hearing, vision, cognitive, depression L. Referrals and appointments - none  In addition, I have reviewed and discussed with patient certain preventive protocols, quality metrics, and best practice recommendations. A written personalized care plan for preventive services as well as general preventive health recommendations were provided to patient.  See attached scanned questionnaire for additional information.   Signed,   Lindell Noe, MHA, BS, LPN Health Coach

## 2017-12-13 DIAGNOSIS — H9311 Tinnitus, right ear: Secondary | ICD-10-CM | POA: Diagnosis not present

## 2017-12-13 DIAGNOSIS — H7311 Chronic myringitis, right ear: Secondary | ICD-10-CM | POA: Diagnosis not present

## 2017-12-13 DIAGNOSIS — H903 Sensorineural hearing loss, bilateral: Secondary | ICD-10-CM | POA: Diagnosis not present

## 2017-12-13 DIAGNOSIS — H6981 Other specified disorders of Eustachian tube, right ear: Secondary | ICD-10-CM | POA: Diagnosis not present

## 2017-12-13 DIAGNOSIS — H9201 Otalgia, right ear: Secondary | ICD-10-CM | POA: Diagnosis not present

## 2017-12-19 ENCOUNTER — Encounter: Payer: Self-pay | Admitting: Family Medicine

## 2017-12-19 ENCOUNTER — Ambulatory Visit (INDEPENDENT_AMBULATORY_CARE_PROVIDER_SITE_OTHER): Payer: Medicare HMO | Admitting: Family Medicine

## 2017-12-19 VITALS — BP 136/80 | HR 47 | Temp 97.8°F | Ht 67.5 in | Wt 195.2 lb

## 2017-12-19 DIAGNOSIS — L723 Sebaceous cyst: Secondary | ICD-10-CM | POA: Diagnosis not present

## 2017-12-19 DIAGNOSIS — I10 Essential (primary) hypertension: Secondary | ICD-10-CM

## 2017-12-19 DIAGNOSIS — E7800 Pure hypercholesterolemia, unspecified: Secondary | ICD-10-CM

## 2017-12-19 DIAGNOSIS — Z0000 Encounter for general adult medical examination without abnormal findings: Secondary | ICD-10-CM

## 2017-12-19 DIAGNOSIS — Z125 Encounter for screening for malignant neoplasm of prostate: Secondary | ICD-10-CM

## 2017-12-19 MED ORDER — PANTOPRAZOLE SODIUM 40 MG PO TBEC: 40.0000 mg | DELAYED_RELEASE_TABLET | Freq: Every day | ORAL | 3 refills | Status: DC

## 2017-12-19 MED ORDER — ATORVASTATIN CALCIUM 40 MG PO TABS: 40.0000 mg | ORAL_TABLET | Freq: Every day | ORAL | 3 refills | Status: DC

## 2017-12-19 MED ORDER — RAMIPRIL 10 MG PO CAPS: ORAL_CAPSULE | ORAL | 3 refills | Status: DC

## 2017-12-19 NOTE — Progress Notes (Signed)
Subjective:    Patient ID: Brian Todd, male    DOB: 1945-12-08, 72 y.o.   MRN: [id number]  HPI Here for health maintenance exam and to review chronic medical problems    Has felt fine  Nothing new   Went to see Dr Elwyn Reach for R ear problem-his tube came out  That is better now  Did not get a new tube   Wt Readings from Last 3 Encounters:  12/19/17 195 lb 4 oz (88.6 kg)  12/12/17 199 lb 8 oz (90.5 kg)  11/30/17 196 lb 12 oz (89.2 kg)  down 4 lb  Going to aerobics 4 times per week  Diet - very good (except for vacation)  30.13 kg/m   Had amw 5/21 Missed high tone R ear hearing screen  Mini cog 19/20 -missed 1 of the 3 word recall  He is very active- works with a lot of math and geometry  No significant memory problems at home    Colonoscopy 5/16 nl with 10 y recall   Prostate health No family hx of prostate cancer  No urinary issues  Nocturia- very seldom  Lab Results  Component Value Date   PSA 2.25 12/12/2017   PSA 2.22 12/01/2016   PSA 1.84 11/03/2015    Zoster status -interested in shingrix   bp is stable today  No cp or palpitations or headaches or edema  No side effects to medicines  BP Readings from Last 3 Encounters:  12/19/17 136/80  12/12/17 140/76  11/30/17 130/66  pulse was low today Pulse Readings from Last 3 Encounters:  12/19/17 (!) 47  12/12/17 (!) 58  11/30/17 (!) 53    takes metoprolol from cardiology  He feels fine and cardiology is aware   Hyperlipidemia Lab Results  Component Value Date   CHOL 126 12/12/2017   CHOL 110 12/01/2016   CHOL 114 11/03/2015   Lab Results  Component Value Date   HDL 36.70 (L) 12/12/2017   HDL 43.80 12/01/2016   HDL 44.10 11/03/2015   Lab Results  Component Value Date   LDLCALC 50 12/12/2017   LDLCALC 49 12/01/2016   LDLCALC 53 11/03/2015   Lab Results  Component Value Date   TRIG 197.0 (H) 12/12/2017   TRIG 87.0 12/01/2016   TRIG 83.0 11/03/2015   Lab Results  Component Value  Date   CHOLHDL 3 12/12/2017   CHOLHDL 3 12/01/2016   CHOLHDL 3 11/03/2015   Lab Results  Component Value Date   LDLDIRECT 76.5 09/02/2010  atorvastatin and diet  Was on vacation -ate poorly - may be reason for trig (lot of shrimp) HDL down a bit- will watch (has always struggled with it)  Does exercise regularly   Other labs Results for orders placed or performed in visit on 12/12/17  TSH  Result Value Ref Range   TSH 2.85 0.35 - 4.50 uIU/mL  PSA, Medicare  Result Value Ref Range   PSA 2.25 0.10 - 4.00 ng/ml  Lipid panel  Result Value Ref Range   Cholesterol 126 0 - 200 mg/dL   Triglycerides 197.0 (H) 0.0 - 149.0 mg/dL   HDL 36.70 (L) >39.00 mg/dL   VLDL 39.4 0.0 - 40.0 mg/dL   LDL Cholesterol 50 0 - 99 mg/dL   Total CHOL/HDL Ratio 3    NonHDL 89.40   Comprehensive metabolic panel  Result Value Ref Range   Sodium 140 135 - 145 mEq/L   Potassium 4.7 3.5 - 5.1 mEq/L  Chloride 104 96 - 112 mEq/L   CO2 31 19 - 32 mEq/L   Glucose, Bld 97 70 - 99 mg/dL   BUN 14 6 - 23 mg/dL   Creatinine, Ser 0.93 0.40 - 1.50 mg/dL   Total Bilirubin 0.7 0.2 - 1.2 mg/dL   Alkaline Phosphatase 91 39 - 117 U/L   AST 19 0 - 37 U/L   ALT 22 0 - 53 U/L   Total Protein 6.4 6.0 - 8.3 g/dL   Albumin 3.9 3.5 - 5.2 g/dL   Calcium 9.2 8.4 - 10.5 mg/dL   GFR 85.34 >60.00 mL/min  CBC with Differential/Platelet  Result Value Ref Range   WBC 7.0 4.0 - 10.5 K/uL   RBC 4.68 4.22 - 5.81 Mil/uL   Hemoglobin 14.5 13.0 - 17.0 g/dL   HCT 43.3 39.0 - 52.0 %   MCV 92.6 78.0 - 100.0 fl   MCHC 33.6 30.0 - 36.0 g/dL   RDW 13.4 11.5 - 15.5 %   Platelets 191.0 150.0 - 400.0 K/uL   Neutrophils Relative % 61.9 43.0 - 77.0 %   Lymphocytes Relative 27.3 12.0 - 46.0 %   Monocytes Relative 8.2 3.0 - 12.0 %   Eosinophils Relative 2.1 0.0 - 5.0 %   Basophils Relative 0.5 0.0 - 3.0 %   Neutro Abs 4.3 1.4 - 7.7 K/uL   Lymphs Abs 1.9 0.7 - 4.0 K/uL   Monocytes Absolute 0.6 0.1 - 1.0 K/uL   Eosinophils Absolute 0.1  0.0 - 0.7 K/uL   Basophils Absolute 0.0 0.0 - 0.1 K/uL      Review of Systems  Constitutional: Negative for activity change, appetite change, fatigue, fever and unexpected weight change.  HENT: Negative for congestion, rhinorrhea, sore throat and trouble swallowing.   Eyes: Negative for pain, redness, itching and visual disturbance.  Respiratory: Negative for cough, chest tightness, shortness of breath and wheezing.   Cardiovascular: Negative for chest pain and palpitations.  Gastrointestinal: Negative for abdominal pain, blood in stool, constipation, diarrhea and nausea.  Endocrine: Negative for cold intolerance, heat intolerance, polydipsia and polyuria.  Genitourinary: Negative for difficulty urinating, dysuria, frequency and urgency.  Musculoskeletal: Negative for arthralgias, joint swelling and myalgias.  Skin: Negative for pallor and rash.       Derm - mole removed on back this year   Neurological: Negative for dizziness, tremors, weakness, numbness and headaches.  Hematological: Negative for adenopathy. Does not bruise/bleed easily.  Psychiatric/Behavioral: Negative for decreased concentration and dysphoric mood. The patient is not nervous/anxious.        Objective:   Physical Exam  Constitutional: He appears well-developed and well-nourished. No distress.  Well appearing   HENT:  Head: Normocephalic and atraumatic.  Right Ear: External ear normal.  Left Ear: External ear normal.  Nose: Nose normal.  Mouth/Throat: Oropharynx is clear and moist.  Eyes: Pupils are equal, round, and reactive to light. Conjunctivae and EOM are normal. Right eye exhibits no discharge. Left eye exhibits no discharge. No scleral icterus.  Neck: Normal range of motion. Neck supple. No JVD present. Carotid bruit is not present. No thyromegaly present.  Cardiovascular: Normal rate, regular rhythm, normal heart sounds and intact distal pulses. Exam reveals no gallop.  Pulmonary/Chest: Effort normal  and breath sounds normal. No respiratory distress. He has no wheezes. He exhibits no tenderness.  Abdominal: Soft. Bowel sounds are normal. He exhibits no distension, no abdominal bruit and no mass. There is no tenderness.  Musculoskeletal: He exhibits no edema or  tenderness.  Lymphadenopathy:    He has no cervical adenopathy.  Neurological: He is alert. He has normal reflexes. No cranial nerve deficit. He exhibits normal muscle tone. Coordination normal.  Skin: Skin is warm and dry. No rash noted. No erythema. No pallor.  Tanned with lentigines diffusely  Psychiatric: He has a normal mood and affect.          Assessment & Plan:   Problem List Items Addressed This Visit      Cardiovascular and Mediastinum   Essential hypertension    bp in fair control at this time  BP Readings from Last 1 Encounters:  12/19/17 136/80   No changes needed Most recent labs reviewed  Disc lifstyle change with low sodium diet and exercise        Relevant Medications   atorvastatin (LIPITOR) 40 MG tablet   ramipril (ALTACE) 10 MG capsule     Musculoskeletal and Integument   Sebaceous cyst    On upper back  Stable Did go to derm/did not remove it  No longer infected         Other   Hyperlipidemia    Disc goals for lipids and reasons to control them Rev last labs with pt Rev low sat fat diet in detail Trig up from vacation eating  Continue statin and diet  In setting of CAD      Relevant Medications   atorvastatin (LIPITOR) 40 MG tablet   ramipril (ALTACE) 10 MG capsule   Prostate cancer screening    Lab Results  Component Value Date   PSA 2.25 12/12/2017   PSA 2.22 12/01/2016   PSA 1.84 11/03/2015   No symptoms or fam hx Continue to follow       Routine general medical examination at a health care facility - Primary    Reviewed health habits including diet and exercise and skin cancer prevention Reviewed appropriate screening tests for age  Also reviewed health mt list,  fam hx and immunization status , as well as social and family history   amw rev Labs rev  Doing well  Interested in shingrix vaccine-enc to get on wait list Enc reg exercise

## 2017-12-19 NOTE — Patient Instructions (Addendum)
If you are interested in the new shingles vaccine (Shingrix) - call your local pharmacy to check on coverage and availability  You can get on a wait list at your pharmacy  Keep taking good care of yourself   Avoid red meat/ fried foods/ egg yolks/ fatty breakfast meats/ butter, cheese and high fat dairy/ and shellfish    Stay active-physically and mentally

## 2017-12-19 NOTE — Assessment & Plan Note (Signed)
Disc goals for lipids and reasons to control them Rev last labs with pt Rev low sat fat diet in detail Trig up from vacation eating  Continue statin and diet  In setting of CAD

## 2017-12-19 NOTE — Assessment & Plan Note (Signed)
On upper back  Stable Did go to derm/did not remove it  No longer infected

## 2017-12-19 NOTE — Assessment & Plan Note (Signed)
bp in fair control at this time  BP Readings from Last 1 Encounters:  12/19/17 136/80   No changes needed Most recent labs reviewed  Disc lifstyle change with low sodium diet and exercise

## 2017-12-19 NOTE — Assessment & Plan Note (Signed)
Lab Results  Component Value Date   PSA 2.25 12/12/2017   PSA 2.22 12/01/2016   PSA 1.84 11/03/2015   No symptoms or fam hx Continue to follow

## 2017-12-19 NOTE — Assessment & Plan Note (Signed)
Reviewed health habits including diet and exercise and skin cancer prevention Reviewed appropriate screening tests for age  Also reviewed health mt list, fam hx and immunization status , as well as social and family history   amw rev Labs rev  Doing well  Interested in shingrix vaccine-enc to get on wait list Enc reg exercise

## 2018-01-05 ENCOUNTER — Encounter (HOSPITAL_COMMUNITY): Payer: Self-pay | Admitting: Emergency Medicine

## 2018-01-05 ENCOUNTER — Ambulatory Visit (HOSPITAL_COMMUNITY)
Admission: EM | Admit: 2018-01-05 | Discharge: 2018-01-05 | Disposition: A | Discharge status: Home / Self Care | Payer: Medicare HMO | Attending: Family Medicine | Admitting: Family Medicine

## 2018-01-05 DIAGNOSIS — L089 Local infection of the skin and subcutaneous tissue, unspecified: Secondary | ICD-10-CM

## 2018-01-05 DIAGNOSIS — S50811A Abrasion of right forearm, initial encounter: Secondary | ICD-10-CM

## 2018-01-05 MED ORDER — CEPHALEXIN 500 MG PO CAPS: 500.0000 mg | ORAL_CAPSULE | Freq: Four times a day (QID) | ORAL | 0 refills | Status: AC

## 2018-01-05 NOTE — Discharge Instructions (Addendum)
It was nice seeing you today. Please keep wound area clean and dry. Use antibiotic as prescribed. F/U with PCP soon.  Note that your BP is elevated. See PCP for management.

## 2018-01-05 NOTE — ED Triage Notes (Signed)
Pt has scrap to R forearm, pt c/o happening on Monday, states its been draining ever since. Tetanus up to date.

## 2018-01-05 NOTE — ED Provider Notes (Signed)
[facility]    CSN: [id number] Arrival date & time: 01/05/18  [place]     History   Chief Complaint Chief Complaint  Patient presents with  . Abrasion    HPI Brian Todd is a 72 y.o. male.   The history is provided by the patient. No language interpreter was used.  Abrasion: He scrapped his right forearm against a wooden surface at the laudry area 5 days ago, since then it has been oozing yellowish fluid and the base of it looks red. He denies pain. He has been applying topical antibiotic ointment. No fever. HTN: On meds. Past Medical History:  Diagnosis Date  . Colitis   . Coronary artery disease   . DJD (degenerative joint disease)   . Gallbladder disease   . GERD (gastroesophageal reflux disease)   . Hearing loss   . Hyperlipidemia   . Hypertension   . IBS (irritable bowel syndrome)   . Increased prostate specific antigen (PSA) velocity     Patient Active Problem List   Diagnosis Date Noted  . Sebaceous cyst 03/28/2017  . Prostate cancer screening 11/26/2016  . Routine general medical examination at a health care facility 11/17/2015  . Low back pain 07/10/2015  . Coronary artery disease involving native coronary artery without angina pectoris 04/09/2015  . History of heart artery stent 01/01/2015  . History of colitis 11/08/2014  . Encounter for Medicare annual wellness exam 10/31/2014  . DEGENERATIVE JOINT DISEASE 07/30/2009  . HEARING LOSS, MILD 11/27/2007  . IRRITABLE BOWEL SYNDROME 11/27/2007  . GALLBLADDER DISEASE 11/27/2007  . Hyperlipidemia 08/13/2007  . Essential hypertension 08/13/2007  . GERD 08/13/2007    Past Surgical History:  Procedure Laterality Date  . CARDIAC CATHETERIZATION    . CARDIAC CATHETERIZATION N/A 01/07/2015   Procedure: Left Heart Cath and Coronary Angiography;  Surgeon: [name], MD;  Location: [facility];  Service: Cardiovascular;  Laterality: N/A;  . CARDIAC CATHETERIZATION N/A 01/07/2015   Procedure: Coronary Stent Intervention;  Surgeon: [name], MD;  Location: [facility];  Service: Cardiovascular;  Laterality: N/A;  . COLONOSCOPY    . CORONARY STENT PLACEMENT     Dr. [name]  . CORONARY STENT PLACEMENT  01/07/2015   LAD  . TONSILLECTOMY AND ADENOIDECTOMY     in the 2nd grade       Home Medications    Prior to Admission medications   Medication Sig Start Date End Date Taking? Authorizing Provider  aspirin EC 81 MG tablet Take 1 tablet (81 mg total) by mouth daily. 01/01/15   Lucille Passy, MD  atorvastatin (LIPITOR) 40 MG tablet Take 1 tablet (40 mg total) by mouth daily. 12/19/17   Tower, Wynelle Fanny, MD  clopidogrel (PLAVIX) 75 MG tablet TAKE 1 TABLET EVERY DAY 03/22/17   [name], MD  metoprolol tartrate (LOPRESSOR) 25 MG tablet Take 1 tablet (25 mg total) by mouth 2 (two) times daily. 11/30/17   [name], MD  Multiple Vitamins-Minerals (MULTIVITAMIN & MINERAL PO) Take 1 tablet by mouth daily.    [provider]  nitroGLYCERIN (NITROSTAT) 0.4 MG SL tablet Place 1 tablet (0.4 mg total) under the tongue every 5 (five) minutes as needed for chest pain. 12/01/16   [name], MD  Omega-3 Krill Oil 500 MG CAPS Take 1 capsule by mouth daily.    [provider]  pantoprazole (PROTONIX) 40 MG tablet Take 1 tablet (40 mg total) by mouth  daily. 12/19/17   Tower, Wynelle Fanny, MD  ramipril (ALTACE) 10 MG capsule TAKE 1 CAPSULE BY MOUTH EVERY DAY 12/19/17   Tower, Wynelle Fanny, MD    Family History Family History  Problem Relation Age of Onset  . Stroke Mother     Social History Social History   Tobacco Use  . Smoking status: Never Smoker  . Smokeless tobacco: Never Used  Substance Use Topics  . Alcohol use: Yes    Alcohol/week: 0.0 oz    Comment: rarely drinks beer/approx. 1 case per year  . Drug use: No     Allergies   Flagyl [metronidazole]   Review of Systems Review of Systems  Respiratory: Negative.     Cardiovascular: Negative.   Skin: Positive for wound.  All other systems reviewed and are negative.    Physical Exam Triage Vital Signs ED Triage Vitals [01/05/18 1732]  Enc Vitals Group     BP (!) 152/69     Pulse Rate (!) 57     Resp 16     Temp 98.2 F (36.8 C)     Temp src      SpO2 100 %     Weight      Height      Head Circumference      Peak Flow      Pain Score      Pain Loc      Pain Edu?      Excl. in [place]?    No data found.  Updated Vital Signs BP (!) 152/69   Pulse (!) 57   Temp 98.2 F (36.8 C)   Resp 16   SpO2 100%   Visual Acuity Right Eye Distance:   Left Eye Distance:   Bilateral Distance:    Right Eye Near:   Left Eye Near:    Bilateral Near:     Physical Exam  Constitutional: He appears well-developed. No distress.  Cardiovascular: Normal rate, regular rhythm, normal heart sounds and intact distal pulses.  No murmur heard. Pulmonary/Chest: Effort normal and breath sounds normal. No stridor. No respiratory distress. He has no wheezes.  Skin:     Nursing note and vitals reviewed.    UC Treatments / Results  Labs (all labs ordered are listed, but only abnormal results are displayed) Labs Reviewed - No data to display  EKG None  Radiology No results found.  Procedures Procedures (including critical care time)  Medications Ordered in UC Medications - No data to display  Initial Impression / Assessment and Plan / UC Course  I have reviewed the triage vital signs and the nursing notes.  Pertinent labs & imaging results that were available during my care of the patient were reviewed by me and considered in my medical decision making (see chart for details).  Clinical Course as of Jan 05 1809  Fri Jan 05, 2018  1809 Likely mild superimposed bacterial infection from excessive touch. He confirmed that his tetanus shot is up to date. Will treat with Keflex. See PCP back soon if it worsens.   [KE]    Clinical Course User  Index [KE] Kinnie Feil, MD    Infected abrasion of right forearm, initial encounter   Final Clinical Impressions(s) / UC Diagnoses   Final diagnoses:  None   Discharge Instructions   None    ED Prescriptions    None     Controlled Substance Prescriptions [place] Controlled Substance Registry consulted? Not Applicable   Andrena Mews  T, MD 01/05/18 1810

## 2018-01-08 ENCOUNTER — Encounter: Payer: Self-pay | Admitting: Family Medicine

## 2018-01-08 ENCOUNTER — Ambulatory Visit (INDEPENDENT_AMBULATORY_CARE_PROVIDER_SITE_OTHER): Payer: Medicare HMO | Admitting: Family Medicine

## 2018-01-08 VITALS — BP 134/60 | HR 54 | Ht 67.5 in | Wt 203.2 lb

## 2018-01-08 DIAGNOSIS — S50811D Abrasion of right forearm, subsequent encounter: Secondary | ICD-10-CM

## 2018-01-08 DIAGNOSIS — L089 Local infection of the skin and subcutaneous tissue, unspecified: Secondary | ICD-10-CM

## 2018-01-08 NOTE — Patient Instructions (Signed)
Good to see you today  Please let us know if any worsening of your arm- increasing size or redness, fever or chills  It is important to finish all of your antibiotic.

## 2018-01-08 NOTE — Progress Notes (Signed)
   Subjective:    Patient ID: Brian Todd, male    DOB: 1946-04-14, 71 y.o.   MRN: [id number]  HPI This is a 72 yo male who presents today with abraded area on left forearm. Scraped arm on shelf about a week ago and noticed it was getting redder. Applied neopsorin. Has been cleaning with soap and water. Went to urgent care 3 days ago and was put on keflex 500 mg qid. Has continued to have clear drainage. Has started to dry up. Area mildly itchy. No fever, feels otherwise fine. Thinks it is improving, but has noticed small bumps.   Past Medical History:  Diagnosis Date  . Colitis   . Coronary artery disease   . DJD (degenerative joint disease)   . Gallbladder disease   . GERD (gastroesophageal reflux disease)   . Hearing loss   . Hyperlipidemia   . Hypertension   . IBS (irritable bowel syndrome)   . Increased prostate specific antigen (PSA) velocity    Past Surgical History:  Procedure Laterality Date  . CARDIAC CATHETERIZATION    . CARDIAC CATHETERIZATION N/A 01/07/2015   Procedure: Left Heart Cath and Coronary Angiography;  Surgeon: [name], MD;  Location: [facility];  Service: Cardiovascular;  Laterality: N/A;  . CARDIAC CATHETERIZATION N/A 01/07/2015   Procedure: Coronary Stent Intervention;  Surgeon: [name], MD;  Location: [facility];  Service: Cardiovascular;  Laterality: N/A;  . COLONOSCOPY    . CORONARY STENT PLACEMENT     Dr. [name]  . CORONARY STENT PLACEMENT  01/07/2015   LAD  . TONSILLECTOMY AND ADENOIDECTOMY     in the 2nd grade   Family History  Problem Relation Age of Onset  . Stroke Mother    Social History   Tobacco Use  . Smoking status: Never Smoker  . Smokeless tobacco: Never Used  Substance Use Topics  . Alcohol use: Yes    Alcohol/week: 0.0 oz    Comment: rarely drinks beer/approx. 1 case per year  . Drug use: No      Review of Systems Per HPI    Objective:   Physical Exam  Constitutional: He appears  well-developed and well-nourished. No distress.  HENT:  Head: Normocephalic and atraumatic.  Cardiovascular: Normal rate.  Pulmonary/Chest: Effort normal.  Skin: Skin is warm and dry. He is not diaphoretic.  Right forearm with 9 cm x 3 cm area erythema with papules and resolving vesicles.   Psychiatric: He has a normal mood and affect. His behavior is normal. Judgment and thought content normal.  Vitals reviewed.       BP 134/60 (BP Location: Left Arm, Patient Position: Sitting, Cuff Size: Large)   Pulse (!) 54   Ht 5' 7.5" (1.715 m)   Wt 203 lb 4 oz (92.2 kg)   SpO2 99%   BMI 31.36 kg/m      Assessment & Plan:  1. Infected abrasion of forearm, right, subsequent encounter - no worrisome findings on exam or history - encouraged patient to continue cleaning with mild soap and water, finish antibiotic, leave open to air as much as possible - RTC precautions reviewed- fever/chills, worsening redness or drainage   Clarene Reamer, FNP-BC  [facility], [place]  01/08/2018 8:18 AM

## 2018-01-18 DIAGNOSIS — L02212 Cutaneous abscess of back [any part, except buttock]: Secondary | ICD-10-CM | POA: Diagnosis not present

## 2018-01-18 DIAGNOSIS — L0889 Other specified local infections of the skin and subcutaneous tissue: Secondary | ICD-10-CM | POA: Diagnosis not present

## 2018-01-23 DIAGNOSIS — H73891 Other specified disorders of tympanic membrane, right ear: Secondary | ICD-10-CM | POA: Diagnosis not present

## 2018-01-23 DIAGNOSIS — H903 Sensorineural hearing loss, bilateral: Secondary | ICD-10-CM | POA: Diagnosis not present

## 2018-01-23 DIAGNOSIS — H9311 Tinnitus, right ear: Secondary | ICD-10-CM | POA: Diagnosis not present

## 2018-01-23 DIAGNOSIS — H6981 Other specified disorders of Eustachian tube, right ear: Secondary | ICD-10-CM | POA: Diagnosis not present

## 2018-03-06 DIAGNOSIS — H5203 Hypermetropia, bilateral: Secondary | ICD-10-CM | POA: Diagnosis not present

## 2018-03-06 DIAGNOSIS — H524 Presbyopia: Secondary | ICD-10-CM | POA: Diagnosis not present

## 2018-03-06 DIAGNOSIS — H5213 Myopia, bilateral: Secondary | ICD-10-CM | POA: Diagnosis not present

## 2018-03-06 DIAGNOSIS — Z0100 Encounter for examination of eyes and vision without abnormal findings: Secondary | ICD-10-CM | POA: Diagnosis not present

## 2018-03-20 ENCOUNTER — Other Ambulatory Visit: Payer: Self-pay | Admitting: Cardiovascular Disease

## 2018-04-11 DIAGNOSIS — R69 Illness, unspecified: Secondary | ICD-10-CM | POA: Diagnosis not present

## 2018-04-23 DIAGNOSIS — R69 Illness, unspecified: Secondary | ICD-10-CM | POA: Diagnosis not present

## 2018-05-07 DIAGNOSIS — L720 Epidermal cyst: Secondary | ICD-10-CM | POA: Diagnosis not present

## 2018-05-10 DIAGNOSIS — H9311 Tinnitus, right ear: Secondary | ICD-10-CM | POA: Diagnosis not present

## 2018-05-10 DIAGNOSIS — H6981 Other specified disorders of Eustachian tube, right ear: Secondary | ICD-10-CM | POA: Diagnosis not present

## 2018-05-10 DIAGNOSIS — H6123 Impacted cerumen, bilateral: Secondary | ICD-10-CM | POA: Diagnosis not present

## 2018-05-10 DIAGNOSIS — H903 Sensorineural hearing loss, bilateral: Secondary | ICD-10-CM | POA: Diagnosis not present

## 2018-05-10 DIAGNOSIS — H73891 Other specified disorders of tympanic membrane, right ear: Secondary | ICD-10-CM | POA: Diagnosis not present

## 2018-08-16 IMAGING — DX DG HIP (WITH OR WITHOUT PELVIS) 2-3V*R*
3 series · 3 of 3 positions shown · non-contrast
Comparison: None.

CLINICAL DATA: 71 y/o M; right lateral hip pain radiating to the
anterior thigh.

EXAM:
DG HIP (WITH OR WITHOUT PELVIS) 2-3V RIGHT

[pelvis ap]
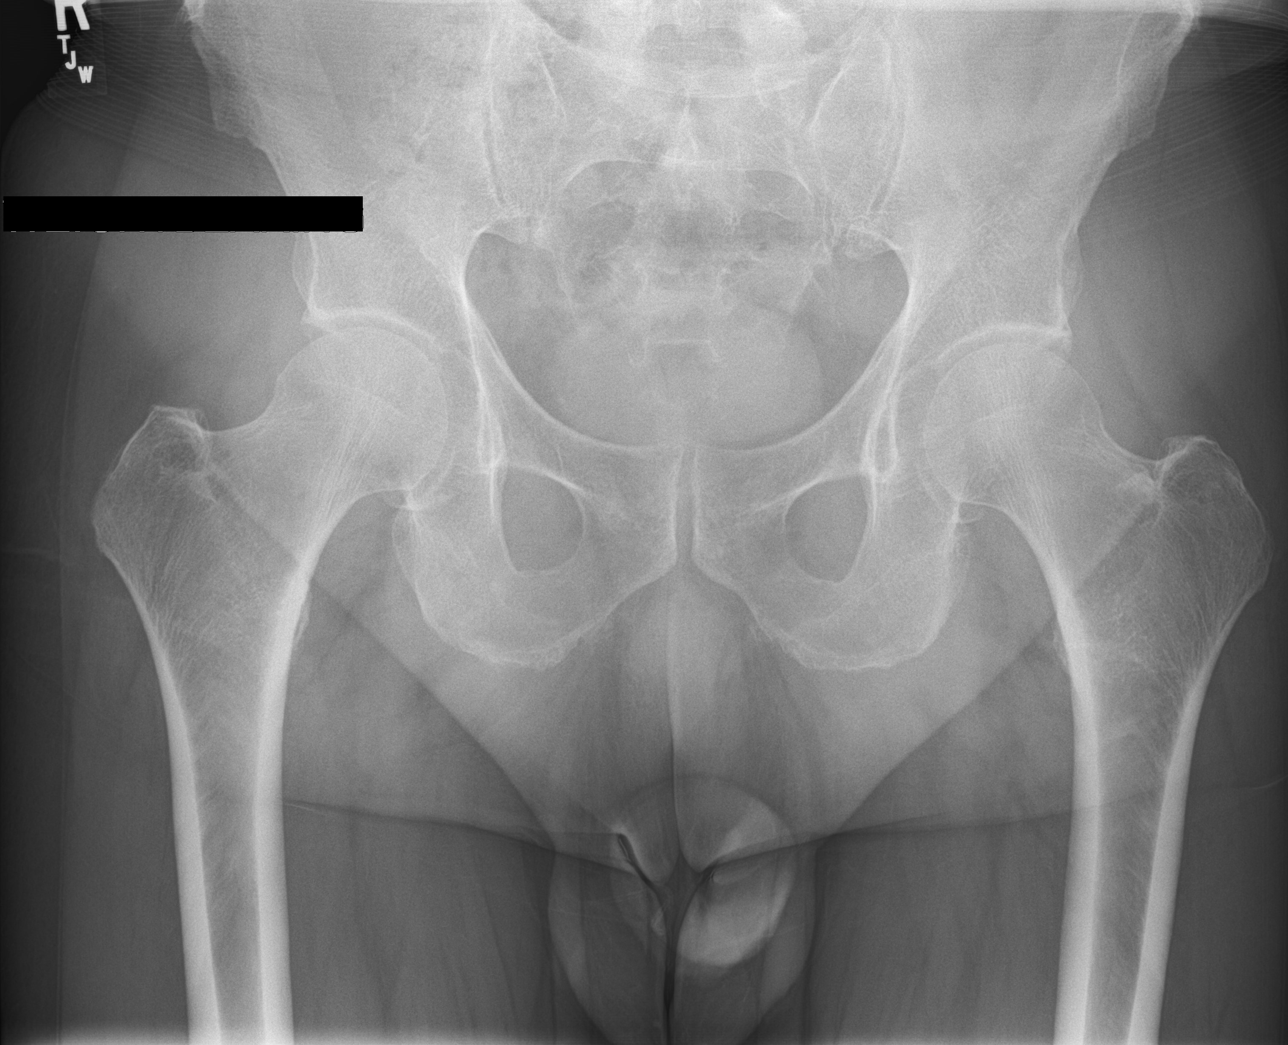

[hip ap]
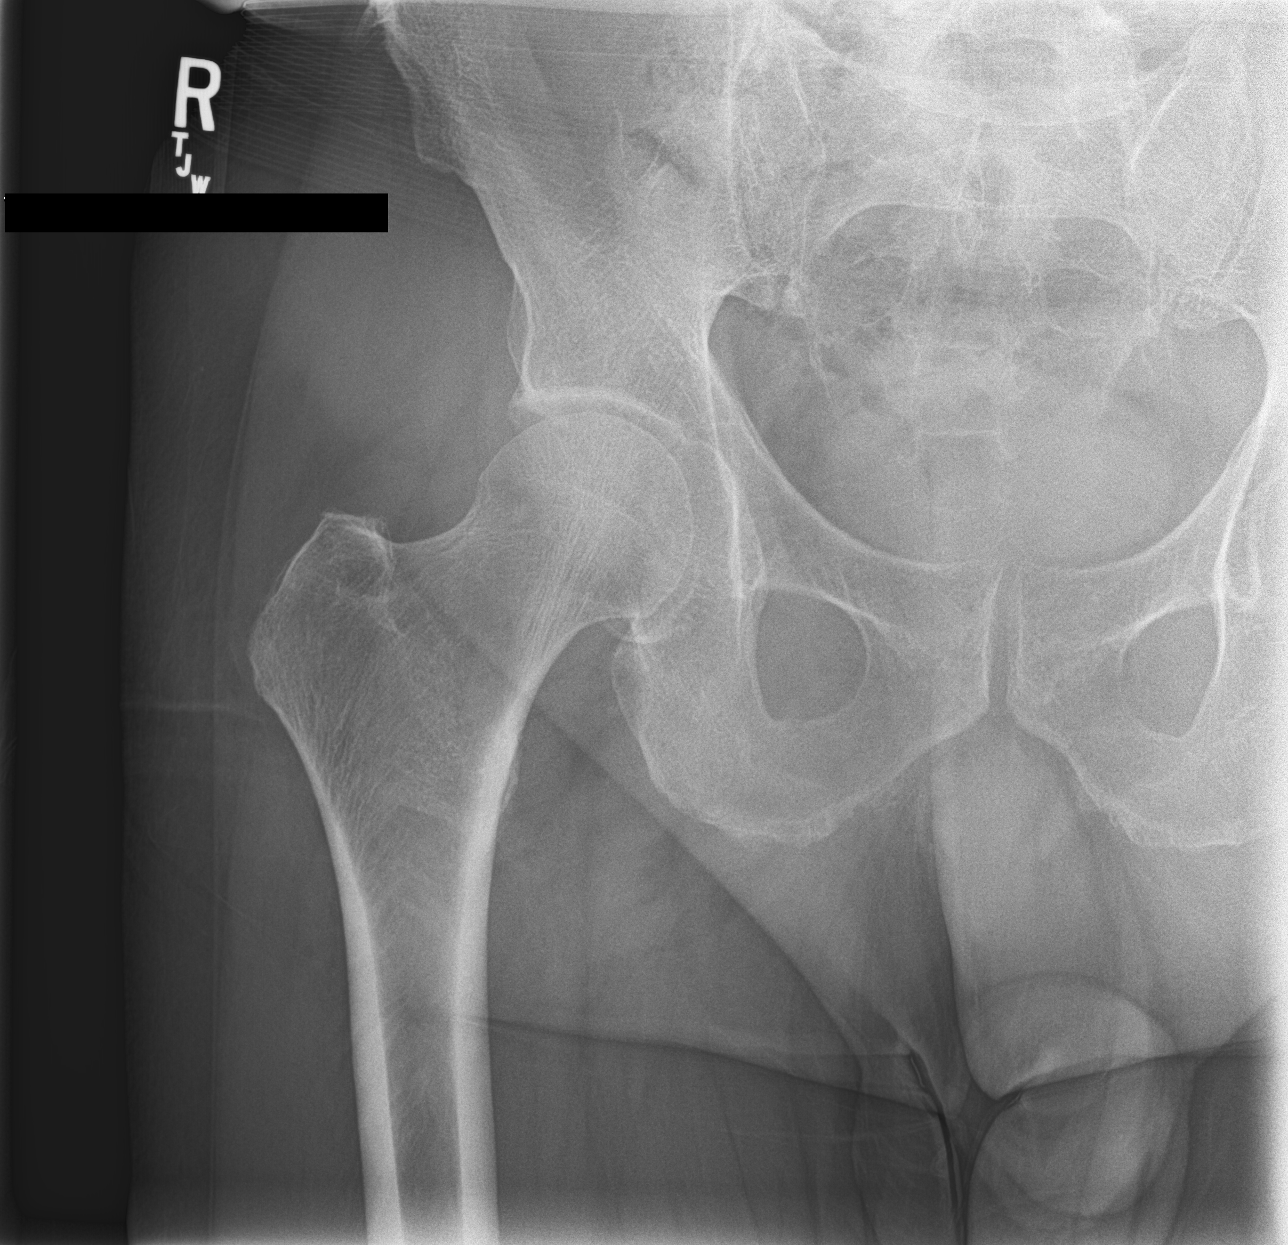

[hip lat]
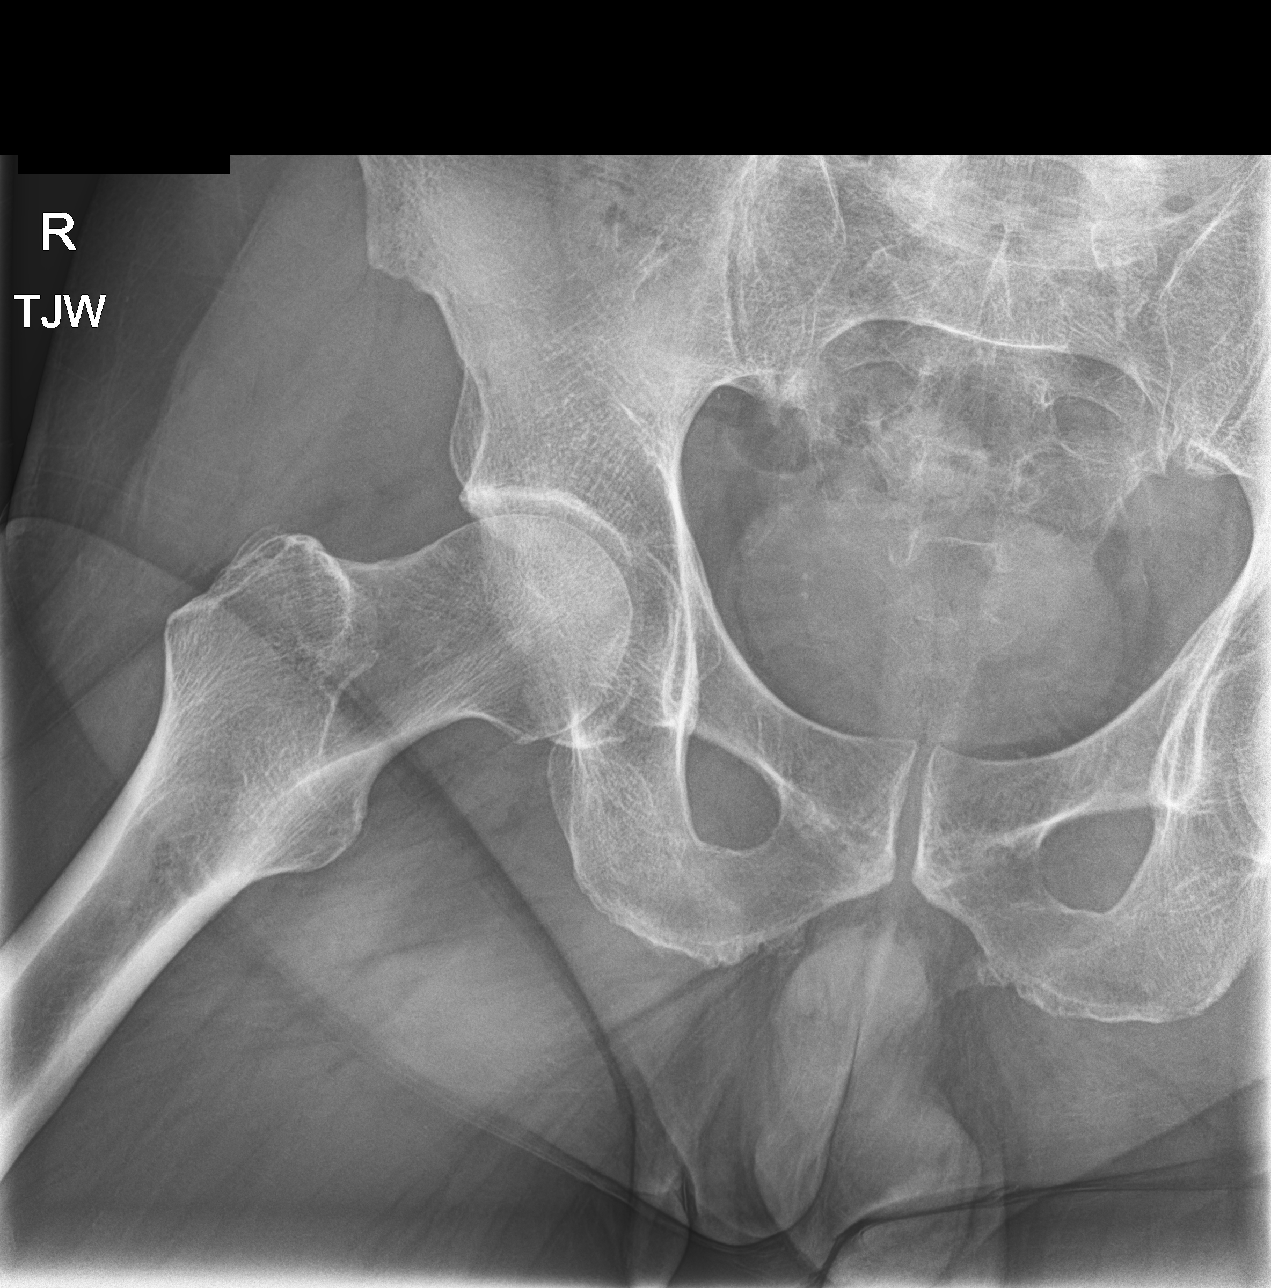

[3 of 3 positions shown; findings below may reference images not displayed]

FINDINGS: No acute fracture or dislocation. Symphysis pubis and sacroiliac
joints are well maintained. Minimal osteoarthrosis of the hip joints
with productive changes of the superolateral acetabular rims.
IMPRESSION: No acute fracture or dislocation. Minimal osteoarthrosis of the hip
joints.

By: Rehabilitace Prace M.D.

## 2018-08-21 DIAGNOSIS — L814 Other melanin hyperpigmentation: Secondary | ICD-10-CM | POA: Diagnosis not present

## 2018-08-21 DIAGNOSIS — L570 Actinic keratosis: Secondary | ICD-10-CM | POA: Diagnosis not present

## 2018-08-21 DIAGNOSIS — L738 Other specified follicular disorders: Secondary | ICD-10-CM | POA: Diagnosis not present

## 2018-08-21 DIAGNOSIS — L720 Epidermal cyst: Secondary | ICD-10-CM | POA: Diagnosis not present

## 2018-08-21 DIAGNOSIS — D225 Melanocytic nevi of trunk: Secondary | ICD-10-CM | POA: Diagnosis not present

## 2018-08-23 DIAGNOSIS — H73891 Other specified disorders of tympanic membrane, right ear: Secondary | ICD-10-CM | POA: Diagnosis not present

## 2018-08-23 DIAGNOSIS — H9311 Tinnitus, right ear: Secondary | ICD-10-CM | POA: Diagnosis not present

## 2018-08-23 DIAGNOSIS — H6981 Other specified disorders of Eustachian tube, right ear: Secondary | ICD-10-CM | POA: Diagnosis not present

## 2018-08-23 DIAGNOSIS — H903 Sensorineural hearing loss, bilateral: Secondary | ICD-10-CM | POA: Diagnosis not present

## 2018-08-23 DIAGNOSIS — H6123 Impacted cerumen, bilateral: Secondary | ICD-10-CM | POA: Diagnosis not present

## 2018-09-27 ENCOUNTER — Other Ambulatory Visit: Payer: Self-pay

## 2018-09-27 ENCOUNTER — Emergency Department
Admission: EM | Admit: 2018-09-27 | Discharge: 2018-09-28 | Disposition: A | Discharge status: Home / Self Care | Payer: Medicare HMO | Attending: Emergency Medicine | Admitting: Emergency Medicine

## 2018-09-27 ENCOUNTER — Emergency Department: Payer: Medicare HMO

## 2018-09-27 ENCOUNTER — Encounter: Payer: Self-pay | Admitting: Emergency Medicine

## 2018-09-27 DIAGNOSIS — Z7902 Long term (current) use of antithrombotics/antiplatelets: Secondary | ICD-10-CM | POA: Diagnosis not present

## 2018-09-27 DIAGNOSIS — I259 Chronic ischemic heart disease, unspecified: Secondary | ICD-10-CM | POA: Diagnosis not present

## 2018-09-27 DIAGNOSIS — R339 Retention of urine, unspecified: Secondary | ICD-10-CM | POA: Insufficient documentation

## 2018-09-27 DIAGNOSIS — Z79899 Other long term (current) drug therapy: Secondary | ICD-10-CM | POA: Diagnosis not present

## 2018-09-27 DIAGNOSIS — K5900 Constipation, unspecified: Secondary | ICD-10-CM | POA: Insufficient documentation

## 2018-09-27 DIAGNOSIS — Z7982 Long term (current) use of aspirin: Secondary | ICD-10-CM | POA: Diagnosis not present

## 2018-09-27 DIAGNOSIS — K449 Diaphragmatic hernia without obstruction or gangrene: Secondary | ICD-10-CM | POA: Diagnosis not present

## 2018-09-27 DIAGNOSIS — I10 Essential (primary) hypertension: Secondary | ICD-10-CM | POA: Diagnosis not present

## 2018-09-27 LAB — CBC WITH DIFFERENTIAL/PLATELET
Abs Immature Granulocytes: 0.08 10*3/uL — ABNORMAL HIGH (ref 0.00–0.07)
BASOS ABS: 0 10*3/uL (ref 0.0–0.1)
BASOS PCT: 0 %
EOS PCT: 0 %
Eosinophils Absolute: 0 10*3/uL (ref 0.0–0.5)
HCT: 47.3 % (ref 39.0–52.0)
HEMOGLOBIN: 15.6 g/dL (ref 13.0–17.0)
Immature Granulocytes: 0 %
LYMPHS PCT: 3 %
Lymphs Abs: 0.6 10*3/uL — ABNORMAL LOW (ref 0.7–4.0)
MCH: 30.3 pg (ref 26.0–34.0)
MCHC: 33 g/dL (ref 30.0–36.0)
MCV: 91.8 fL (ref 80.0–100.0)
Monocytes Absolute: 1.3 10*3/uL — ABNORMAL HIGH (ref 0.1–1.0)
Monocytes Relative: 6 %
NEUTROS ABS: 18.8 10*3/uL — AB (ref 1.7–7.7)
Neutrophils Relative %: 91 %
PLATELETS: 206 10*3/uL (ref 150–400)
RBC: 5.15 MIL/uL (ref 4.22–5.81)
RDW: 12.1 % (ref 11.5–15.5)
WBC: 20.9 10*3/uL — AB (ref 4.0–10.5)
nRBC: 0 % (ref 0.0–0.2)

## 2018-09-27 LAB — URINALYSIS, COMPLETE (UACMP) WITH MICROSCOPIC
Bilirubin Urine: NEGATIVE
GLUCOSE, UA: NEGATIVE mg/dL
KETONES UR: NEGATIVE mg/dL
Leukocytes,Ua: NEGATIVE
NITRITE: NEGATIVE
PH: 5 (ref 5.0–8.0)
Protein, ur: NEGATIVE mg/dL
SPECIFIC GRAVITY, URINE: 1.019 (ref 1.005–1.030)
Squamous Epithelial / LPF: NONE SEEN (ref 0–5)

## 2018-09-27 LAB — COMPREHENSIVE METABOLIC PANEL
ALBUMIN: 4.4 g/dL (ref 3.5–5.0)
ALK PHOS: 93 U/L (ref 38–126)
ALT: 25 U/L (ref 0–44)
AST: 28 U/L (ref 15–41)
Anion gap: 7 (ref 5–15)
BILIRUBIN TOTAL: 0.9 mg/dL (ref 0.3–1.2)
BUN: 23 mg/dL (ref 8–23)
CALCIUM: 8.7 mg/dL — AB (ref 8.9–10.3)
CO2: 23 mmol/L (ref 22–32)
CREATININE: 0.99 mg/dL (ref 0.61–1.24)
Chloride: 108 mmol/L (ref 98–111)
GFR calc Af Amer: >60 mL/min (ref >60)
Glucose, Bld: 152 mg/dL — ABNORMAL HIGH (ref 70–99)
POTASSIUM: 4.1 mmol/L (ref 3.5–5.1)
Sodium: 138 mmol/L (ref 135–145)
TOTAL PROTEIN: 7.4 g/dL (ref 6.5–8.1)

## 2018-09-27 LAB — LIPASE, BLOOD: LIPASE: 29 U/L (ref 11–51)

## 2018-09-27 MED ORDER — IOPAMIDOL (ISOVUE-300) INJECTION 61%
30.0000 mL | Freq: Once | INTRAVENOUS | Status: AC
  Administered 2018-09-27: 30 mL via ORAL

## 2018-09-27 MED ORDER — POLYETHYLENE GLYCOL 3350 17 G PO PACK: 17.0000 g | PACK | Freq: Every day | ORAL | 0 refills | Status: AC

## 2018-09-27 MED ORDER — IOHEXOL 300 MG/ML  SOLN
100.0000 mL | Freq: Once | INTRAMUSCULAR | Status: AC | PRN
  Administered 2018-09-27: 100 mL via INTRAVENOUS

## 2018-09-27 NOTE — Discharge Instructions (Signed)
You were constipated today, and your white count was elevated.  CT scan showed some evidence of inflammation in your colon.  You absolutely need to follow-up with your primary care doctor and the GI doctor you saw before or the one listed above in the next few days for a outpatient colonoscopy to make sure that there is nothing more serious going on.  We do not know exactly why you have this inflammation.  There is a range of things I can cause it some of which are quite serious and it is important to follow-up.  In the meantime, take the MiraLAX as prescribed, and if you have abdominal pain fever vomiting or other concerns return to the ER.

## 2018-09-27 NOTE — ED Notes (Signed)
Bladder scan completed. Highest bladder scan result was 251 mL. MD notified.

## 2018-09-27 NOTE — ED Notes (Signed)
Urine sample sent to lab if needed.  

## 2018-09-27 NOTE — ED Notes (Signed)
Spoke with MD Corky Downs about pt , in and out cath only at this time

## 2018-09-27 NOTE — ED Provider Notes (Addendum)
[facility] Emergency Department Provider Note  ____________________________________________   I have reviewed the triage vital signs and the nursing notes. Where available I have reviewed prior notes and, if possible and indicated, outside hospital notes.    HISTORY  Chief Complaint Urinary Retention    HPI Brian Todd is a 73 y.o. male  History of colitis, gallbladder disease in the past reflux disease, I will bowel syndrome states his been constipated for 3 days, he tried give himself an enema.  He states today after 3 days of constipation, he started having urinary retention, he had a in and out catheter in the triage room which resulted in 400 cc of urine.  He states he feels better.  He feels that his urine is starting to reaccumulate.  He states he did take a home enema with minimal results.  Does have a history of prior constipation.  He denies any significant abdominal pain.  He feels that he is having trouble urinating because of the constipation if he could move his bowels then he would feel better in terms of his urine.  No fever no numbness no weakness no trauma no vomiting no dysuria  No other alleviating or aggravating factors no other complaints.   Past Medical History:  Diagnosis Date  . Colitis   . Coronary artery disease   . DJD (degenerative joint disease)   . Gallbladder disease   . GERD (gastroesophageal reflux disease)   . Hearing loss   . Hyperlipidemia   . Hypertension   . IBS (irritable bowel syndrome)   . Increased prostate specific antigen (PSA) velocity     Patient Active Problem List   Diagnosis Date Noted  . Sebaceous cyst 03/28/2017  . Prostate cancer screening 11/26/2016  . Routine general medical examination at a health care facility 11/17/2015  . Low back pain 07/10/2015  . Coronary artery disease involving native coronary artery without angina pectoris 04/09/2015  . History of heart artery stent 01/01/2015  .  History of colitis 11/08/2014  . Encounter for Medicare annual wellness exam 10/31/2014  . DEGENERATIVE JOINT DISEASE 07/30/2009  . HEARING LOSS, MILD 11/27/2007  . IRRITABLE BOWEL SYNDROME 11/27/2007  . GALLBLADDER DISEASE 11/27/2007  . Hyperlipidemia 08/13/2007  . Essential hypertension 08/13/2007  . GERD 08/13/2007    Past Surgical History:  Procedure Laterality Date  . CARDIAC CATHETERIZATION    . CARDIAC CATHETERIZATION N/A 01/07/2015   Procedure: Left Heart Cath and Coronary Angiography;  Surgeon: [name], MD;  Location: [facility];  Service: Cardiovascular;  Laterality: N/A;  . CARDIAC CATHETERIZATION N/A 01/07/2015   Procedure: Coronary Stent Intervention;  Surgeon: [name], MD;  Location: [facility];  Service: Cardiovascular;  Laterality: N/A;  . COLONOSCOPY    . CORONARY STENT PLACEMENT     Dr. [name]  . CORONARY STENT PLACEMENT  01/07/2015   LAD  . TONSILLECTOMY AND ADENOIDECTOMY     in the 2nd grade    Prior to Admission medications   Medication Sig Start Date End Date Taking? Authorizing Provider  aspirin EC 81 MG tablet Take 1 tablet (81 mg total) by mouth daily. 01/01/15   Lucille Passy, MD  atorvastatin (LIPITOR) 40 MG tablet Take 1 tablet (40 mg total) by mouth daily. 12/19/17   Tower, Wynelle Fanny, MD  clopidogrel (PLAVIX) 75 MG tablet TAKE 1 TABLET EVERY DAY 03/20/18   [name], MD  metoprolol tartrate (LOPRESSOR) 25 MG tablet Take  1 tablet (25 mg total) by mouth 2 (two) times daily. 11/30/17   [name], MD  Multiple Vitamins-Minerals (MULTIVITAMIN & MINERAL PO) Take 1 tablet by mouth daily.    [provider]  nitroGLYCERIN (NITROSTAT) 0.4 MG SL tablet Place 1 tablet (0.4 mg total) under the tongue every 5 (five) minutes as needed for chest pain. 12/01/16   [name], MD  Omega-3 Krill Oil 500 MG CAPS Take 1 capsule by mouth daily.    [provider]  pantoprazole (PROTONIX) 40 MG tablet Take 1  tablet (40 mg total) by mouth daily. 12/19/17   Tower, Wynelle Fanny, MD  ramipril (ALTACE) 10 MG capsule TAKE 1 CAPSULE BY MOUTH EVERY DAY 12/19/17   Tower, Wynelle Fanny, MD    Allergies Flagyl [metronidazole]  Family History  Problem Relation Age of Onset  . Stroke Mother     Social History Social History   Tobacco Use  . Smoking status: Never Smoker  . Smokeless tobacco: Never Used  Substance Use Topics  . Alcohol use: Yes    Alcohol/week: 0.0 standard drinks    Comment: rarely drinks beer/approx. 1 case per year  . Drug use: No    Review of Systems Constitutional: No fever/chills Eyes: No visual changes. ENT: No sore throat. No stiff neck no neck pain Cardiovascular: Denies chest pain. Respiratory: Denies shortness of breath. Gastrointestinal:   no vomiting.  No diarrhea.  + constipation. Genitourinary: Negative for dysuria. Musculoskeletal: Negative lower extremity swelling Skin: Negative for rash. Neurological: Negative for severe headaches, focal weakness or numbness.   ____________________________________________   PHYSICAL EXAM:  VITAL SIGNS: ED Triage Vitals  Enc Vitals Group     BP 09/27/18 1818 (!) 185/94     Pulse Rate 09/27/18 1818 (!) 105     Resp 09/27/18 1818 16     Temp 09/27/18 1818 98.4 F (36.9 C)     Temp Source 09/27/18 1818 Oral     SpO2 09/27/18 1818 98 %     Weight --      Height --      Head Circumference --      Peak Flow --      Pain Score 09/27/18 1819 10     Pain Loc --      Pain Edu? --      Excl. in [place]? --     Constitutional: Alert and oriented. Well appearing and in no acute distress. Eyes: Conjunctivae are normal Head: Atraumatic HEENT: No congestion/rhinnorhea. Mucous membranes are moist.  Oropharynx non-erythematous Neck:   Nontender with no meningismus, no masses, no stridor Cardiovascular: Normal rate, regular rhythm. Grossly normal heart sounds.  Good peripheral circulation. Respiratory: Normal respiratory effort.  No  retractions. Lungs CTAB. Abdominal: Soft and suprapubic tenderness. No distention. No guarding no rebound Back:  There is no focal tenderness or step off.  there is no midline tenderness there are no lesions noted. there is no CVA tenderness Musculoskeletal: No lower extremity tenderness, no upper extremity tenderness. No joint effusions, no DVT signs strong distal pulses no edema Neurologic:  Normal speech and language. No gross focal neurologic deficits are appreciated.  Skin:  Skin is warm, dry and intact. No rash noted. Psychiatric: Mood and affect are normal. Speech and behavior are normal.  ____________________________________________   LABS (all labs ordered are listed, but only abnormal results are displayed)  Labs Reviewed  CBC WITH DIFFERENTIAL/PLATELET  URINALYSIS, COMPLETE (UACMP) WITH MICROSCOPIC  COMPREHENSIVE METABOLIC PANEL  LIPASE,  BLOOD    Pertinent labs  results that were available during my care of the patient were reviewed by me and considered in my medical decision making (see chart for details). ____________________________________________  EKG  I personally interpreted any EKGs ordered by me or triage  ____________________________________________  RADIOLOGY  Pertinent labs & imaging results that were available during my care of the patient were reviewed by me and considered in my medical decision making (see chart for details). If possible, patient and/or family made aware of any abnormal findings.  No results found. ____________________________________________    PROCEDURES  Procedure(s) performed: None  Procedures  Critical Care performed: None  ____________________________________________   INITIAL IMPRESSION / ASSESSMENT AND PLAN / ED COURSE  Pertinent labs & imaging results that were available during my care of the patient were reviewed by me and considered in my medical decision making (see chart for details).  With constipation but  no evidence of acute neurologic pathology also urinary retention which can happen sometimes with constipation.  However, obtain a CT scan make sure no other pathology is present, we will place a Foley while we evaluate him we will get basic blood work and reassess.  Certainly could be simple constipation however given his age I feel further work-up is advisable.  ----------------------------------------- 11:16 PM on 09/27/2018 -----------------------------------------  CT scan does not show anything that requires surgery at this time, there is no evidence of ischemic gut.  White count is elevated and there is some inflammation there.  This is very similar to what he had for years ago.  He would like an enema which we will provide he clearly is pretty constipated.  We will see if I can get him feeling better hopefully after that he will be able to urinate and we can remove the Foley.  I doubt that he will need to go home with a Foley once we get that block of fecal matter out from his bladder.  I will send a lactic as a precaution make sure there is no evidence of ischemia.  We will also attempt disimpaction prior to enema and I did stress to the patient all of his findings, and the absolute need to follow closely with GI medicine he does have a GI doctor he can follow-up with very closely at [facility] signed out to Dr. Owens Shark at the end of my shift    ____________________________________________   FINAL CLINICAL IMPRESSION(S) / ED DIAGNOSES  Final diagnoses:  None      This chart was dictated using voice recognition software.  Despite best efforts to proofread,  errors can occur which can change meaning.      Schuyler Amor, MD 09/27/18 2154    Schuyler Amor, MD 09/27/18 2317    Schuyler Amor, MD 09/27/18 2320

## 2018-09-27 NOTE — ED Notes (Signed)
Pt back to room from ct at this time . 

## 2018-09-27 NOTE — ED Triage Notes (Signed)
PT c/o lower abd pain with urinary retention, states last urinated around 1200 today. Pt appears uncomfortable . PT states he also used fleet enema at home due to pressure in rectum as well. Last normal BM x3days ago, thinks he may be constipated. Bladder scan shows aprox 469mL

## 2018-09-28 ENCOUNTER — Encounter: Payer: Self-pay | Admitting: Family Medicine

## 2018-09-28 ENCOUNTER — Ambulatory Visit (INDEPENDENT_AMBULATORY_CARE_PROVIDER_SITE_OTHER): Payer: Medicare HMO | Admitting: Family Medicine

## 2018-09-28 VITALS — BP 140/66 | HR 87 | Temp 99.0°F | Ht 67.5 in | Wt 205.6 lb

## 2018-09-28 DIAGNOSIS — Z8719 Personal history of other diseases of the digestive system: Secondary | ICD-10-CM | POA: Diagnosis not present

## 2018-09-28 DIAGNOSIS — K5900 Constipation, unspecified: Secondary | ICD-10-CM | POA: Diagnosis not present

## 2018-09-28 DIAGNOSIS — K581 Irritable bowel syndrome with constipation: Secondary | ICD-10-CM

## 2018-09-28 DIAGNOSIS — M79672 Pain in left foot: Secondary | ICD-10-CM | POA: Diagnosis not present

## 2018-09-28 DIAGNOSIS — I700 Atherosclerosis of aorta: Secondary | ICD-10-CM

## 2018-09-28 DIAGNOSIS — K529 Noninfective gastroenteritis and colitis, unspecified: Secondary | ICD-10-CM | POA: Diagnosis not present

## 2018-09-28 LAB — CBC WITH DIFFERENTIAL/PLATELET
BASOS ABS: 0 10*3/uL (ref 0.0–0.1)
Basophils Relative: 0.1 % (ref 0.0–3.0)
Eosinophils Absolute: 0 10*3/uL (ref 0.0–0.7)
Eosinophils Relative: 0.1 % (ref 0.0–5.0)
HEMATOCRIT: 44.6 % (ref 39.0–52.0)
HEMOGLOBIN: 15 g/dL (ref 13.0–17.0)
LYMPHS ABS: 2.1 10*3/uL (ref 0.7–4.0)
LYMPHS PCT: 13.7 % (ref 12.0–46.0)
MCHC: 33.7 g/dL (ref 30.0–36.0)
MCV: 92.2 fl (ref 78.0–100.0)
MONOS PCT: 11.7 % (ref 3.0–12.0)
Monocytes Absolute: 1.8 10*3/uL — ABNORMAL HIGH (ref 0.1–1.0)
Neutro Abs: 11.6 10*3/uL — ABNORMAL HIGH (ref 1.4–7.7)
Neutrophils Relative %: 74.4 % (ref 43.0–77.0)
Platelets: 223 10*3/uL (ref 150.0–400.0)
RBC: 4.84 Mil/uL (ref 4.22–5.81)
RDW: 13.3 % (ref 11.5–15.5)
WBC: 15.5 10*3/uL — ABNORMAL HIGH (ref 4.0–10.5)

## 2018-09-28 LAB — LACTIC ACID, PLASMA: LACTIC ACID, VENOUS: 1.7 mmol/L (ref 0.5–1.9)

## 2018-09-28 NOTE — ED Provider Notes (Signed)
I assumed care of the patient from Dr. Burlene Arnt at 11:30 PM with recommendation to follow-up on lactic acid and patient's ability to void on his own.  Patient's lactic acid normal at 1.7.  Patient had 2 large bowel movements since enema administration and was able to void on his own.  As per Dr. Andre Lefort recommendation patient will be discharged home with recommendation for outpatient follow-up   Gregor Hams, MD 09/28/18 [phone]

## 2018-09-28 NOTE — Patient Instructions (Addendum)
Use miralax daily  If abdominal pain - call or go back to ED If fever- let us know   Lab today  GI referral today   Here is info on plantar fasciitis

## 2018-09-28 NOTE — ED Notes (Signed)
Pt has at this time had BMx2 and has been able to void

## 2018-09-28 NOTE — Progress Notes (Signed)
Subjective:    Patient ID: Brian Todd, male    DOB: 02-Nov-1945, 73 y.o.   MRN: [id number]  HPI  Here for ED f/u and also left heel pain   Wt Readings from Last 3 Encounters:  09/28/18 205 lb 9 oz (93.2 kg)  01/08/18 203 lb 4 oz (92.2 kg)  12/19/17 195 lb 4 oz (88.6 kg)   31.72 kg/m   Temp: 99 F (37.2 C)  BP: 140/66  Pulse Rate: 87    Here for f/u of ED visit yesterday  He presented with urinary retention due to constipation  Did home enema without results  Did not have abd pain   Noted h/o colitis in the past  ? If ischemic or other 2016 Was tx with flagyl and cipro  Colonoscopy showed mild diverticulosis   Was in /out cathed - 400 cc urine resulted with relief   CT scan done Ct Abdomen Pelvis W Contrast  Result Date: 09/27/2018 CLINICAL DATA:  Lower abdominal pain. Constipation and urinary retention. EXAM: CT ABDOMEN AND PELVIS WITH CONTRAST TECHNIQUE: Multidetector CT imaging of the abdomen and pelvis was performed using the standard protocol following bolus administration of intravenous contrast. CONTRAST:  152mL OMNIPAQUE IOHEXOL 300 MG/ML  SOLN COMPARISON:  CT 04/04/2015 FINDINGS: Lower chest: The lung bases are clear. There are coronary artery calcifications. Hepatobiliary: No focal liver abnormality is seen. No gallstones, gallbladder wall thickening, or biliary dilatation. Pancreas: No ductal dilatation or inflammation. Spleen: Normal in size without focal abnormality. Adrenals/Urinary Tract: Normal adrenal glands. No hydronephrosis. Minimal symmetric perinephric edema. Homogeneous renal enhancement with symmetric excretion on delayed phase imaging. Tiny cortical hypodensities in the left kidney are too small to characterize. Urinary bladder is decompressed by Foley catheter and not well assessed. There is generalized pelvic fat stranding. Stomach/Bowel: Small hiatal hernia. Stomach physiologically distended. No small bowel wall thickening, inflammatory change, or  obstruction. There is fecalization of distal small bowel contents. Normal appendix. Overall moderate colonic stool burden. Moderate length segment of apparent sigmoid colon wall thickening spanning 5.8 cm with surrounding fat stranding. Stool distally distends the rectum with rectal distention of 6.4 cm. Mild presacral edema. Vascular/Lymphatic: Aorto bi-iliac atherosclerosis. No aneurysm. No enlarged lymph nodes in the abdomen or pelvis. Reproductive: Prostate is unremarkable. Other: Generalized fat stranding in the pelvis with small amount of free fluid. No free air. No upper abdominal ascites. No intra-abdominal abscess. Fat in the left inguinal canal. Musculoskeletal: There are no acute or suspicious osseous abnormalities. IMPRESSION: 1. Findings suspicious for sigmoid colitis with moderate length segment of wall thickening and surrounding inflammatory change. Findings may be infectious or inflammatory, however recommend up-to-date colonoscopy to exclude underlying neoplasm. 2. Foley catheter decompresses the urinary bladder which limits assessment. Stranding about the pelvis may be reactive secondary to colonic inflammation versus cystitis. 3. Moderate colonic stool burden with fecalization of distal small bowel contents, consistent with slow transit/constipation. Stool distends the rectum which may represent fecal impaction. 4.  Aortic Atherosclerosis (ICD10-I70.0). Electronically Signed   By: Keith Rake M.D.   On: 09/27/2018 22:58    Labs: Results for orders placed or performed during the hospital encounter of 09/27/18  CBC with Differential  Result Value Ref Range   WBC 20.9 (H) 4.0 - 10.5 K/uL   RBC 5.15 4.22 - 5.81 MIL/uL   Hemoglobin 15.6 13.0 - 17.0 g/dL   HCT 47.3 39.0 - 52.0 %   MCV 91.8 80.0 - 100.0 fL   MCH 30.3 26.0 -  34.0 pg   MCHC 33.0 30.0 - 36.0 g/dL   RDW 12.1 11.5 - 15.5 %   Platelets 206 150 - 400 K/uL   nRBC 0.0 0.0 - 0.2 %   Neutrophils Relative % 91 %   Neutro Abs  18.8 (H) 1.7 - 7.7 K/uL   Lymphocytes Relative 3 %   Lymphs Abs 0.6 (L) 0.7 - 4.0 K/uL   Monocytes Relative 6 %   Monocytes Absolute 1.3 (H) 0.1 - 1.0 K/uL   Eosinophils Relative 0 %   Eosinophils Absolute 0.0 0.0 - 0.5 K/uL   Basophils Relative 0 %   Basophils Absolute 0.0 0.0 - 0.1 K/uL   Immature Granulocytes 0 %   Abs Immature Granulocytes 0.08 (H) 0.00 - 0.07 K/uL  Urinalysis, Complete w Microscopic  Result Value Ref Range   Color, Urine YELLOW (A) YELLOW   APPearance CLEAR (A) CLEAR   Specific Gravity, Urine 1.019 1.005 - 1.030   pH 5.0 5.0 - 8.0   Glucose, UA NEGATIVE NEGATIVE mg/dL   Hgb urine dipstick MODERATE (A) NEGATIVE   Bilirubin Urine NEGATIVE NEGATIVE   Ketones, ur NEGATIVE NEGATIVE mg/dL   Protein, ur NEGATIVE NEGATIVE mg/dL   Nitrite NEGATIVE NEGATIVE   Leukocytes,Ua NEGATIVE NEGATIVE   RBC / HPF 11-20 0 - 5 RBC/hpf   WBC, UA 0-5 0 - 5 WBC/hpf   Bacteria, UA RARE (A) NONE SEEN   Squamous Epithelial / LPF NONE SEEN 0 - 5   Mucus PRESENT    Hyaline Casts, UA PRESENT   Comprehensive metabolic panel  Result Value Ref Range   Sodium 138 135 - 145 mmol/L   Potassium 4.1 3.5 - 5.1 mmol/L   Chloride 108 98 - 111 mmol/L   CO2 23 22 - 32 mmol/L   Glucose, Bld 152 (H) 70 - 99 mg/dL   BUN 23 8 - 23 mg/dL   Creatinine, Ser 0.99 0.61 - 1.24 mg/dL   Calcium 8.7 (L) 8.9 - 10.3 mg/dL   Total Protein 7.4 6.5 - 8.1 g/dL   Albumin 4.4 3.5 - 5.0 g/dL   AST 28 15 - 41 U/L   ALT 25 0 - 44 U/L   Alkaline Phosphatase 93 38 - 126 U/L   Total Bilirubin 0.9 0.3 - 1.2 mg/dL   GFR calc non Af Amer >60 >60 mL/min   GFR calc Af Amer >60 >60 mL/min   Anion gap 7 5 - 15  Lipase, blood  Result Value Ref Range   Lipase 29 11 - 51 U/L  Lactic acid, plasma  Result Value Ref Range   Lactic Acid, Venous 1.7 0.5 - 1.9 mmol/L    Enema was admin in ED 2 large bms Then able to void on his own   Discomfort now is from his procedure yesterday   Temp 99 Feels fine  No chills or  aches  Some discomfort rectal (From disimpaction) , and some urinary discomfort (from foley)   No n/v  (since nausea last night)   No abdominal pain but has a little discomfort / feels in low abdomen and rectum when he coughs or bares down  Much better than it was   Patient Active Problem List   Diagnosis Date Noted  . Intractable left heel pain 09/28/2018  . Colitis 09/28/2018  . Constipation 09/28/2018  . Sebaceous cyst 03/28/2017  . Prostate cancer screening 11/26/2016  . Routine general medical examination at a health care facility 11/17/2015  . Low back pain 07/10/2015  .  Coronary artery disease involving native coronary artery without angina pectoris 04/09/2015  . History of heart artery stent 01/01/2015  . History of colitis 11/08/2014  . Encounter for Medicare annual wellness exam 10/31/2014  . DEGENERATIVE JOINT DISEASE 07/30/2009  . HEARING LOSS, MILD 11/27/2007  . IRRITABLE BOWEL SYNDROME 11/27/2007  . GALLBLADDER DISEASE 11/27/2007  . Hyperlipidemia 08/13/2007  . Essential hypertension 08/13/2007  . GERD 08/13/2007   Past Medical History:  Diagnosis Date  . Colitis   . Coronary artery disease   . DJD (degenerative joint disease)   . Gallbladder disease   . GERD (gastroesophageal reflux disease)   . Hearing loss   . Hyperlipidemia   . Hypertension   . IBS (irritable bowel syndrome)   . Increased prostate specific antigen (PSA) velocity    Past Surgical History:  Procedure Laterality Date  . CARDIAC CATHETERIZATION    . CARDIAC CATHETERIZATION N/A 01/07/2015   Procedure: Left Heart Cath and Coronary Angiography;  Surgeon: [name], MD;  Location: [facility];  Service: Cardiovascular;  Laterality: N/A;  . CARDIAC CATHETERIZATION N/A 01/07/2015   Procedure: Coronary Stent Intervention;  Surgeon: [name], MD;  Location: [facility];  Service: Cardiovascular;  Laterality: N/A;  . COLONOSCOPY    . CORONARY STENT PLACEMENT     Dr.  [name]  . CORONARY STENT PLACEMENT  01/07/2015   LAD  . TONSILLECTOMY AND ADENOIDECTOMY     in the 2nd grade   Social History   Tobacco Use  . Smoking status: Never Smoker  . Smokeless tobacco: Never Used  Substance Use Topics  . Alcohol use: Yes    Alcohol/week: 0.0 standard drinks    Comment: rarely drinks beer/approx. 1 case per year  . Drug use: No   Family History  Problem Relation Age of Onset  . Stroke Mother    Allergies  Allergen Reactions  . Flagyl [Metronidazole]    Current Outpatient Medications on File Prior to Visit  Medication Sig Dispense Refill  . aspirin EC 81 MG tablet Take 1 tablet (81 mg total) by mouth daily.    Marland Kitchen atorvastatin (LIPITOR) 40 MG tablet Take 1 tablet (40 mg total) by mouth daily. 90 tablet 3  . clopidogrel (PLAVIX) 75 MG tablet TAKE 1 TABLET EVERY DAY 90 tablet 3  . metoprolol tartrate (LOPRESSOR) 25 MG tablet Take 1 tablet (25 mg total) by mouth 2 (two) times daily. 180 tablet 3  . Multiple Vitamins-Minerals (MULTIVITAMIN & MINERAL PO) Take 1 tablet by mouth daily.    . nitroGLYCERIN (NITROSTAT) 0.4 MG SL tablet Place 1 tablet (0.4 mg total) under the tongue every 5 (five) minutes as needed for chest pain. 25 tablet 3  . Omega-3 Krill Oil 500 MG CAPS Take 1 capsule by mouth daily.    . pantoprazole (PROTONIX) 40 MG tablet Take 1 tablet (40 mg total) by mouth daily. 90 tablet 3  . polyethylene glycol (MIRALAX) packet Take 17 g by mouth daily. 14 each 0  . ramipril (ALTACE) 10 MG capsule TAKE 1 CAPSULE BY MOUTH EVERY DAY 90 capsule 3   No current facility-administered medications on file prior to visit.      Review of Systems  Constitutional: Positive for fatigue. Negative for activity change, appetite change, fever and unexpected weight change.  HENT: Negative for congestion, rhinorrhea, sore throat and trouble swallowing.   Eyes: Negative for pain, redness, itching and visual disturbance.  Respiratory: Negative for cough, chest  tightness, shortness  of breath and wheezing.   Cardiovascular: Negative for chest pain and palpitations.  Gastrointestinal: Positive for constipation and rectal pain. Negative for abdominal distention, abdominal pain, anal bleeding, blood in stool, diarrhea, nausea and vomiting.       Abd pain resolved  Constipation resolved  Endocrine: Negative for cold intolerance, heat intolerance, polydipsia and polyuria.  Genitourinary: Positive for difficulty urinating. Negative for dysuria, frequency and urgency.       Urine retention is improved   Musculoskeletal: Negative for arthralgias, joint swelling and myalgias.  Skin: Negative for pallor and rash.  Neurological: Negative for dizziness, tremors, weakness, numbness and headaches.  Hematological: Negative for adenopathy. Does not bruise/bleed easily.  Psychiatric/Behavioral: Negative for decreased concentration and dysphoric mood. The patient is not nervous/anxious.        Objective:   Physical Exam Constitutional:      General: He is not in acute distress.    Appearance: Normal appearance. He is well-developed. He is obese. He is not ill-appearing or diaphoretic.  HENT:     Head: Normocephalic and atraumatic.     Mouth/Throat:     Mouth: Mucous membranes are moist.     Pharynx: Oropharynx is clear.  Eyes:     General: No scleral icterus.    Conjunctiva/sclera: Conjunctivae normal.     Pupils: Pupils are equal, round, and reactive to light.  Neck:     Musculoskeletal: Normal range of motion and neck supple.  Cardiovascular:     Rate and Rhythm: Normal rate and regular rhythm.     Heart sounds: Normal heart sounds.  Pulmonary:     Effort: Pulmonary effort is normal. No respiratory distress.     Breath sounds: Normal breath sounds. No wheezing or rales.  Abdominal:     General: Bowel sounds are normal. There is no distension.     Palpations: Abdomen is soft. There is no mass.     Tenderness: There is no abdominal tenderness. There  is no right CVA tenderness, left CVA tenderness, guarding or rebound.     Hernia: No hernia is present.     Comments: No tenderness No rebound/guarding No suprapubic fullness No cva tenderness  Musculoskeletal:     Right lower leg: No edema.     Left lower leg: No edema.  Lymphadenopathy:     Cervical: No cervical adenopathy.  Skin:    General: Skin is warm and dry.     Capillary Refill: Capillary refill takes less than 2 seconds.     Coloration: Skin is not jaundiced or pale.     Findings: No erythema or rash.  Neurological:     Mental Status: He is alert. Mental status is at baseline.     Coordination: Coordination normal.     Deep Tendon Reflexes: Reflexes normal.  Psychiatric:        Mood and Affect: Mood normal.     Comments: Pleasant and talkative            Assessment & Plan:   Problem List Items Addressed This Visit      Cardiovascular and Mediastinum   Aortic atherosclerosis ([place])    Noted inc on CT scan No symptoms  Working to improve cholesterol and BP        Digestive   IRRITABLE BOWEL SYNDROME    Disc plan to minimize constipation in future with diet/fluids and miralax      Colitis - Primary    H/o colitis (? Ischemic) Recent ED visit  Reviewed hospital records, lab results and studies in detail   Evidence of colitis on CT -but symptoms are much improved with resolution of constipation  Wbc was high-will re check this Reassuring exam  Disc parameters to call re: abd pain or fever  Ref to GI      Relevant Orders   Ambulatory referral to Gastroenterology   CBC with Differential/Platelet (Completed)     Other   History of colitis   Intractable left heel pain    Suspect plantar fasciitis Recommend supportive shoes/no barefoot/ice massage Handouts given incl rehab/stretching       Constipation    Severe enough to cause urinary retention (resolved after resolution of constipation ) Reviewed hospital records, lab results and studies in  detail   Much improved after disimpaction/enema  Some rectal soreness  Will f/u with GI regarding colitis on CT scan

## 2018-09-30 DIAGNOSIS — I700 Atherosclerosis of aorta: Secondary | ICD-10-CM | POA: Insufficient documentation

## 2018-09-30 NOTE — Assessment & Plan Note (Signed)
Disc plan to minimize constipation in future with diet/fluids and miralax

## 2018-09-30 NOTE — Assessment & Plan Note (Signed)
H/o colitis (? Ischemic) Recent ED visit   Reviewed hospital records, lab results and studies in detail   Evidence of colitis on CT -but symptoms are much improved with resolution of constipation  Wbc was high-will re check this Reassuring exam  Disc parameters to call re: abd pain or fever  Ref to GI

## 2018-09-30 NOTE — Assessment & Plan Note (Signed)
Severe enough to cause urinary retention (resolved after resolution of constipation ) Reviewed hospital records, lab results and studies in detail   Much improved after disimpaction/enema  Some rectal soreness  Will f/u with GI regarding colitis on CT scan

## 2018-09-30 NOTE — Assessment & Plan Note (Signed)
Noted inc on CT scan No symptoms  Working to improve cholesterol and BP

## 2018-09-30 NOTE — Assessment & Plan Note (Signed)
Suspect plantar fasciitis Recommend supportive shoes/no barefoot/ice massage Handouts given incl rehab/stretching

## 2018-10-01 ENCOUNTER — Telehealth: Payer: Self-pay

## 2018-10-01 MED ORDER — HYDROCORTISONE ACETATE 25 MG RE SUPP: 25.0000 mg | Freq: Two times a day (BID) | RECTAL | 0 refills | Status: DC

## 2018-10-01 NOTE — Telephone Encounter (Signed)
Pt notified Rx sent to pharmacy, pt did advised me he was having mild abd cramping and wanted to make sure Dr. Glori Bickers was aware. She was in her office so I let her know, she advised me to have pt "slow down" on the miralax and to keep an eye on sxs but if they worsen to go back to ER, pt will start doing miralax qod instead of daily, pt will call with an update tomorrow

## 2018-10-01 NOTE — Telephone Encounter (Signed)
It is more than likely from the trauma of the enema /etc  If he suddenly develops more bleeding please let me know  Otherwise f/u with GI as planned Thanks

## 2018-10-01 NOTE — Addendum Note (Signed)
Addended by: [name] A on: 10/01/2018 04:02 PM   Modules accepted: Orders

## 2018-10-01 NOTE — Telephone Encounter (Signed)
Pt last seen on 09/28/18; pt said about 10:55 AM this morning pt had first normal BM but when pt wiped saw small amt of pinkish red blood on toilet tissue and noticed small amt of streaks of pinkish red blood intermingled in stool. Pt isn't having any abdominal pain or fever. Pt wonders if saw blood due to procedures done at hospital on 09/27/18. Pt is also concerned if he should continue Plavix. Pt has appt to see GI on 10/04/18. Pt request cb.

## 2018-10-01 NOTE — Telephone Encounter (Signed)
Please send pended px to preferred pharmacy -anusol hc suppositories  This helps hemorrhoids as well as rectal irritation  Let me know if no improvement   If abdominal pain -alert [place] and/or get to ER if after hours Same if fever or if excessive rectal bleeding   Please update [place] tomorrow   Plan to see GI as planned

## 2018-10-01 NOTE — Telephone Encounter (Signed)
Pt notified of Dr. Marliss Coots instructions and verbalized understanding. Pt will update [place] if sxs worsen or if develops any new sxs, and he will keep his GI appt

## 2018-10-01 NOTE — Telephone Encounter (Signed)
Patient stated that his symptoms have gotten worse since he last talked with Dr. Alba Cory assistant and there is more blood. Patient stated that he is not having any abdominal pain. Patient stated that he had a BM is it was more like diarrhea and the water was pinkish/red  Patient has concerns about the blood that he is seeing now.

## 2018-10-01 NOTE — Addendum Note (Signed)
Addended by: Tammi Sou on: 10/01/2018 04:09 PM   Modules accepted: Orders

## 2018-10-03 NOTE — Telephone Encounter (Signed)
Pt calling and stated he has been taking the mediations and everything is improving and the bleeding has slowed down tremendously. Pt will keep his appt with Gastro tomorrow.

## 2018-10-03 NOTE — Telephone Encounter (Signed)
Great. Thanks for letting me know.

## 2018-10-04 ENCOUNTER — Other Ambulatory Visit: Payer: Self-pay

## 2018-10-04 ENCOUNTER — Telehealth: Payer: Self-pay

## 2018-10-04 ENCOUNTER — Encounter: Payer: Self-pay | Admitting: Physician Assistant

## 2018-10-04 ENCOUNTER — Ambulatory Visit: Payer: Medicare HMO | Admitting: Physician Assistant

## 2018-10-04 VITALS — BP 114/58 | HR 58 | Temp 98.9°F | Ht 67.5 in | Wt 202.0 lb

## 2018-10-04 DIAGNOSIS — K5900 Constipation, unspecified: Secondary | ICD-10-CM

## 2018-10-04 DIAGNOSIS — Z7901 Long term (current) use of anticoagulants: Secondary | ICD-10-CM

## 2018-10-04 DIAGNOSIS — R935 Abnormal findings on diagnostic imaging of other abdominal regions, including retroperitoneum: Secondary | ICD-10-CM | POA: Diagnosis not present

## 2018-10-04 DIAGNOSIS — K625 Hemorrhage of anus and rectum: Secondary | ICD-10-CM

## 2018-10-04 MED ORDER — SUPREP BOWEL PREP KIT 17.5-3.13-1.6 GM/177ML PO SOLN: 1.0000 | ORAL | 0 refills | Status: DC

## 2018-10-04 NOTE — Patient Instructions (Signed)
If you are age 71 or older, your body mass index should be between 23-30. Your Body mass index is 31.17 kg/m. If this is out of the aforementioned range listed, please consider follow up with your Primary Care Provider.  If you are age 69 or younger, your body mass index should be between 19-25. Your Body mass index is 31.17 kg/m. If this is out of the aformentioned range listed, please consider follow up with your Primary Care Provider.    You have been scheduled for a colonoscopy. Please follow written instructions given to you at your visit today.  Please pick up your prep supplies at the pharmacy within the next 1-3 days. If you use inhalers (even only as needed), please bring them with you on the day of your procedure. Your physician has requested that you go to www.startemmi.com and enter the access code given to you at your visit today. This web site gives a general overview about your procedure. However, you should still follow specific instructions given to you by our office regarding your preparation for the procedure.  We will send cardiac clearance to Dr. Kathlyn Sacramento for approval to hold your Plavix 5 days prior to procedure.   Thank you for choosing me and [facility].  Brian Todd

## 2018-10-04 NOTE — Progress Notes (Signed)
Chief Complaint: Abnormal CT of the abdomen, constipation  HPI:    Brian Todd is a 73 year old male with a past medical history as listed below including CAD status post second stent in 2016 on Plavix, who was referred to me by Abner Greenspan, MD for a complaint of abnormal CT of the abdomen and constipation.    12/09/2014 colonoscopy by Dr. Deatra Ina done for recent CT showing left-sided colitis.  At that time showed mild diverticulosis in the sigmoid colon was otherwise normal.  Was thought possibly colitis was secondary to ischemia.    09/27/2018 ER visit for urinary retention and constipation.  CT of the abdomen pelvis was suspicious for sigmoid colitis with moderate length segment of wall thickening and surrounding inflammatory change findings were thought possibly infectious or inflammatory however it was recommended patient have an up-to-date colonoscopy to exclude underlying neoplasm.  Foley catheter to decompress the urinary bladder.  Stranding about the pelvis was thought possibly reactive secondary to colonic inflammation versus cystitis.  Moderate colonic stool burden with fecal ideation of the distal small bowel contents consistent with slow transit/constipation.  Stool distended the rectum which was thought to possibly represent fecal impaction and aortic atherosclerosis.  Labs with elevated WBC.   Patient had disimpaction and Foley insertion with relief of 400 cc of urine.  Recommend he follow-up with GI.    Today, the patient tells me that he is doing much better.  Describes having daily bowel movements until about 3 days prior to presenting in the ER when he stopped passing stools.  The day he went to the ER he tried to pass a stool multiple times and had increasing discomfort and also tried to pass urine and could not, so he presented to the ER as above.  After disimpaction patient did see some bright red blood when he would wipe and in his stools which has stopped as of yesterday 10/03/2018.  He  is currently using MiraLAX on a daily basis in order to make sure he has regular bowel movements.  This is working for him.  No further discomfort.    Denies fever, chills, weight loss, anorexia, nausea, vomiting, heartburn, reflux or symptoms that awaken him from sleep.  Past Medical History:  Diagnosis Date  . Colitis   . Coronary artery disease   . DJD (degenerative joint disease)   . Gallbladder disease   . GERD (gastroesophageal reflux disease)   . Hearing loss   . Hyperlipidemia   . Hypertension   . IBS (irritable bowel syndrome)   . Increased prostate specific antigen (PSA) velocity     Past Surgical History:  Procedure Laterality Date  . CARDIAC CATHETERIZATION    . CARDIAC CATHETERIZATION N/A 01/07/2015   Procedure: Left Heart Cath and Coronary Angiography;  Surgeon: [name], MD;  Location: [facility];  Service: Cardiovascular;  Laterality: N/A;  . CARDIAC CATHETERIZATION N/A 01/07/2015   Procedure: Coronary Stent Intervention;  Surgeon: [name], MD;  Location: [facility];  Service: Cardiovascular;  Laterality: N/A;  . COLONOSCOPY    . CORONARY STENT PLACEMENT     Dr. [name]  . CORONARY STENT PLACEMENT  01/07/2015   LAD  . TONSILLECTOMY AND ADENOIDECTOMY     in the 2nd grade    Current Outpatient Medications  Medication Sig Dispense Refill  . aspirin EC 81 MG tablet Take 1 tablet (81 mg total) by mouth daily.    Marland Kitchen atorvastatin (LIPITOR) 40 MG tablet  Take 1 tablet (40 mg total) by mouth daily. 90 tablet 3  . clopidogrel (PLAVIX) 75 MG tablet TAKE 1 TABLET EVERY DAY 90 tablet 3  . hydrocortisone (ANUSOL-HC) 25 MG suppository Place 1 suppository (25 mg total) rectally 2 (two) times daily. 12 suppository 0  . metoprolol tartrate (LOPRESSOR) 25 MG tablet Take 1 tablet (25 mg total) by mouth 2 (two) times daily. 180 tablet 3  . Multiple Vitamins-Minerals (MULTIVITAMIN & MINERAL PO) Take 1 tablet by mouth daily.    . nitroGLYCERIN (NITROSTAT)  0.4 MG SL tablet Place 1 tablet (0.4 mg total) under the tongue every 5 (five) minutes as needed for chest pain. 25 tablet 3  . Omega-3 Krill Oil 500 MG CAPS Take 1 capsule by mouth daily.    . pantoprazole (PROTONIX) 40 MG tablet Take 1 tablet (40 mg total) by mouth daily. 90 tablet 3  . polyethylene glycol (MIRALAX) packet Take 17 g by mouth daily. 14 each 0  . ramipril (ALTACE) 10 MG capsule TAKE 1 CAPSULE BY MOUTH EVERY DAY 90 capsule 3   No current facility-administered medications for this visit.     Allergies as of 10/04/2018 - Review Complete 10/04/2018  Allergen Reaction Noted  . Flagyl [metronidazole]  05/04/2015    Family History  Problem Relation Age of Onset  . Stroke Mother     Social History   Socioeconomic History  . Marital status: Married    Spouse name: linda wrenn Dhaliwal  . Number of children: 2  . Years of education: Not on file  . Highest education level: Not on file  Occupational History  . Occupation: Engineer, production    Comment: Retired  Scientific laboratory technician  . Financial resource strain: Not on file  . Food insecurity:    Worry: Not on file    Inability: Not on file  . Transportation needs:    Medical: Not on file    Non-medical: Not on file  Tobacco Use  . Smoking status: Never Smoker  . Smokeless tobacco: Never Used  Substance and Sexual Activity  . Alcohol use: Yes    Alcohol/week: 0.0 standard drinks    Comment: rarely drinks beer/approx. 1 case per year  . Drug use: No  . Sexual activity: Yes  Lifestyle  . Physical activity:    Days per week: Not on file    Minutes per session: Not on file  . Stress: Not on file  Relationships  . Social connections:    Talks on phone: Not on file    Gets together: Not on file    Attends religious service: Not on file    Active member of club or organization: Not on file    Attends meetings of clubs or organizations: Not on file    Relationship status: Not on file  . Intimate partner violence:    Fear of  current or ex partner: Not on file    Emotionally abused: Not on file    Physically abused: Not on file    Forced sexual activity: Not on file  Other Topics Concern  . Not on file  Social History Narrative  . Not on file    Review of Systems:    Constitutional: No weight loss, fever or chills Skin: No rash  Cardiovascular: No chest pain Respiratory: No SOB  Gastrointestinal: See HPI and otherwise negative Genitourinary: No dysuria  Neurological: No headache, dizziness or syncope Musculoskeletal: No new muscle or joint pain Hematologic: No bleeding  Psychiatric: No history  of depression or anxiety   Physical Exam:  Vital signs: BP (!) 114/58 (BP Location: Left Arm, Patient Position: Sitting, Cuff Size: Normal)   Pulse (!) 58   Temp 98.9 F (37.2 C)   Ht 5' 7.5" (1.715 m) Comment: height measured without shoes  Wt 202 lb (91.6 kg)   BMI 31.17 kg/m   Constitutional:   Pleasant Caucasian male appears to be in NAD, Well developed, Well nourished, alert and cooperative Head:  Normocephalic and atraumatic. Eyes:   PEERL, EOMI. No icterus. Conjunctiva pink. Ears:  Normal auditory acuity. Neck:  Supple Throat: Oral cavity and pharynx without inflammation, swelling or lesion.  Respiratory: Respirations even and unlabored. Lungs clear to auscultation bilaterally.   No wheezes, crackles, or rhonchi.  Cardiovascular: Normal S1, S2. No MRG. Regular rate and rhythm. No peripheral edema, cyanosis or pallor.  Gastrointestinal:  Soft, nondistended, nontender. No rebound or guarding. Normal bowel sounds. No appreciable masses or hepatomegaly. Rectal:  Not performed.  Msk:  Symmetrical without gross deformities. Without edema, no deformity or joint abnormality.  Neurologic:  Alert and  oriented x4;  grossly normal neurologically.  Skin:   Dry and intact without significant lesions or rashes. Psychiatric: Demonstrates good judgement and reason without abnormal affect or behaviors.  MOST  RECENT LABS AND IMAGING: CBC    Component Value Date/Time   WBC 15.5 (H) 09/28/2018 1204   RBC 4.84 09/28/2018 1204   HGB 15.0 09/28/2018 1204   HGB 13.9 01/02/2015 1558   HCT 44.6 09/28/2018 1204   HCT 42.4 01/02/2015 1558   PLT 223.0 09/28/2018 1204   PLT 207 01/02/2015 1558   MCV 92.2 09/28/2018 1204   MCV 91 01/02/2015 1558   MCH 30.3 09/27/2018 2156   MCHC 33.7 09/28/2018 1204   RDW 13.3 09/28/2018 1204   RDW 13.2 01/02/2015 1558   LYMPHSABS 2.1 09/28/2018 1204   LYMPHSABS 2.1 01/02/2015 1558   MONOABS 1.8 (H) 09/28/2018 1204   EOSABS 0.0 09/28/2018 1204   EOSABS 0.1 01/02/2015 1558   BASOSABS 0.0 09/28/2018 1204   BASOSABS 0.0 01/02/2015 1558    CMP     Component Value Date/Time   NA 138 09/27/2018 2156   NA 141 01/02/2015 1558   K 4.1 09/27/2018 2156   CL 108 09/27/2018 2156   CO2 23 09/27/2018 2156   GLUCOSE 152 (H) 09/27/2018 2156   BUN 23 09/27/2018 2156   BUN 15 01/02/2015 1558   CREATININE 0.99 09/27/2018 2156   CALCIUM 8.7 (L) 09/27/2018 2156   PROT 7.4 09/27/2018 2156   ALBUMIN 4.4 09/27/2018 2156   AST 28 09/27/2018 2156   ALT 25 09/27/2018 2156   ALKPHOS 93 09/27/2018 2156   BILITOT 0.9 09/27/2018 2156   GFRNONAA >60 09/27/2018 2156   GFRAA >60 09/27/2018 2156    Assessment: 1.  Abnormal CT of the abdomen: Showing sigmoid colitis, likely related to fecal impaction at the time, but further evaluation recommended by radiology and most recent colonoscopy in 2016 2.  Constipation with fecal impaction: Somewhat acute, occurred over 3 days time, now better on MiraLAX 3.  Chronic anticoagulation: With Plavix for CAD status post stenting last in 2016 4. Rectal bleeding: after disimpaction, likely from trauma vs stercoral ulcer  Plan: 1.  Scheduled patient for a colonoscopy with Dr. Fuller Plan, per patient's request as his wife sees Dr. Fuller Plan, did discuss risks, benefits, limitations and alternatives and the patient agrees to proceed. 2.  Continue MiraLAX  daily 3.  Patient will  need to hold his Plavix for 5 days prior to time of colonoscopy.  We will communicate with his prescribing physician Dr. Earlie Server to ensure that holding his Plavix is acceptable for him. 4.  Patient to follow in clinic per recommendations from Dr. Fuller Plan after time of procedure.  Ellouise Newer, PA-C [facility] 10/04/2018, 2:00 PM  Cc: Tower, Wynelle Fanny, MD

## 2018-10-04 NOTE — Telephone Encounter (Signed)
 [facility] Pre-operative Risk Assessment     Request for surgical clearance:     Endoscopy Procedure  What type of surgery is being performed?     Colonoscopy   When is this surgery scheduled?     10/17/2018  What type of clearance is required ?   Pharmacy  Are there any medications that need to be held prior to surgery and how long? X 5 days prior to procedure   Practice name and name of physician performing surgery?      [facility]  What is your office phone and fax number?      Phone- [phone]  Fax- [phone] Attn: Helmut Muster   Anesthesia type (None, local, MAC, general) ?       MAC

## 2018-10-04 NOTE — Telephone Encounter (Signed)
Left v/m on patients ans mach advising that he needs an office appointment with cardiology before he can be given clearance.

## 2018-10-04 NOTE — Telephone Encounter (Signed)
   Primary Cardiologist:Muhammad Fletcher Anon, MD  Chart reviewed as part of pre-operative protocol coverage. Because of Brian Todd's past medical history and time since last visit, he/she will require a follow-up visit in order to better assess preoperative cardiovascular risk.  Pre-op covering staff: - Please schedule appointment and call patient to inform them. - Please contact requesting surgeon's office via preferred method (i.e, phone, fax) to inform them of need for appointment prior to surgery.  If applicable, this message will also be routed to pharmacy pool and/or primary cardiologist for input on holding anticoagulant/antiplatelet agent as requested below so that this information is available at time of patient's appointment.   Cecilie Kicks, NP  10/04/2018, 3:17 PM

## 2018-10-04 NOTE — Telephone Encounter (Signed)
Thank you Bridget

## 2018-10-04 NOTE — Telephone Encounter (Signed)
Pt called back and wanted to let you know that he has an appt with the cardiologist on 10/09/2018. You can return his call if needed.

## 2018-10-07 NOTE — Progress Notes (Signed)
Reviewed and agree with management plan.  Loray Akard T. Juandiego Kolenovic, MD FACG 

## 2018-10-09 ENCOUNTER — Encounter: Payer: Self-pay | Admitting: Gastroenterology

## 2018-10-09 ENCOUNTER — Telehealth: Payer: Self-pay

## 2018-10-09 ENCOUNTER — Encounter: Payer: Self-pay | Admitting: Nurse Practitioner

## 2018-10-09 ENCOUNTER — Ambulatory Visit: Payer: Medicare HMO | Admitting: Nurse Practitioner

## 2018-10-09 NOTE — Telephone Encounter (Signed)
Call to patient to reschedule in regards to COVID-19 restrictions.    Pt reports that he was being seen as pre surgical clearance.  He is aware to stop Plavix on 3/20 prior to his colonoscopy.   He is comfortable with rescheduling at this time.   He does want a call back in regards to any further instructions prior to colonoscopy.   Message routed to cv div [id number] pool and Dr. Fletcher Anon to further advise.

## 2018-10-09 NOTE — Progress Notes (Deleted)
Office Visit    Patient Name: Brian Todd Date of Encounter: 10/09/2018  Primary Care Provider:  Abner Greenspan, MD Primary Cardiologist:  Kathlyn Sacramento, MD  Chief Complaint    ***  Past Medical History    Past Medical History:  Diagnosis Date  . Colitis   . Coronary artery disease   . DJD (degenerative joint disease)   . Gallbladder disease   . GERD (gastroesophageal reflux disease)   . Hearing loss   . Hyperlipidemia   . Hypertension   . IBS (irritable bowel syndrome)   . Increased prostate specific antigen (PSA) velocity    Past Surgical History:  Procedure Laterality Date  . CARDIAC CATHETERIZATION    . CARDIAC CATHETERIZATION N/A 01/07/2015   Procedure: Left Heart Cath and Coronary Angiography;  Surgeon: [name], MD;  Location: [facility];  Service: Cardiovascular;  Laterality: N/A;  . CARDIAC CATHETERIZATION N/A 01/07/2015   Procedure: Coronary Stent Intervention;  Surgeon: [name], MD;  Location: [facility];  Service: Cardiovascular;  Laterality: N/A;  . COLONOSCOPY    . CORONARY STENT PLACEMENT     Dr. [name]  . CORONARY STENT PLACEMENT  01/07/2015   LAD  . TONSILLECTOMY AND ADENOIDECTOMY     in the 2nd grade    Allergies  Allergies  Allergen Reactions  . Flagyl [Metronidazole]     History of Present Illness    ***  Home Medications    Prior to Admission medications   Medication Sig Start Date End Date Taking? Authorizing Provider  aspirin EC 81 MG tablet Take 1 tablet (81 mg total) by mouth daily. 01/01/15   Lucille Passy, MD  atorvastatin (LIPITOR) 40 MG tablet Take 1 tablet (40 mg total) by mouth daily. 12/19/17   Tower, Wynelle Fanny, MD  clopidogrel (PLAVIX) 75 MG tablet TAKE 1 TABLET EVERY DAY 03/20/18   [name], MD  hydrocortisone (ANUSOL-HC) 25 MG suppository Place 1 suppository (25 mg total) rectally 2 (two) times daily. 10/01/18   Tower, Wynelle Fanny, MD  metoprolol tartrate (LOPRESSOR) 25 MG tablet  Take 1 tablet (25 mg total) by mouth 2 (two) times daily. 11/30/17   [name], MD  Multiple Vitamins-Minerals (MULTIVITAMIN & MINERAL PO) Take 1 tablet by mouth daily.    [provider]  nitroGLYCERIN (NITROSTAT) 0.4 MG SL tablet Place 1 tablet (0.4 mg total) under the tongue every 5 (five) minutes as needed for chest pain. 12/01/16   [name], MD  Omega-3 Krill Oil 500 MG CAPS Take 1 capsule by mouth daily.    [provider]  pantoprazole (PROTONIX) 40 MG tablet Take 1 tablet (40 mg total) by mouth daily. 12/19/17   Tower, Wynelle Fanny, MD  polyethylene glycol [facility]) packet Take 17 g by mouth daily. 09/27/18   Schuyler Amor, MD  ramipril (ALTACE) 10 MG capsule TAKE 1 CAPSULE BY MOUTH EVERY DAY 12/19/17   Tower, Wynelle Fanny, MD  SUPREP BOWEL PREP KIT 17.5-3.13-1.6 GM/177ML SOLN Take 1 kit by mouth as directed. For colonoscopy prep 10/04/18   Levin Erp, PA    Review of Systems    ***.  All other systems reviewed and are otherwise negative except as noted above.  Physical Exam    VS:  There were no vitals taken for this visit. , BMI There is no height or weight on file to calculate BMI. GEN: Well nourished, well developed, in no acute  distress. HEENT: normal. Neck: Supple, no JVD, carotid bruits, or masses. Cardiac: RRR, no murmurs, rubs, or gallops. No clubbing, cyanosis, edema.  Radials/DP/PT 2+ and equal bilaterally.  Respiratory:  Respirations regular and unlabored, clear to auscultation bilaterally. GI: Soft, nontender, nondistended, BS + x 4. MS: no deformity or atrophy. Skin: warm and dry, no rash. Neuro:  Strength and sensation are intact. Psych: Normal affect.  Accessory Clinical Findings    ECG personally reviewed by me today - *** - no acute changes.  Assessment & Plan    1.  ***   Murray Hodgkins, NP 10/09/2018, 8:21 AM

## 2018-10-10 NOTE — Telephone Encounter (Signed)
Dr.Stark patient has been given clearance to hold Plavix 5 days prior to his procedure on 10/17/2018. Please see chart notes.

## 2018-10-10 NOTE — Telephone Encounter (Signed)
Brian Todd in case you were looking for this.

## 2018-10-10 NOTE — Telephone Encounter (Signed)
Brian Todd this patient has been cleared. I sent message to Dr. Fuller Plan and pt has been informed.

## 2018-10-10 NOTE — Telephone Encounter (Signed)
Can you make sure patient is aware and everything is good to go with his appt, I am not sure which CMA I was working with this day. Thanks-JLL

## 2018-10-10 NOTE — Telephone Encounter (Signed)
OK. Forwarding to JL as she saw this patient.

## 2018-10-17 ENCOUNTER — Encounter: Payer: Medicare HMO | Admitting: Gastroenterology

## 2018-10-22 NOTE — Telephone Encounter (Signed)
Patient declined Evisit   Currently scheduled for 5-21 ov for clearance   Nothing further needed at this time.

## 2018-11-19 ENCOUNTER — Telehealth: Payer: Self-pay | Admitting: *Deleted

## 2018-11-19 NOTE — Telephone Encounter (Signed)
See 3/12 office note and CT report. He had unexplained rectal bleeding and an abnormal CT of the sigmoid colon. Neoplasm needs to be excluded. Cardiology visit, which could be telemedicine visit, for clearance is reasonable. I am recommending that he proceeds with colonoscopy as soon as possible after cardiac clearance. As always it is the patients choice to accept or decline our recommendations.

## 2018-11-19 NOTE — Telephone Encounter (Signed)
Dr. Fuller Plan,  I called this patient to reschedule for colonoscopy per your review. He states he is not having any problems and is wondering if he really needs to have this done, he states he prefers not to as he states it would be a financial hardship right now. He also states he was told he would need to see his cardiologist before procedure was completed and he is concerned that he received clearance without having seen him. I explained that you had reviewed his records and was recommending f/u with colonoscopy and how cardiac clearance was received. He is asking again, if he really needs to complete this as he feels fine and is not having any issues.  Please advise.  Thank you.

## 2018-11-19 NOTE — Telephone Encounter (Signed)
Attempted to call patient to review Dr. Lynne Leader advice, no answer, left message for patient to call back.

## 2018-11-19 NOTE — Telephone Encounter (Signed)
Pt called back and was notified of Dr. Lynne Leader recommendations. He scheduled appointment for 11/29/18 at 24 am. Suprep sample requested and is awaiting patient with new instructions on the 3rd floor. He will come to pick it up tomorrow morning. He again inquired of cardiology appt, I gave him the information from notes and he states if he has clearance from a blood thinner standpoint, he is unsure of what further needs to be done at this time. He knows he will take his last dose of Plavix 5 days prior to procedure which will be 11/24/18.

## 2018-11-27 ENCOUNTER — Telehealth: Payer: Self-pay | Admitting: *Deleted

## 2018-11-27 NOTE — Telephone Encounter (Signed)
Covid-19 travel screening questions  Have you traveled in the last 14 days?no If yes where?  Do you now or have you had a fever in the last 14 days?no  Do you have any respiratory symptoms of shortness of breath or cough now or in the last 14 days?no  Do you have a medical history of Congestive Heart Failure?no  Do you have a medical history of lung disease?no  Do you have any family members or close contacts with diagnosed or suspected Covid-19? no   Spoke with patient and he is aware of the care partner policy and was told to bring his own PPE if he has it. SM

## 2018-11-29 ENCOUNTER — Other Ambulatory Visit: Payer: Self-pay

## 2018-11-29 ENCOUNTER — Encounter: Payer: Self-pay | Admitting: Gastroenterology

## 2018-11-29 ENCOUNTER — Ambulatory Visit (AMBULATORY_SURGERY_CENTER): Payer: Medicare HMO | Admitting: Gastroenterology

## 2018-11-29 VITALS — BP 109/53 | HR 47 | Temp 98.3°F | Resp 12 | Ht 67.5 in | Wt 202.0 lb

## 2018-11-29 DIAGNOSIS — K5730 Diverticulosis of large intestine without perforation or abscess without bleeding: Secondary | ICD-10-CM

## 2018-11-29 DIAGNOSIS — K921 Melena: Secondary | ICD-10-CM | POA: Diagnosis not present

## 2018-11-29 DIAGNOSIS — K644 Residual hemorrhoidal skin tags: Secondary | ICD-10-CM

## 2018-11-29 DIAGNOSIS — R933 Abnormal findings on diagnostic imaging of other parts of digestive tract: Secondary | ICD-10-CM | POA: Diagnosis not present

## 2018-11-29 MED ORDER — SODIUM CHLORIDE 0.9 % IV SOLN
500.0000 mL | Freq: Once | INTRAVENOUS | Status: DC
Start: 2018-11-29 — End: 2019-01-04

## 2018-11-29 NOTE — Op Note (Addendum)
[facility] Patient Name: Brian Todd Procedure Date: 11/29/2018 10:05 AM MRN: [id number] Endoscopist: Ladene Artist , MD Age: 73 Referring MD:  Date of Birth: [birth year] Gender: Male Account #: [id number] Procedure:                Colonoscopy Indications:              Hematochezia, Abnormal CT of the colon Medicines:                Monitored Anesthesia Care Procedure:                Pre-Anesthesia Assessment:                           - Prior to the procedure, a History and Physical                            was performed, and patient medications and                            allergies were reviewed. The patient's tolerance of                            previous anesthesia was also reviewed. The risks                            and benefits of the procedure and the sedation                            options and risks were discussed with the patient.                            All questions were answered, and informed consent                            was obtained. Prior Anticoagulants: The patient has                            taken Plavix (clopidogrel), last dose was 5 days                            prior to procedure. ASA Grade Assessment: III - A                            patient with severe systemic disease. After                            reviewing the risks and benefits, the patient was                            deemed in satisfactory condition to undergo the                            procedure.  After obtaining informed consent, the colonoscope                            was passed under direct vision. Throughout the                            procedure, the patient's blood pressure, pulse, and                            oxygen saturations were monitored continuously. The                            Colonoscope was introduced through the anus and                            advanced to the the cecum, identified by             appendiceal orifice and ileocecal valve. The                            ileocecal valve, appendiceal orifice, and rectum                            were photographed. The quality of the bowel                            preparation was good. The colonoscopy was performed                            without difficulty. The patient tolerated the                            procedure well. Scope In: 10:17:03 AM Scope Out: 10:27:56 AM Scope Withdrawal Time: 0 hours 8 minutes 52 seconds  Total Procedure Duration: 0 hours 10 minutes 53 seconds  Findings:                 Skin tags were found on perianal exam.                           A few small-mouthed diverticula were found in the                            sigmoid colon.                           Internal hemorrhoids were found during                            retroflexion. The hemorrhoids were medium-sized and                            Grade I (internal hemorrhoids that do not prolapse).                           The exam was otherwise  without abnormality on                            direct and retroflexion views. Complications:            No immediate complications. Estimated blood loss:                            None. Estimated Blood Loss:     Estimated blood loss: none. Impression:               - Perianal skin tags found on perianal exam.                           - Diverticulosis in the sigmoid colon.                           - Internal hemorrhoids.                           - The examination was otherwise normal on direct                            and retroflexion views.                           - No specimens collected. Recommendation:           - Patient has a contact number available for                            emergencies. The signs and symptoms of potential                            delayed complications were discussed with the                            patient. Return to normal activities tomorrow.                             Written discharge instructions were provided to the                            patient.                           - High fiber diet.                           - Continue present medications.                           - No repeat colonoscopy due to age.                           - Resume Plavix (clopidogrel) at prior dose today.  Refer to managing physician for further adjustment                            of therapy. Ladene Artist, MD 11/29/2018 10:32:05 AM This report has been signed electronically.

## 2018-11-29 NOTE — Patient Instructions (Signed)
YOU HAD AN ENDOSCOPIC PROCEDURE TODAY AT [facility]:   Refer to the procedure report that was given to you for any specific questions about what was found during the examination.  If the procedure report does not answer your questions, please call your gastroenterologist to clarify.  If you requested that your care partner not be given the details of your procedure findings, then the procedure report has been included in a sealed envelope for you to review at your convenience later.  YOU SHOULD EXPECT: Some feelings of bloating in the abdomen. Passage of more gas than usual.  Walking can help get rid of the air that was put into your GI tract during the procedure and reduce the bloating. If you had a lower endoscopy (such as a colonoscopy or flexible sigmoidoscopy) you may notice spotting of blood in your stool or on the toilet paper. If you underwent a bowel prep for your procedure, you may not have a normal bowel movement for a few days.  Please Note:  You might notice some irritation and congestion in your nose or some drainage.  This is from the oxygen used during your procedure.  There is no need for concern and it should clear up in a day or so.  SYMPTOMS TO REPORT IMMEDIATELY:   Following lower endoscopy (colonoscopy or flexible sigmoidoscopy):  Excessive amounts of blood in the stool  Significant tenderness or worsening of abdominal pains  Swelling of the abdomen that is new, acute  Fever of 100F or higher  For urgent or emergent issues, a gastroenterologist can be reached at any hour by calling [phone].   DIET:  We do recommend a small meal at first, but then you may proceed to your regular diet.  Drink plenty of fluids but you should avoid alcoholic beverages for 24 hours.  ACTIVITY:  You should plan to take it easy for the rest of today and you should NOT DRIVE or use heavy machinery until tomorrow (because of the sedation medicines used during the test).     FOLLOW UP: Our staff will call the number listed on your records the next business day following your procedure to check on you and address any questions or concerns that you may have regarding the information given to you following your procedure. If we do not reach you, we will leave a message.  However, if you are feeling well and you are not experiencing any problems, there is no need to return our call.  We will assume that you have returned to your regular daily activities without incident. We will be calling you two weeks following your procedure to see if you have developed any symptoms of the COVID-19.  If you develop any symptoms before then, please let us know.  If any biopsies were taken you will be contacted by phone or by letter within the next 1-3 weeks.  Please call us at [phone] if you have not heard about the biopsies in 3 weeks.  Diverticulosis (handout given) Hemorrhoids (handout given) High Fiber Diet (handout given) Resume Plavix at prior dose today  Refer to managing physician for further adjustment of therapy No repeat Colonoscopy due to age  SIGNATURES/CONFIDENTIALITY: You and/or your care partner have signed paperwork which will be entered into your electronic medical record.  These signatures attest to the fact that that the information above on your After Visit Summary has been reviewed and is understood.  Full responsibility of the confidentiality of this  discharge information lies with you and/or your care-partner. 

## 2018-11-29 NOTE — Progress Notes (Signed)
TO PACU, VSS. Report to [email or web address]

## 2018-12-03 ENCOUNTER — Telehealth: Payer: Self-pay

## 2018-12-03 NOTE — Telephone Encounter (Signed)
  Follow up Call-  Call back number 11/29/2018  Post procedure Call Back phone  # [phone]  Permission to leave phone message Yes  Some recent data might be hidden     Patient questions:  Do you have a fever, pain , or abdominal swelling? No. Pain Score  0 *  Have you tolerated food without any problems? Yes.    Have you been able to return to your normal activities? Yes.    Do you have any questions about your discharge instructions: Diet   No. Medications  No. Follow up visit  No.  Do you have questions or concerns about your Care? No.  Actions: * If pain score is 4 or above: No action needed, pain <4.

## 2018-12-06 ENCOUNTER — Telehealth: Payer: Self-pay | Admitting: *Deleted

## 2018-12-06 NOTE — Telephone Encounter (Signed)
1. Have you developed a fever since your procedure? no  2.   Have you had an respiratory symptoms (SOB or cough) since your procedure? no  3.   Have you tested positive for COVID 19 since your procedure no  3.   Have you had any family members/close contacts diagnosed with the COVID 19 since your procedure?  no   If any of these questions are a yes, please inquire if patient has been seen by family doctor and route this note to Tracy Walton, RN.  

## 2018-12-13 ENCOUNTER — Ambulatory Visit: Payer: Medicare HMO | Admitting: Cardiovascular Disease

## 2018-12-25 DIAGNOSIS — H0015 Chalazion left lower eyelid: Secondary | ICD-10-CM | POA: Diagnosis not present

## 2018-12-25 DIAGNOSIS — H00035 Abscess of left lower eyelid: Secondary | ICD-10-CM | POA: Diagnosis not present

## 2018-12-30 ENCOUNTER — Other Ambulatory Visit: Payer: Self-pay | Admitting: Cardiovascular Disease

## 2018-12-30 ENCOUNTER — Other Ambulatory Visit: Payer: Self-pay | Admitting: Family Medicine

## 2018-12-31 ENCOUNTER — Telehealth: Payer: Self-pay | Admitting: Family Medicine

## 2018-12-31 ENCOUNTER — Other Ambulatory Visit: Payer: Self-pay | Admitting: Family Medicine

## 2018-12-31 DIAGNOSIS — E7800 Pure hypercholesterolemia, unspecified: Secondary | ICD-10-CM

## 2018-12-31 DIAGNOSIS — I10 Essential (primary) hypertension: Secondary | ICD-10-CM

## 2018-12-31 DIAGNOSIS — Z125 Encounter for screening for malignant neoplasm of prostate: Secondary | ICD-10-CM

## 2018-12-31 NOTE — Telephone Encounter (Signed)
-----   Message from Cloyd Stagers, RT sent at 12/26/2018  1:45 PM EDT ----- Regarding: Lab Orders for Tuesday 6.9.2020 Please place lab orders for Tuesday 6.9.2020, [facility] physical on Friday 6.12.2020 Thank you, Dyke Maes RT(R)

## 2019-01-01 ENCOUNTER — Other Ambulatory Visit (INDEPENDENT_AMBULATORY_CARE_PROVIDER_SITE_OTHER): Payer: Medicare HMO

## 2019-01-01 ENCOUNTER — Ambulatory Visit (INDEPENDENT_AMBULATORY_CARE_PROVIDER_SITE_OTHER): Payer: Medicare HMO

## 2019-01-01 VITALS — Wt 183.9 lb

## 2019-01-01 DIAGNOSIS — Z125 Encounter for screening for malignant neoplasm of prostate: Secondary | ICD-10-CM | POA: Diagnosis not present

## 2019-01-01 DIAGNOSIS — E7800 Pure hypercholesterolemia, unspecified: Secondary | ICD-10-CM | POA: Diagnosis not present

## 2019-01-01 DIAGNOSIS — I10 Essential (primary) hypertension: Secondary | ICD-10-CM

## 2019-01-01 DIAGNOSIS — Z0000 Encounter for general adult medical examination without abnormal findings: Secondary | ICD-10-CM | POA: Diagnosis not present

## 2019-01-01 LAB — CBC WITH DIFFERENTIAL/PLATELET
Basophils Absolute: 0 10*3/uL (ref 0.0–0.1)
Basophils Relative: 0.5 % (ref 0.0–3.0)
Eosinophils Absolute: 0.1 10*3/uL (ref 0.0–0.7)
Eosinophils Relative: 1.6 % (ref 0.0–5.0)
HCT: 45.6 % (ref 39.0–52.0)
Hemoglobin: 15.1 g/dL (ref 13.0–17.0)
Lymphocytes Relative: 33.4 % (ref 12.0–46.0)
Lymphs Abs: 1.9 10*3/uL (ref 0.7–4.0)
MCHC: 33 g/dL (ref 30.0–36.0)
MCV: 93.6 fl (ref 78.0–100.0)
Monocytes Absolute: 0.5 10*3/uL (ref 0.1–1.0)
Monocytes Relative: 9.1 % (ref 3.0–12.0)
Neutro Abs: 3.1 10*3/uL (ref 1.4–7.7)
Neutrophils Relative %: 55.4 % (ref 43.0–77.0)
Platelets: 184 10*3/uL (ref 150.0–400.0)
RBC: 4.87 Mil/uL (ref 4.22–5.81)
RDW: 13.3 % (ref 11.5–15.5)
WBC: 5.6 10*3/uL (ref 4.0–10.5)

## 2019-01-01 LAB — PSA, MEDICARE: PSA: 2.37 ng/ml (ref 0.10–4.00)

## 2019-01-01 LAB — COMPREHENSIVE METABOLIC PANEL
ALT: 19 U/L (ref 0–53)
AST: 21 U/L (ref 0–37)
Albumin: 4.1 g/dL (ref 3.5–5.2)
Alkaline Phosphatase: 104 U/L (ref 39–117)
BUN: 22 mg/dL (ref 6–23)
CO2: 27 mEq/L (ref 19–32)
Calcium: 9.3 mg/dL (ref 8.4–10.5)
Chloride: 105 mEq/L (ref 96–112)
Creatinine, Ser: 1.03 mg/dL (ref 0.40–1.50)
GFR: 71.15 mL/min (ref >60.00)
Glucose, Bld: 84 mg/dL (ref 70–99)
Potassium: 4.5 mEq/L (ref 3.5–5.1)
Sodium: 139 mEq/L (ref 135–145)
Total Bilirubin: 0.8 mg/dL (ref 0.2–1.2)
Total Protein: 6.6 g/dL (ref 6.0–8.3)

## 2019-01-01 LAB — LIPID PANEL
Cholesterol: 120 mg/dL (ref 0–200)
HDL: 43.1 mg/dL (ref >39.00)
LDL Cholesterol: 58 mg/dL (ref 0–99)
NonHDL: 77.07
Total CHOL/HDL Ratio: 3
Triglycerides: 93 mg/dL (ref 0.0–149.0)
VLDL: 18.6 mg/dL (ref 0.0–40.0)

## 2019-01-01 LAB — TSH: TSH: 2.55 u[IU]/mL (ref 0.35–4.50)

## 2019-01-01 NOTE — Progress Notes (Signed)
PCP notes:   Health maintenance:  No gaps identified  Abnormal screenings:   None  Patient concerns:   Intermittent headaches - possible side effect from eye medication per patient report  Patient reported colonoscopy without abnormal findings in May 2020.  Nurse concerns:  None  Next PCP appt:   01/04/19 @ 1015  I reviewed health advisor's note, was available for consultation, and agree with documentation and plan. [name] MD

## 2019-01-01 NOTE — Progress Notes (Signed)
Subjective:   Brian Todd is a 73 y.o. male who presents for Medicare Annual/Subsequent preventive examination.  Review of Systems:  N/A Cardiac Risk Factors include: advanced age (>16men, >41 women);hypertension;dyslipidemia;male gender     Objective:    Vitals: Wt 183 lb 14.4 oz (83.4 kg) Comment: patient supplied  BMI 28.38 kg/m   Body mass index is 28.38 kg/m.  Advanced Directives 01/01/2019 09/27/2018 12/12/2017 12/01/2016 12/01/2015 04/04/2015 01/11/2015  Does Patient Have a Medical Advance Directive? No No No No No No Yes  Would patient like information on creating a medical advance directive? No - Patient declined No - Patient declined Yes (MAU/Ambulatory/Procedural Areas - Information given) - Yes - Educational materials given No - patient declined information -    Tobacco Social History   Tobacco Use  Smoking Status Never Smoker  Smokeless Tobacco Never Used     Counseling given: No   Clinical Intake:  Pre-visit preparation completed: Yes  Pain : No/denies pain Pain Score: 0-No pain     Nutritional Status: BMI 25 -29 Overweight Diabetes: No  How often do you need to have someone help you when you read instructions, pamphlets, or other written materials from your doctor or pharmacy?: 1 - Never What is the last grade level you completed in school?: 12th grade + 1 yr college  Interpreter Needed?: No  Comments: pt lives with spouse Information entered by :: LPinson, LPN  Past Medical History:  Diagnosis Date  . Colitis   . Coronary artery disease   . DJD (degenerative joint disease)   . Gallbladder disease   . GERD (gastroesophageal reflux disease)   . Hearing loss   . Heart murmur   . Hyperlipidemia   . Hypertension   . IBS (irritable bowel syndrome)   . Increased prostate specific antigen (PSA) velocity    Past Surgical History:  Procedure Laterality Date  . CARDIAC CATHETERIZATION    . CARDIAC CATHETERIZATION N/A 01/07/2015   Procedure: Left  Heart Cath and Coronary Angiography;  Surgeon: [name], MD;  Location: [facility];  Service: Cardiovascular;  Laterality: N/A;  . CARDIAC CATHETERIZATION N/A 01/07/2015   Procedure: Coronary Stent Intervention;  Surgeon: [name], MD;  Location: [facility];  Service: Cardiovascular;  Laterality: N/A;  . COLONOSCOPY    . CORONARY STENT PLACEMENT     Dr. [name]  . CORONARY STENT PLACEMENT  01/07/2015   LAD  . TONSILLECTOMY AND ADENOIDECTOMY     in the 2nd grade   Family History  Problem Relation Age of Onset  . Stroke Mother   . Colon cancer Neg Hx   . Esophageal cancer Neg Hx   . Rectal cancer Neg Hx   . Stomach cancer Neg Hx    Social History   Socioeconomic History  . Marital status: Married    Spouse name: linda wrenn Beaubrun  . Number of children: 2  . Years of education: Not on file  . Highest education level: Not on file  Occupational History  . Occupation: Engineer, production    Comment: Retired  Scientific laboratory technician  . Financial resource strain: Not on file  . Food insecurity:    Worry: Not on file    Inability: Not on file  . Transportation needs:    Medical: Not on file    Non-medical: Not on file  Tobacco Use  . Smoking status: Never Smoker  . Smokeless tobacco: Never Used  Substance and Sexual Activity  .  Alcohol use: Yes    Alcohol/week: 0.0 standard drinks    Comment: rarely drinks beer/approx. 1 case per year  . Drug use: No  . Sexual activity: Yes  Lifestyle  . Physical activity:    Days per week: Not on file    Minutes per session: Not on file  . Stress: Not on file  Relationships  . Social connections:    Talks on phone: Not on file    Gets together: Not on file    Attends religious service: Not on file    Active member of club or organization: Not on file    Attends meetings of clubs or organizations: Not on file    Relationship status: Not on file  Other Topics Concern  . Not on file  Social History Narrative  . Not on  file    Outpatient Encounter Medications as of 01/01/2019  Medication Sig  . aspirin EC 81 MG tablet Take 1 tablet (81 mg total) by mouth daily.  Marland Kitchen atorvastatin (LIPITOR) 40 MG tablet TAKE 1 TABLET BY MOUTH EVERY DAY  . clopidogrel (PLAVIX) 75 MG tablet TAKE 1 TABLET EVERY DAY  . metoprolol tartrate (LOPRESSOR) 25 MG tablet TAKE 1 TABLET BY MOUTH TWICE A DAY  . Multiple Vitamins-Minerals (MULTIVITAMIN & MINERAL PO) Take 1 tablet by mouth daily.  . nitroGLYCERIN (NITROSTAT) 0.4 MG SL tablet Place 1 tablet (0.4 mg total) under the tongue every 5 (five) minutes as needed for chest pain.  . pantoprazole (PROTONIX) 40 MG tablet TAKE 1 TABLET BY MOUTH EVERY DAY  . polyethylene glycol (MIRALAX) packet Take 17 g by mouth daily.  . ramipril (ALTACE) 10 MG capsule TAKE 1 CAPSULE BY MOUTH EVERY DAY  . [DISCONTINUED] hydrocortisone (ANUSOL-HC) 25 MG suppository Place 1 suppository (25 mg total) rectally 2 (two) times daily.  . [DISCONTINUED] Omega-3 Krill Oil 500 MG CAPS Take 1 capsule by mouth daily.   Facility-Administered Encounter Medications as of 01/01/2019  Medication  . 0.9 %  sodium chloride infusion    Activities of Daily Living In your present state of health, do you have any difficulty performing the following activities: 01/01/2019  Hearing? N  Vision? N  Difficulty concentrating or making decisions? N  Walking or climbing stairs? N  Dressing or bathing? N  Doing errands, shopping? N  Preparing Food and eating ? N  Using the Toilet? N  In the past six months, have you accidently leaked urine? N  Do you have problems with loss of bowel control? N  Managing your Medications? N  Managing your Finances? N  Housekeeping or managing your Housekeeping? N  Some recent data might be hidden    Patient Care Team: Tower, Wynelle Fanny, MD as PCP - General (Family Medicine) [name], MD as PCP - Cardiology (Cardiology) Ralene Bathe, MD as Consulting Physician (Ophthalmology) [name], MD as Consulting Physician (Cardiology)   Assessment:   This is a routine wellness examination for Brian Todd.  Vision Screening Comments: Vision exam in 2019 @ [facility]  Exercise Activities and Dietary recommendations Current Exercise Habits: The patient does not participate in regular exercise at present(yard work as needed), Exercise limited by: None identified  Goals    . Patient Stated     Starting 01/01/2019, I will continue to remain active by doing yard work as needed.        Fall Risk Fall Risk  01/01/2019 12/12/2017 12/01/2016 12/01/2015 11/17/2015  Falls in the past year? 0 No  No No No   Depression Screen PHQ 2/9 Scores 01/01/2019 12/12/2017 12/01/2016 12/01/2015  PHQ - 2 Score 0 0 0 0  PHQ- 9 Score 0 0 - -    Cognitive Function MMSE - Mini Mental State Exam 01/01/2019 12/12/2017 12/01/2016 12/01/2015  Orientation to time 5 5 5 5   Orientation to Place 5 5 5 5   Registration 3 3 3 3   Attention/ Calculation 0 0 0 0  Recall 3 2 3 3   Recall-comments - unable to recall 1 of 3 words - -  Language- name 2 objects 0 0 0 0  Language- repeat 1 1 1 1   Language- follow 3 step command 0 3 3 3   Language- read & follow direction 0 0 0 0  Write a sentence 0 0 0 0  Copy design 0 0 0 0  Total score 17 19 20 20      PLEASE NOTE: A Mini-Cog screen was completed. Maximum score is 17. A value of 0 denotes this part of Folstein MMSE was not completed or the patient failed this part of the Mini-Cog screening.   Mini-Cog Screening Orientation to Time - Max 5 pts Orientation to Place - Max 5 pts Registration - Max 3 pts Recall - Max 3 pts Language Repeat - Max 1 pts     Immunization History  Administered Date(s) Administered  . Influenza Split 04/16/2011, 04/26/2012  . Influenza Whole 03/25/2009, 03/25/2010  . Influenza, High Dose Seasonal PF 05/13/2017, 04/11/2018  . Influenza, Seasonal, Injecte, Preservative Fre 04/28/2016  . Influenza,inj,Quad PF,6+ Mos 04/10/2013,  04/02/2014, 05/04/2015  . Pneumococcal Conjugate-13 10/31/2014  . Pneumococcal Polysaccharide-23 09/16/2013  . Td 09/02/2010   Screening Tests Health Maintenance  Topic Date Due  . INFLUENZA VACCINE  02/23/2019  . TETANUS/TDAP  09/02/2020  . Hepatitis C Screening  Completed  . PNA vac Low Risk Adult  Completed       Plan:     I have personally reviewed, addressed, and noted the following in the patient's chart:  A. Medical and social history B. Use of alcohol, tobacco or illicit drugs  C. Current medications and supplements D. Functional ability and status E.  Nutritional status F.  Physical activity G. Advance directives H. List of other physicians I.  Hospitalizations, surgeries, and ER visits in previous 12 months J.  Vitals (unless it is a telemedicine encounter) K. Screenings to include cognitive, depression, hearing, vision (NOTE: hearing and vision screenings not completed in telemedicine encounter) L. Referrals and appointments   In addition, I have reviewed and discussed with patient certain preventive protocols, quality metrics, and best practice recommendations. A written personalized care plan for preventive services and recommendations were provided to patient.  With patient's permission, we connected on 01/01/19 at  8:00 AM EDT by a video enabled telemedicine application. Two patient identifiers were used to ensure the encounter occurred with the correct person.    Patient was in home and writer was in office.   Signed,   Lindell Noe, MHA, BS, RN Health Coach

## 2019-01-01 NOTE — Patient Instructions (Signed)
[name] , Thank you for taking time to come for your Medicare Wellness Visit. I appreciate your ongoing commitment to your health goals. Please review the following plan we discussed and let me know if I can assist you in the future.   These are the goals we discussed: Goals    . Patient Stated     Starting 01/01/2019, I will continue to remain active by doing yard work as needed.        This is a list of the screening recommended for you and due dates:  Health Maintenance  Topic Date Due  . Flu Shot  02/23/2019  . Tetanus Vaccine  09/02/2020  .  Hepatitis C: One time screening is recommended by Center for Disease Control  (CDC) for  adults born from [birth year] through 1963.   Completed  . Pneumonia vaccines  Completed   Preventive Care for Adults  A healthy lifestyle and preventive care can promote health and wellness. Preventive health guidelines for adults include the following key practices.  . A routine yearly physical is a good way to check with your health care provider about your health and preventive screening. It is a chance to share any concerns and updates on your health and to receive a thorough exam.  . Visit your dentist for a routine exam and preventive care every 6 months. Brush your teeth twice a day and floss once a day. Good oral hygiene prevents tooth decay and gum disease.  . The frequency of eye exams is based on your age, health, family medical history, use  of contact lenses, and other factors. Follow your health care provider's recommendations for frequency of eye exams.  . Eat a healthy diet. Foods like vegetables, fruits, whole grains, low-fat dairy products, and lean protein foods contain the nutrients you need without too many calories. Decrease your intake of foods high in solid fats, added sugars, and salt. Eat the right amount of calories for you. Get information about a proper diet from your health care provider, if necessary.  . Regular physical exercise  is one of the most important things you can do for your health. Most adults should get at least 150 minutes of moderate-intensity exercise (any activity that increases your heart rate and causes you to sweat) each week. In addition, most adults need muscle-strengthening exercises on 2 or more days a week.  Silver Sneakers may be a benefit available to you. To determine eligibility, you may visit the website: www.silversneakers.com or contact program at 229 145 7852 Mon-Fri between 8AM-8PM.   . Maintain a healthy weight. The body mass index (BMI) is a screening tool to identify possible weight problems. It provides an estimate of body fat based on height and weight. Your health care provider can find your BMI and can help you achieve or maintain a healthy weight.   For adults 20 years and older: ? A BMI below 18.5 is considered underweight. ? A BMI of 18.5 to 24.9 is normal. ? A BMI of 25 to 29.9 is considered overweight. ? A BMI of 30 and above is considered obese.   . Maintain normal blood lipids and cholesterol levels by exercising and minimizing your intake of saturated fat. Eat a balanced diet with plenty of fruit and vegetables. Blood tests for lipids and cholesterol should begin at age 2 and be repeated every 5 years. If your lipid or cholesterol levels are high, you are over 50, or you are at high risk for heart disease, you  may need your cholesterol levels checked more frequently. Ongoing high lipid and cholesterol levels should be treated with medicines if diet and exercise are not working.  . If you smoke, find out from your health care provider how to quit. If you do not use tobacco, please do not start.  . If you choose to drink alcohol, please do not consume more than 2 drinks per day. One drink is considered to be 12 ounces (355 mL) of beer, 5 ounces (148 mL) of wine, or 1.5 ounces (44 mL) of liquor.  . If you are 14-56 years old, ask your health care provider if you should take  aspirin to prevent strokes.  . Use sunscreen. Apply sunscreen liberally and repeatedly throughout the day. You should seek shade when your shadow is shorter than you. Protect yourself by wearing long sleeves, pants, a wide-brimmed hat, and sunglasses year round, whenever you are outdoors.  . Once a month, do a whole body skin exam, using a mirror to look at the skin on your back. Tell your health care provider of new moles, moles that have irregular borders, moles that are larger than a pencil eraser, or moles that have changed in shape or color.

## 2019-01-04 ENCOUNTER — Encounter: Payer: Self-pay | Admitting: Family Medicine

## 2019-01-04 ENCOUNTER — Ambulatory Visit (INDEPENDENT_AMBULATORY_CARE_PROVIDER_SITE_OTHER): Payer: Medicare HMO | Admitting: Family Medicine

## 2019-01-04 VITALS — BP 120/61 | HR 51 | Wt 183.0 lb

## 2019-01-04 DIAGNOSIS — K219 Gastro-esophageal reflux disease without esophagitis: Secondary | ICD-10-CM

## 2019-01-04 DIAGNOSIS — E7800 Pure hypercholesterolemia, unspecified: Secondary | ICD-10-CM

## 2019-01-04 DIAGNOSIS — I10 Essential (primary) hypertension: Secondary | ICD-10-CM | POA: Diagnosis not present

## 2019-01-04 DIAGNOSIS — I700 Atherosclerosis of aorta: Secondary | ICD-10-CM

## 2019-01-04 DIAGNOSIS — I2510 Atherosclerotic heart disease of native coronary artery without angina pectoris: Secondary | ICD-10-CM | POA: Diagnosis not present

## 2019-01-04 DIAGNOSIS — Z125 Encounter for screening for malignant neoplasm of prostate: Secondary | ICD-10-CM

## 2019-01-04 NOTE — Patient Instructions (Signed)
Stay active in and outdoors  Be careful in the heat-increase fluids when needed  Labs are re assuring  Good job with weight loss  No change in medication  Avoid red meat/ fried foods/ egg yolks/ fatty breakfast meats/ butter, cheese and high fat dairy/ and shellfish    Follow up with cardiology as planned

## 2019-01-04 NOTE — Assessment & Plan Note (Signed)
Well controlled  Good bp and cholesterol  For cardiology f/u in July  Continues statin and plavix

## 2019-01-04 NOTE — Assessment & Plan Note (Signed)
No symptoms  Also CAD-no clinical changes Cholesterol and bp are well controlled

## 2019-01-04 NOTE — Progress Notes (Signed)
Virtual Visit via Video Note  I connected with Brian Todd on 01/04/19 at 10:15 AM EDT by a video enabled telemedicine application and verified that I am speaking with the correct person using two identifiers.  Location: Patient: home Provider: office    I discussed the limitations of evaluation and management by telemedicine and the availability of in person appointments. The patient expressed understanding and agreed to proceed.  History of Present Illness: Pt presents for annual f/u of chronic medical problems  Feeling fine for the most part  Staying home during the pandemic    amw 6/9 No care gaps /concerns Mentioned headaches poss from eye medication (it was an antibiotic treating a stye) -it caused a mild headache (doxycycline)  Everything is cleared up    Weight  183.9 lb  Wt Readings from Last 3 Encounters:  01/01/19 183 lb 14.4 oz (83.4 kg)  11/29/18 202 lb (91.6 kg)  10/04/18 202 lb (91.6 kg)  no suspected ht changes     Prostate health  Lab Results  Component Value Date   PSA 2.37 01/01/2019   PSA 2.25 12/12/2017   PSA 2.22 12/01/2016   no symptoms at all  No prostate cancer in the family   utd imms  Has not had shingles    HTN in setting of CAD/ aortic atherosclerosis  BP Readings from Last 3 Encounters:  11/29/18 (!) 109/53  10/04/18 (!) 114/58  09/28/18 140/66  bp at home 120/61 today Pulse 51   Exercise - has been working outside for the most part Careful in the heat   Diet -has been better / wife is cooking healthy for the most part  Not getting carry out  No beef    Lab Results  Component Value Date   CREATININE 1.03 01/01/2019   BUN 22 01/01/2019   NA 139 01/01/2019   K 4.5 01/01/2019   CL 105 01/01/2019   CO2 27 01/01/2019   Lab Results  Component Value Date   ALT 19 01/01/2019   AST 21 01/01/2019   ALKPHOS 104 01/01/2019   BILITOT 0.8 01/01/2019   Lab Results  Component Value Date   WBC 5.6 01/01/2019   HGB 15.1  01/01/2019   HCT 45.6 01/01/2019   MCV 93.6 01/01/2019   PLT 184.0 01/01/2019    Lab Results  Component Value Date   TSH 2.55 01/01/2019     GERD-doing very well for the most part Has to watch his diet  protonix   Drinking lots of water   Hyperlipidemia  Lab Results  Component Value Date   CHOL 120 01/01/2019   CHOL 126 12/12/2017   CHOL 110 12/01/2016   Lab Results  Component Value Date   HDL 43.10 01/01/2019   HDL 36.70 (L) 12/12/2017   HDL 43.80 12/01/2016   Lab Results  Component Value Date   LDLCALC 58 01/01/2019   LDLCALC 50 12/12/2017   LDLCALC 49 12/01/2016   Lab Results  Component Value Date   TRIG 93.0 01/01/2019   TRIG 197.0 (H) 12/12/2017   TRIG 87.0 12/01/2016   Lab Results  Component Value Date   CHOLHDL 3 01/01/2019   CHOLHDL 3 12/12/2017   CHOLHDL 3 12/01/2016   Lab Results  Component Value Date   LDLDIRECT 76.5 09/02/2010  atorvastatin  Also watching diet   Has cardiology f/u planned for July   Review of Systems  Constitutional: Positive for weight loss. Negative for chills, diaphoresis, fever and malaise/fatigue.  HENT:  Negative for congestion.   Eyes: Negative for blurred vision.  Respiratory: Negative for cough and shortness of breath.   Cardiovascular: Negative for chest pain, palpitations and leg swelling.  Gastrointestinal: Negative for abdominal pain, blood in stool and diarrhea.  Musculoskeletal:       Some aches and pains - suspects age related  Skin: Negative for rash.  Neurological: Negative for dizziness and headaches.  Psychiatric/Behavioral: Negative for depression. The patient is not nervous/anxious.       Patient Active Problem List   Diagnosis Date Noted  . Aortic atherosclerosis ([place]) 09/30/2018  . Intractable left heel pain 09/28/2018  . Constipation 09/28/2018  . Prostate cancer screening 11/26/2016  . Routine general medical examination at a health care facility 11/17/2015  . Low back pain 07/10/2015  .  Coronary artery disease involving native coronary artery without angina pectoris 04/09/2015  . History of heart artery stent 01/01/2015  . History of colitis 11/08/2014  . Encounter for Medicare annual wellness exam 10/31/2014  . DEGENERATIVE JOINT DISEASE 07/30/2009  . HEARING LOSS, MILD 11/27/2007  . IRRITABLE BOWEL SYNDROME 11/27/2007  . Hyperlipidemia 08/13/2007  . Essential hypertension 08/13/2007  . GERD 08/13/2007   Past Medical History:  Diagnosis Date  . Colitis   . Coronary artery disease   . DJD (degenerative joint disease)   . Gallbladder disease   . GERD (gastroesophageal reflux disease)   . Hearing loss   . Heart murmur   . Hyperlipidemia   . Hypertension   . IBS (irritable bowel syndrome)   . Increased prostate specific antigen (PSA) velocity    Past Surgical History:  Procedure Laterality Date  . CARDIAC CATHETERIZATION    . CARDIAC CATHETERIZATION N/A 01/07/2015   Procedure: Left Heart Cath and Coronary Angiography;  Surgeon: [name], MD;  Location: [facility];  Service: Cardiovascular;  Laterality: N/A;  . CARDIAC CATHETERIZATION N/A 01/07/2015   Procedure: Coronary Stent Intervention;  Surgeon: [name], MD;  Location: [facility];  Service: Cardiovascular;  Laterality: N/A;  . COLONOSCOPY    . CORONARY STENT PLACEMENT     Dr. [name]  . CORONARY STENT PLACEMENT  01/07/2015   LAD  . TONSILLECTOMY AND ADENOIDECTOMY     in the 2nd grade   Social History   Tobacco Use  . Smoking status: Never Smoker  . Smokeless tobacco: Never Used  Substance Use Topics  . Alcohol use: Yes    Alcohol/week: 0.0 standard drinks    Comment: rarely drinks beer/approx. 1 case per year  . Drug use: No   Family History  Problem Relation Age of Onset  . Stroke Mother   . Colon cancer Neg Hx   . Esophageal cancer Neg Hx   . Rectal cancer Neg Hx   . Stomach cancer Neg Hx    Allergies  Allergen Reactions  . Doxycycline     Headaches   .  Flagyl [Metronidazole]    Current Outpatient Medications on File Prior to Visit  Medication Sig Dispense Refill  . aspirin EC 81 MG tablet Take 1 tablet (81 mg total) by mouth daily.    Marland Kitchen atorvastatin (LIPITOR) 40 MG tablet TAKE 1 TABLET BY MOUTH EVERY DAY 90 tablet 0  . clopidogrel (PLAVIX) 75 MG tablet TAKE 1 TABLET EVERY DAY 90 tablet 3  . metoprolol tartrate (LOPRESSOR) 25 MG tablet TAKE 1 TABLET BY MOUTH TWICE A DAY 180 tablet 3  . Multiple Vitamins-Minerals (MULTIVITAMIN & MINERAL PO) Take  1 tablet by mouth daily.    . nitroGLYCERIN (NITROSTAT) 0.4 MG SL tablet Place 1 tablet (0.4 mg total) under the tongue every 5 (five) minutes as needed for chest pain. 25 tablet 3  . pantoprazole (PROTONIX) 40 MG tablet TAKE 1 TABLET BY MOUTH EVERY DAY 90 tablet 0  . polyethylene glycol (MIRALAX) packet Take 17 g by mouth daily. 14 each 0  . ramipril (ALTACE) 10 MG capsule TAKE 1 CAPSULE BY MOUTH EVERY DAY 90 capsule 0   No current facility-administered medications on file prior to visit.     Observations/Objective: Patient appears well, in no distress Weight is baseline  No facial swelling or asymmetry Normal voice-not hoarse and no slurred speech No obvious tremor or mobility impairment Moving neck and UEs normally Able to hear the call well  No cough or shortness of breath during interview  Talkative and mentally sharp with no cognitive changes No skin changes on face or neck , no rash or pallor Affect is normal    Assessment and Plan: Problem List Items Addressed This Visit      Cardiovascular and Mediastinum   Essential hypertension - Primary    bp in fair control at this time  BP Readings from Last 1 Encounters:  01/04/19 120/61   No changes needed Most recent labs reviewed  Disc lifstyle change with low sodium diet and exercise        Coronary artery disease involving native coronary artery without angina pectoris    Well controlled  Good bp and cholesterol  For  cardiology f/u in July  Continues statin and plavix      Aortic atherosclerosis ([place])    No symptoms  Also CAD-no clinical changes Cholesterol and bp are well controlled        Digestive   GERD     Other   Hyperlipidemia    Disc goals for lipids and reasons to control them Rev last labs with pt Rev low sat fat diet in detail Taking atorvastatin and eating well /healthy  Disc limiting red meat       Prostate cancer screening    No family hx Lab Results  Component Value Date   PSA 2.37 01/01/2019   PSA 2.25 12/12/2017   PSA 2.22 12/01/2016   No voiding symptoms           Follow Up Instructions: Stay active in and outdoors  Be careful in the heat-increase fluids when needed  Labs are re assuring  Good job with weight loss  No change in medication  Avoid red meat/ fried foods/ egg yolks/ fatty breakfast meats/ butter, cheese and high fat dairy/ and shellfish    Follow up with cardiology as planned   I discussed the assessment and treatment plan with the patient. The patient was provided an opportunity to ask questions and all were answered. The patient agreed with the plan and demonstrated an understanding of the instructions.   The patient was advised to call back or seek an in-person evaluation if the symptoms worsen or if the condition fails to improve as anticipated.    [name], MD

## 2019-01-04 NOTE — Assessment & Plan Note (Signed)
No family hx Lab Results  Component Value Date   PSA 2.37 01/01/2019   PSA 2.25 12/12/2017   PSA 2.22 12/01/2016   No voiding symptoms

## 2019-01-04 NOTE — Assessment & Plan Note (Signed)
bp in fair control at this time  BP Readings from Last 1 Encounters:  01/04/19 120/61   No changes needed Most recent labs reviewed  Disc lifstyle change with low sodium diet and exercise

## 2019-01-04 NOTE — Assessment & Plan Note (Signed)
Disc goals for lipids and reasons to control them Rev last labs with pt Rev low sat fat diet in detail Taking atorvastatin and eating well /healthy  Disc limiting red meat

## 2019-01-14 ENCOUNTER — Telehealth: Payer: Self-pay

## 2019-01-14 MED ORDER — NITROGLYCERIN 0.4 MG SL SUBL: 0.4000 mg | SUBLINGUAL_TABLET | SUBLINGUAL | 0 refills | Status: DC | PRN

## 2019-01-14 NOTE — Telephone Encounter (Signed)
Requested Prescriptions   Signed Prescriptions Disp Refills  . nitroGLYCERIN (NITROSTAT) 0.4 MG SL tablet 25 tablet 0    Sig: Place 1 tablet (0.4 mg total) under the tongue every 5 (five) minutes as needed for chest pain.    Authorizing Provider: Kathlyn Sacramento A    Ordering User: Raelene Bott, Zaley Talley L

## 2019-01-14 NOTE — Telephone Encounter (Signed)
Virtual Visit Pre-Appointment Phone Call  "Brian Todd, I am calling you today to discuss your upcoming appointment. We are currently trying to limit exposure to the virus that causes COVID-19 by seeing patients at home rather than in the office."  1. "What is the BEST phone number to call the day of the visit?" - include this in appointment notes  2. "Do you have or have access to (through a family member/friend) a smartphone with video capability that we can use for your visit?" a. If yes - list this number in appt notes as "cell" (if different from BEST phone #) and list the appointment type as a VIDEO visit in appointment notes b. If no - list the appointment type as a PHONE visit in appointment notes  3. Confirm consent - "In the setting of the current Covid19 crisis, you are scheduled for a video visit with your provider on 02/19/2019 at 1:40PM.  Just as we do with many in-office visits, in order for you to participate in this visit, we must obtain consent.  If you'd like, I can send this to your mychart (if signed up) or email for you to review.  Otherwise, I can obtain your verbal consent now.  All virtual visits are billed to your insurance company just like a normal visit would be.  By agreeing to a virtual visit, we'd like you to understand that the technology does not allow for your provider to perform an examination, and thus may limit your provider's ability to fully assess your condition. If your provider identifies any concerns that need to be evaluated in person, we will make arrangements to do so.  Finally, though the technology is pretty good, we cannot assure that it will always work on either your or our end, and in the setting of a video visit, we may have to convert it to a phone-only visit.  In either situation, we cannot ensure that we have a secure connection.  Are you willing to proceed?" STAFF: Did the patient verbally acknowledge consent to telehealth visit? Document YES/NO here:  YES  4. Advise patient to be prepared - "Two hours prior to your appointment, go ahead and check your blood pressure, pulse, oxygen saturation, and your weight (if you have the equipment to check those) and write them all down. When your visit starts, your provider will ask you for this information. If you have an Apple Watch or Kardia device, please plan to have heart rate information ready on the day of your appointment. Please have a pen and paper handy nearby the day of the visit as well."  5. Give patient instructions for MyChart download to smartphone OR [email or web address] as below if video visit (depending on what platform provider is using)  6. Inform patient they will receive a phone call 15 minutes prior to their appointment time (may be from unknown caller ID) so they should be prepared to answer    TELEPHONE CALL NOTE  ALOK MINSHALL has been deemed a candidate for a follow-up tele-health visit to limit community exposure during the Covid-19 pandemic. I spoke with the patient via phone to ensure availability of phone/video source, confirm preferred email & phone number, and discuss instructions and expectations.  I reminded GARFIELD COINER to be prepared with any vital sign and/or heart rhythm information that could potentially be obtained via home monitoring, at the time of his visit. I reminded ARCHIT LEGER to expect a phone call prior to his visit.  Rene Paci McClain 01/14/2019 2:29 PM   INSTRUCTIONS FOR DOWNLOADING THE MYCHART APP TO SMARTPHONE  - The patient must first make sure to have activated MyChart and know their login information - If Apple, go to [employer] and type in MyChart in the search bar and download the app. If Android, ask patient to go to [employer] and type in [place] in the search bar and download the app. The app is free but as with any other app downloads, their phone may require them to verify saved payment information or Apple/Android  password.  - The patient will need to then log into the app with their MyChart username and password, and select [facility] as their healthcare provider to link the account. When it is time for your visit, go to the MyChart app, find appointments, and click Begin Video Visit. Be sure to Select Allow for your device to access the Microphone and Camera for your visit. You will then be connected, and your provider will be with you shortly.  **If they have any issues connecting, or need assistance please contact MyChart service desk [phone])**  **If using a computer, in order to ensure the best quality for their visit they will need to use either of the following Internet Browsers: [employer], or Google Chrome**  IF USING DOXIMITY or DOXY.ME - The patient will receive a link just prior to their visit by text.     FULL LENGTH CONSENT FOR TELE-HEALTH VISIT   I hereby voluntarily request, consent and authorize [facility] and its employed or contracted physicians, physician assistants, nurse practitioners or other licensed health care professionals (the Practitioner), to provide me with telemedicine health care services (the "Services") as deemed necessary by the treating Practitioner. I acknowledge and consent to receive the Services by the Practitioner via telemedicine. I understand that the telemedicine visit will involve communicating with the Practitioner through live audiovisual communication technology and the disclosure of certain medical information by electronic transmission. I acknowledge that I have been given the opportunity to request an in-person assessment or other available alternative prior to the telemedicine visit and am voluntarily participating in the telemedicine visit.  I understand that I have the right to withhold or withdraw my consent to the use of telemedicine in the course of my care at any time, without affecting my right to future care or treatment,  and that the Practitioner or I may terminate the telemedicine visit at any time. I understand that I have the right to inspect all information obtained and/or recorded in the course of the telemedicine visit and may receive copies of available information for a reasonable fee.  I understand that some of the potential risks of receiving the Services via telemedicine include:  Marland Kitchen Delay or interruption in medical evaluation due to technological equipment failure or disruption; . Information transmitted may not be sufficient (e.g. poor resolution of images) to allow for appropriate medical decision making by the Practitioner; and/or  . In rare instances, security protocols could fail, causing a breach of personal health information.  Furthermore, I acknowledge that it is my responsibility to provide information about my medical history, conditions and care that is complete and accurate to the best of my ability. I acknowledge that Practitioner's advice, recommendations, and/or decision may be based on factors not within their control, such as incomplete or inaccurate data provided by me or distortions of diagnostic images or specimens that may result from electronic transmissions. I understand that  the practice of medicine is not an exact science and that Practitioner makes no warranties or guarantees regarding treatment outcomes. I acknowledge that I will receive a copy of this consent concurrently upon execution via email to the email address I last provided but may also request a printed copy by calling the office of [facility].    I understand that my insurance will be billed for this visit.   I have read or had this consent read to me. . I understand the contents of this consent, which adequately explains the benefits and risks of the Services being provided via telemedicine.  . I have been provided ample opportunity to ask questions regarding this consent and the Services and have had my questions  answered to my satisfaction. . I give my informed consent for the services to be provided through the use of telemedicine in my medical care  By participating in this telemedicine visit I agree to the above.

## 2019-02-12 ENCOUNTER — Other Ambulatory Visit: Payer: Self-pay | Admitting: Cardiovascular Disease

## 2019-02-12 MED ORDER — NITROGLYCERIN 0.4 MG SL SUBL: 0.4000 mg | SUBLINGUAL_TABLET | SUBLINGUAL | 0 refills | Status: DC | PRN

## 2019-02-12 NOTE — Telephone Encounter (Signed)
°*  STAT* If patient is at the pharmacy, call can be transferred to refill team.   1. Which medications need to be refilled? (please list name of each medication and dose if known) nitroglycerin 0.4 MG as needed   2. Which pharmacy/location (including street and city if local pharmacy) is medication to be sent to? [facility]   3. Do they need a 30 day or 90 day supply? 30 day   Patient says this was filled not too long ago but patient has misplaced them and cannot find them.

## 2019-02-12 NOTE — Telephone Encounter (Signed)
Requested Prescriptions   Signed Prescriptions Disp Refills  . nitroGLYCERIN (NITROSTAT) 0.4 MG SL tablet 25 tablet 0    Sig: Place 1 tablet (0.4 mg total) under the tongue every 5 (five) minutes as needed for chest pain.    Authorizing Provider: Kathlyn [place] A    Ordering User: Raelene Bott, BRANDY L

## 2019-02-19 ENCOUNTER — Ambulatory Visit: Payer: Medicare HMO | Admitting: Cardiovascular Disease

## 2019-02-19 ENCOUNTER — Encounter: Payer: Self-pay | Admitting: Cardiovascular Disease

## 2019-02-19 ENCOUNTER — Other Ambulatory Visit: Payer: Self-pay

## 2019-02-19 ENCOUNTER — Telehealth (INDEPENDENT_AMBULATORY_CARE_PROVIDER_SITE_OTHER): Payer: Medicare HMO | Admitting: Cardiovascular Disease

## 2019-02-19 VITALS — BP 116/62 | HR 50 | Ht 68.0 in | Wt 175.4 lb

## 2019-02-19 DIAGNOSIS — I10 Essential (primary) hypertension: Secondary | ICD-10-CM

## 2019-02-19 DIAGNOSIS — I2510 Atherosclerotic heart disease of native coronary artery without angina pectoris: Secondary | ICD-10-CM

## 2019-02-19 DIAGNOSIS — E7800 Pure hypercholesterolemia, unspecified: Secondary | ICD-10-CM

## 2019-02-19 NOTE — Progress Notes (Signed)
Virtual Visit via Video Note   This visit type was conducted due to national recommendations for restrictions regarding the COVID-19 Pandemic (e.g. social distancing) in an effort to limit this patient's exposure and mitigate transmission in our community.  Due to his co-morbid illnesses, this patient is at least at moderate risk for complications without adequate follow up.  This format is felt to be most appropriate for this patient at this time.  All issues noted in this document were discussed and addressed.  A limited physical exam was performed with this format.  Please refer to the patient's chart for his consent to telehealth for [facility].   Date:  02/19/2019   ID:  Brian Todd, DOB 1946/04/03, MRN [id number]  Patient Location: Home Provider Location: Office  PCP:  Abner Greenspan, MD  Cardiologist:  Kathlyn Sacramento, MD  Electrophysiologist:  None   Evaluation Performed:  Follow-Up Visit  Chief Complaint: Doing well with no complaints.  History of Present Illness:    Brian Todd is a 73 y.o. male who was seen via video visit for follow-up regarding coronary artery disease. He had a small non-ST elevation myocardial infarction in 2001. Cardiac catheterization showed a 95% mid LAD stenosis and 40% stenosis in OM branch. He had successful angioplasty and Taxus drug-eluting stent placement to the mid LAD without complications.  He had recurrent angina in June 2016. Cardiac catheterization showed 99% in-stent restenosis in the mid LAD extending to the distal edge, 60% mid left circumflex stenosis and mild RCA disease. I performed successful angioplasty and drug-eluting stent placement to the mid LAD.  He has other chronic medical conditions that include hypertension, hyperlipidemia and gastroesophageal reflux disease. He has been doing extremely well from a cardiac standpoint with no chest pain, shortness of breath or palpitations.  He is mildly bradycardic but denies any  dizziness, syncope or presyncope.  The patient does not have symptoms concerning for COVID-19 infection (fever, chills, cough, or new shortness of breath).    Past Medical History:  Diagnosis Date  . Colitis   . Coronary artery disease   . DJD (degenerative joint disease)   . Gallbladder disease   . GERD (gastroesophageal reflux disease)   . Hearing loss   . Heart murmur   . Hyperlipidemia   . Hypertension   . IBS (irritable bowel syndrome)   . Increased prostate specific antigen (PSA) velocity    Past Surgical History:  Procedure Laterality Date  . CARDIAC CATHETERIZATION    . CARDIAC CATHETERIZATION N/A 01/07/2015   Procedure: Left Heart Cath and Coronary Angiography;  Surgeon: [name], MD;  Location: [facility];  Service: Cardiovascular;  Laterality: N/A;  . CARDIAC CATHETERIZATION N/A 01/07/2015   Procedure: Coronary Stent Intervention;  Surgeon: [name], MD;  Location: [facility];  Service: Cardiovascular;  Laterality: N/A;  . COLONOSCOPY    . CORONARY STENT PLACEMENT     Dr. [name]  . CORONARY STENT PLACEMENT  01/07/2015   LAD  . TONSILLECTOMY AND ADENOIDECTOMY     in the 2nd grade     Current Meds  Medication Sig  . aspirin EC 81 MG tablet Take 1 tablet (81 mg total) by mouth daily.  Marland Kitchen atorvastatin (LIPITOR) 40 MG tablet TAKE 1 TABLET BY MOUTH EVERY DAY  . clopidogrel (PLAVIX) 75 MG tablet TAKE 1 TABLET EVERY DAY  . metoprolol tartrate (LOPRESSOR) 25 MG tablet TAKE 1 TABLET BY MOUTH TWICE A DAY  .  Multiple Vitamins-Minerals (MULTIVITAMIN & MINERAL PO) Take 1 tablet by mouth daily.  . nitroGLYCERIN (NITROSTAT) 0.4 MG SL tablet Place 1 tablet (0.4 mg total) under the tongue every 5 (five) minutes as needed for chest pain.  . pantoprazole (PROTONIX) 40 MG tablet TAKE 1 TABLET BY MOUTH EVERY DAY  . polyethylene glycol (MIRALAX) packet Take 17 g by mouth daily.  . ramipril (ALTACE) 10 MG capsule TAKE 1 CAPSULE BY MOUTH EVERY DAY      Allergies:   Doxycycline and Flagyl [metronidazole]   Social History   Tobacco Use  . Smoking status: Never Smoker  . Smokeless tobacco: Never Used  Substance Use Topics  . Alcohol use: Yes    Alcohol/week: 0.0 standard drinks    Comment: rarely drinks beer/approx. 1 case per year  . Drug use: No     Family Hx: The patient's family history includes Stroke in his mother. There is no history of Colon cancer, Esophageal cancer, Rectal cancer, or Stomach cancer.  ROS:   Please see the history of present illness.     All other systems reviewed and are negative.   Prior CV studies:   The following studies were reviewed today:    Labs/Other Tests and Data Reviewed:    EKG:  No ECG reviewed.  Recent Labs: 01/01/2019: ALT 19; BUN 22; Creatinine, Ser 1.03; Hemoglobin 15.1; Platelets 184.0; Potassium 4.5; Sodium 139; TSH 2.55   Recent Lipid Panel Lab Results  Component Value Date/Time   CHOL 120 01/01/2019 10:20 AM   TRIG 93.0 01/01/2019 10:20 AM   HDL 43.10 01/01/2019 10:20 AM   CHOLHDL 3 01/01/2019 10:20 AM   LDLCALC 58 01/01/2019 10:20 AM   LDLDIRECT 76.5 09/02/2010 10:36 AM    Wt Readings from Last 3 Encounters:  02/19/19 175 lb 7 oz (79.6 kg)  01/04/19 183 lb (83 kg)  01/01/19 183 lb 14.4 oz (83.4 kg)     Objective:    Vital Signs:  BP 116/62 (BP Location: Left Arm, Patient Position: Sitting, Cuff Size: Normal)   Pulse (!) 50   Ht 5\' 8"  (1.727 m)   Wt 175 lb 7 oz (79.6 kg)   BMI 26.68 kg/m    VITAL SIGNS:  reviewed GEN:  no acute distress EYES:  sclerae anicteric, EOMI - Extraocular Movements Intact RESPIRATORY:  normal respiratory effort, symmetric expansion SKIN:  no rash, lesions or ulcers. MUSCULOSKELETAL:  no obvious deformities. NEURO:  alert and oriented x 3, no obvious focal deficit PSYCH:  normal affect  ASSESSMENT & PLAN:     1.  Coronary artery disease involving native coronary arteries without angina: The patient overall is doing well from  a cardiac standpoint. Given that he has a stent within a stent and also has a previous Taxus drug-eluting stent, I am planning to keep him on  dual antiplatelet therapy indefinitely as tolerated.  2. Hyperlipidemia: Continue high dose atorvastatin.  I reviewed recent lipid profile  which was optimal with an LDL of 58 and triglyceride of 93.  3. Essential hypertension: Blood pressure is well controlled on metoprolol and ramipril.  He has chronic bradycardia but overall is asymptomatic.  COVID-19 Education: The signs and symptoms of COVID-19 were discussed with the patient and how to seek care for testing (follow up with PCP or arrange E-visit).  The importance of social distancing was discussed today.  Time:   Today, I have spent 7 minutes with the patient with telehealth technology discussing the above problems.  Medication Adjustments/Labs and Tests Ordered: Current medicines are reviewed at length with the patient today.  Concerns regarding medicines are outlined above.   Tests Ordered: No orders of the defined types were placed in this encounter.   Medication Changes: No orders of the defined types were placed in this encounter.   Follow Up:  In Person in 1 year(s)  Signed, Kathlyn Sacramento, MD  02/19/2019 1:55 PM    [facility]

## 2019-02-19 NOTE — Patient Instructions (Signed)
Medication Instructions:  Continue same medications If you need a refill on your cardiac medications before your next appointment, please call your pharmacy.   Lab work: None If you have labs (blood work) drawn today and your tests are completely normal, you will receive your results only by: Marland Kitchen MyChart Message (if you have MyChart) OR . A paper copy in the mail If you have any lab test that is abnormal or we need to change your treatment, we will call you to review the results.  Testing/Procedures: None  Follow-Up: At [facility], you and your health needs are our priority.  As part of our continuing mission to provide you with exceptional heart care, we have created designated Provider Care Teams.  These Care Teams include your primary Cardiologist (physician) and Advanced Practice Providers (APPs -  Physician Assistants and Nurse Practitioners) who all work together to provide you with the care you need, when you need it. You will need a follow up appointment in 1 years.  Please call our office 2 months in advance to schedule this appointment.  You may see Kathlyn Sacramento, MD or one of the following Advanced Practice Providers on your designated Care Team:   Murray Hodgkins, NP Christell Faith, PA-C . Marrianne Mood, PA-C

## 2019-03-19 DIAGNOSIS — R69 Illness, unspecified: Secondary | ICD-10-CM | POA: Diagnosis not present

## 2019-03-25 ENCOUNTER — Other Ambulatory Visit: Payer: Self-pay | Admitting: Family Medicine

## 2019-03-28 ENCOUNTER — Ambulatory Visit (INDEPENDENT_AMBULATORY_CARE_PROVIDER_SITE_OTHER): Payer: Medicare HMO

## 2019-03-28 ENCOUNTER — Other Ambulatory Visit: Payer: Self-pay | Admitting: Cardiovascular Disease

## 2019-03-28 DIAGNOSIS — Z23 Encounter for immunization: Secondary | ICD-10-CM | POA: Diagnosis not present

## 2019-04-04 DIAGNOSIS — Z20828 Contact with and (suspected) exposure to other viral communicable diseases: Secondary | ICD-10-CM | POA: Diagnosis not present

## 2019-04-29 DIAGNOSIS — H900 Conductive hearing loss, bilateral: Secondary | ICD-10-CM | POA: Diagnosis not present

## 2019-04-29 DIAGNOSIS — H6123 Impacted cerumen, bilateral: Secondary | ICD-10-CM | POA: Diagnosis not present

## 2019-08-22 DIAGNOSIS — L814 Other melanin hyperpigmentation: Secondary | ICD-10-CM | POA: Diagnosis not present

## 2019-08-22 DIAGNOSIS — D1801 Hemangioma of skin and subcutaneous tissue: Secondary | ICD-10-CM | POA: Diagnosis not present

## 2019-08-22 DIAGNOSIS — D225 Melanocytic nevi of trunk: Secondary | ICD-10-CM | POA: Diagnosis not present

## 2019-08-22 DIAGNOSIS — D2371 Other benign neoplasm of skin of right lower limb, including hip: Secondary | ICD-10-CM | POA: Diagnosis not present

## 2019-08-22 DIAGNOSIS — L918 Other hypertrophic disorders of the skin: Secondary | ICD-10-CM | POA: Diagnosis not present

## 2019-08-22 DIAGNOSIS — D224 Melanocytic nevi of scalp and neck: Secondary | ICD-10-CM | POA: Diagnosis not present

## 2019-08-22 DIAGNOSIS — L738 Other specified follicular disorders: Secondary | ICD-10-CM | POA: Diagnosis not present

## 2019-09-09 ENCOUNTER — Other Ambulatory Visit: Payer: Self-pay

## 2019-09-09 ENCOUNTER — Ambulatory Visit: Payer: Medicare HMO | Attending: Internal Medicine

## 2019-09-09 DIAGNOSIS — Z23 Encounter for immunization: Secondary | ICD-10-CM

## 2019-09-09 NOTE — Progress Notes (Signed)
   Covid-19 Vaccination Clinic  Name:  Brian Todd    MRN: [id number] DOB: April 25, 1946  09/09/2019  [name] was observed post Covid-19 immunization for 15 minutes without incidence. He was provided with Vaccine Information Sheet and instruction to access the V-Safe system.   [name] was instructed to call 911 with any severe reactions post vaccine: Marland Kitchen Difficulty breathing  . Swelling of your face and throat  . A fast heartbeat  . A bad rash all over your body  . Dizziness and weakness    Immunizations Administered    Name Date Dose VIS Date Route   Pfizer COVID-19 Vaccine 09/09/2019  8:59 AM 0.3 mL 07/05/2019 Intramuscular   Manufacturer: [place]   Lot: [id number]   [place]: [id number]

## 2019-09-25 ENCOUNTER — Other Ambulatory Visit: Payer: Self-pay | Admitting: Family Medicine

## 2019-09-30 DIAGNOSIS — R69 Illness, unspecified: Secondary | ICD-10-CM | POA: Diagnosis not present

## 2019-10-05 ENCOUNTER — Other Ambulatory Visit: Payer: Self-pay | Admitting: Cardiovascular Disease

## 2019-10-07 DIAGNOSIS — R69 Illness, unspecified: Secondary | ICD-10-CM | POA: Diagnosis not present

## 2019-10-09 ENCOUNTER — Ambulatory Visit: Payer: Medicare HMO | Attending: Internal Medicine

## 2019-10-09 DIAGNOSIS — Z23 Encounter for immunization: Secondary | ICD-10-CM

## 2019-10-09 NOTE — Progress Notes (Signed)
   Covid-19 Vaccination Clinic  Name:  Brian Todd    MRN: [id number] DOB: [birth year]  10/09/2019  [name] was observed post Covid-19 immunization for 15 minutes without incident. He was provided with Vaccine Information Sheet and instruction to access the V-Safe system.   [name] was instructed to call 911 with any severe reactions post vaccine: Marland Kitchen Difficulty breathing  . Swelling of face and throat  . A fast heartbeat  . A bad rash all over body  . Dizziness and weakness   Immunizations Administered    Name Date Dose VIS Date Route   Pfizer COVID-19 Vaccine 10/09/2019  8:26 AM 0.3 mL 07/05/2019 Intramuscular   Manufacturer: [place]   Lot: [id number]   [place]: [id number]

## 2019-10-31 DIAGNOSIS — H903 Sensorineural hearing loss, bilateral: Secondary | ICD-10-CM | POA: Diagnosis not present

## 2019-10-31 DIAGNOSIS — H6123 Impacted cerumen, bilateral: Secondary | ICD-10-CM | POA: Insufficient documentation

## 2020-01-01 ENCOUNTER — Telehealth: Payer: Self-pay | Admitting: Family Medicine

## 2020-01-01 DIAGNOSIS — Z125 Encounter for screening for malignant neoplasm of prostate: Secondary | ICD-10-CM

## 2020-01-01 DIAGNOSIS — Z0000 Encounter for general adult medical examination without abnormal findings: Secondary | ICD-10-CM

## 2020-01-01 DIAGNOSIS — E7800 Pure hypercholesterolemia, unspecified: Secondary | ICD-10-CM

## 2020-01-01 DIAGNOSIS — I10 Essential (primary) hypertension: Secondary | ICD-10-CM

## 2020-01-01 NOTE — Telephone Encounter (Signed)
-----   Message from Ellamae Sia sent at 12/18/2019 12:15 PM EDT ----- Regarding: Lab orders for Thursday, 6.10.21 Patient is scheduled for CPX labs, please order future labs, Thanks , Karna Christmas

## 2020-01-02 ENCOUNTER — Ambulatory Visit (INDEPENDENT_AMBULATORY_CARE_PROVIDER_SITE_OTHER): Payer: Medicare HMO

## 2020-01-02 ENCOUNTER — Other Ambulatory Visit (INDEPENDENT_AMBULATORY_CARE_PROVIDER_SITE_OTHER): Payer: Medicare HMO

## 2020-01-02 ENCOUNTER — Other Ambulatory Visit: Payer: Medicare HMO

## 2020-01-02 DIAGNOSIS — E7800 Pure hypercholesterolemia, unspecified: Secondary | ICD-10-CM

## 2020-01-02 DIAGNOSIS — I10 Essential (primary) hypertension: Secondary | ICD-10-CM | POA: Diagnosis not present

## 2020-01-02 DIAGNOSIS — Z0000 Encounter for general adult medical examination without abnormal findings: Secondary | ICD-10-CM

## 2020-01-02 DIAGNOSIS — Z125 Encounter for screening for malignant neoplasm of prostate: Secondary | ICD-10-CM | POA: Diagnosis not present

## 2020-01-02 LAB — COMPREHENSIVE METABOLIC PANEL
ALT: 22 U/L (ref 0–53)
AST: 22 U/L (ref 0–37)
Albumin: 4.2 g/dL (ref 3.5–5.2)
Alkaline Phosphatase: 105 U/L (ref 39–117)
BUN: 24 mg/dL — ABNORMAL HIGH (ref 6–23)
CO2: 31 mEq/L (ref 19–32)
Calcium: 9.4 mg/dL (ref 8.4–10.5)
Chloride: 102 mEq/L (ref 96–112)
Creatinine, Ser: 1.01 mg/dL (ref 0.40–1.50)
GFR: 72.57 mL/min (ref >60.00)
Glucose, Bld: 77 mg/dL (ref 70–99)
Potassium: 4.7 mEq/L (ref 3.5–5.1)
Sodium: 137 mEq/L (ref 135–145)
Total Bilirubin: 1.2 mg/dL (ref 0.2–1.2)
Total Protein: 6.7 g/dL (ref 6.0–8.3)

## 2020-01-02 LAB — LIPID PANEL
Cholesterol: 107 mg/dL (ref 0–200)
HDL: 37.6 mg/dL — ABNORMAL LOW (ref >39.00)
LDL Cholesterol: 49 mg/dL (ref 0–99)
NonHDL: 69.62
Total CHOL/HDL Ratio: 3
Triglycerides: 105 mg/dL (ref 0.0–149.0)
VLDL: 21 mg/dL (ref 0.0–40.0)

## 2020-01-02 LAB — CBC WITH DIFFERENTIAL/PLATELET
Basophils Absolute: 0 10*3/uL (ref 0.0–0.1)
Basophils Relative: 0.5 % (ref 0.0–3.0)
Eosinophils Absolute: 0.1 10*3/uL (ref 0.0–0.7)
Eosinophils Relative: 1.9 % (ref 0.0–5.0)
HCT: 44 % (ref 39.0–52.0)
Hemoglobin: 14.7 g/dL (ref 13.0–17.0)
Lymphocytes Relative: 28.6 % (ref 12.0–46.0)
Lymphs Abs: 2.2 10*3/uL (ref 0.7–4.0)
MCHC: 33.4 g/dL (ref 30.0–36.0)
MCV: 93.1 fl (ref 78.0–100.0)
Monocytes Absolute: 1 10*3/uL (ref 0.1–1.0)
Monocytes Relative: 12.4 % — ABNORMAL HIGH (ref 3.0–12.0)
Neutro Abs: 4.4 10*3/uL (ref 1.4–7.7)
Neutrophils Relative %: 56.6 % (ref 43.0–77.0)
Platelets: 226 10*3/uL (ref 150.0–400.0)
RBC: 4.73 Mil/uL (ref 4.22–5.81)
RDW: 12.9 % (ref 11.5–15.5)
WBC: 7.8 10*3/uL (ref 4.0–10.5)

## 2020-01-02 LAB — PSA, MEDICARE: PSA: 2.55 ng/ml (ref 0.10–4.00)

## 2020-01-02 LAB — TSH: TSH: 2.96 u[IU]/mL (ref 0.35–4.50)

## 2020-01-02 NOTE — Progress Notes (Signed)
PCP notes:  Health Maintenance: No gaps noted   Abnormal Screenings: none   Patient concerns: Right shoulder pain onset 4-6 months ago   Nurse concerns: none   Next PCP appt.: 01/07/2020 @ 10:15 am

## 2020-01-02 NOTE — Patient Instructions (Signed)
Brian Todd , Thank you for taking time to come for your Medicare Wellness Visit. I appreciate your ongoing commitment to your health goals. Please review the following plan we discussed and let me know if I can assist you in the future.   Screening recommendations/referrals: Colonoscopy: Up to date, completed 11/29/2018 Recommended yearly ophthalmology/optometry visit for glaucoma screening and checkup Recommended yearly dental visit for hygiene and checkup  Vaccinations: Influenza vaccine: Up to date, completed 03/28/2019 Pneumococcal vaccine: Completed series Tdap vaccine: Up to date, completed 09/02/2010 Shingles vaccine: discussed    Advanced directives: Advance directive discussed with you today. Even though you declined this today please call our office should you change your mind and we can give you the proper paperwork for you to fill out.  Conditions/risks identified: hypertension, hyperlipidemia  Next appointment: 01/07/2020 @ 10:15 am   Preventive Care 65 Years and Older, Male Preventive care refers to lifestyle choices and visits with your health care provider that can promote health and wellness. What does preventive care include?  A yearly physical exam. This is also called an annual well check.  Dental exams once or twice a year.  Routine eye exams. Ask your health care provider how often you should have your eyes checked.  Personal lifestyle choices, including:  Daily care of your teeth and gums.  Regular physical activity.  Eating a healthy diet.  Avoiding tobacco and drug use.  Limiting alcohol use.  Practicing safe sex.  Taking low doses of aspirin every day.  Taking vitamin and mineral supplements as recommended by your health care provider. What happens during an annual well check? The services and screenings done by your health care provider during your annual well check will depend on your age, overall health, lifestyle risk factors, and family history  of disease. Counseling  Your health care provider may ask you questions about your:  Alcohol use.  Tobacco use.  Drug use.  Emotional well-being.  Home and relationship well-being.  Sexual activity.  Eating habits.  History of falls.  Memory and ability to understand (cognition).  Work and work Statistician. Screening  You may have the following tests or measurements:  Height, weight, and BMI.  Blood pressure.  Lipid and cholesterol levels. These may be checked every 5 years, or more frequently if you are over 81 years old.  Skin check.  Lung cancer screening. You may have this screening every year starting at age 53 if you have a 30-pack-year history of smoking and currently smoke or have quit within the past 15 years.  Fecal occult blood test (FOBT) of the stool. You may have this test every year starting at age 30.  Flexible sigmoidoscopy or colonoscopy. You may have a sigmoidoscopy every 5 years or a colonoscopy every 10 years starting at age 30.  Prostate cancer screening. Recommendations will vary depending on your family history and other risks.  Hepatitis C blood test.  Hepatitis B blood test.  Sexually transmitted disease (STD) testing.  Diabetes screening. This is done by checking your blood sugar (glucose) after you have not eaten for a while (fasting). You may have this done every 1-3 years.  Abdominal aortic aneurysm (AAA) screening. You may need this if you are a current or former smoker.  Osteoporosis. You may be screened starting at age 108 if you are at high risk. Talk with your health care provider about your test results, treatment options, and if necessary, the need for more tests. Vaccines  Your health care  provider may recommend certain vaccines, such as:  Influenza vaccine. This is recommended every year.  Tetanus, diphtheria, and acellular pertussis (Tdap, Td) vaccine. You may need a Td booster every 10 years.  Zoster vaccine. You may  need this after age 60.  Pneumococcal 13-valent conjugate (PCV13) vaccine. One dose is recommended after age 1.  Pneumococcal polysaccharide (PPSV23) vaccine. One dose is recommended after age 37. Talk to your health care provider about which screenings and vaccines you need and how often you need them. This information is not intended to replace advice given to you by your health care provider. Make sure you discuss any questions you have with your health care provider. Document Released: 08/07/2015 Document Revised: 03/30/2016 Document Reviewed: 05/12/2015 Elsevier Interactive Patient Education  2017 [place] Prevention in the Home Falls can cause injuries. They can happen to people of all ages. There are many things you can do to make your home safe and to help prevent falls. What can I do on the outside of my home?  Regularly fix the edges of walkways and driveways and fix any cracks.  Remove anything that might make you trip as you walk through a door, such as a raised step or threshold.  Trim any bushes or trees on the path to your home.  Use bright outdoor lighting.  Clear any walking paths of anything that might make someone trip, such as rocks or tools.  Regularly check to see if handrails are loose or broken. Make sure that both sides of any steps have handrails.  Any raised decks and porches should have guardrails on the edges.  Have any leaves, snow, or ice cleared regularly.  Use sand or salt on walking paths during winter.  Clean up any spills in your garage right away. This includes oil or grease spills. What can I do in the bathroom?  Use night lights.  Install grab bars by the toilet and in the tub and shower. Do not use towel bars as grab bars.  Use non-skid mats or decals in the tub or shower.  If you need to sit down in the shower, use a plastic, non-slip stool.  Keep the floor dry. Clean up any water that spills on the floor as soon as it  happens.  Remove soap buildup in the tub or shower regularly.  Attach bath mats securely with double-sided non-slip rug tape.  Do not have throw rugs and other things on the floor that can make you trip. What can I do in the bedroom?  Use night lights.  Make sure that you have a light by your bed that is easy to reach.  Do not use any sheets or blankets that are too big for your bed. They should not hang down onto the floor.  Have a firm chair that has side arms. You can use this for support while you get dressed.  Do not have throw rugs and other things on the floor that can make you trip. What can I do in the kitchen?  Clean up any spills right away.  Avoid walking on wet floors.  Keep items that you use a lot in easy-to-reach places.  If you need to reach something above you, use a strong step stool that has a grab bar.  Keep electrical cords out of the way.  Do not use floor polish or wax that makes floors slippery. If you must use wax, use non-skid floor wax.  Do not have throw  rugs and other things on the floor that can make you trip. What can I do with my stairs?  Do not leave any items on the stairs.  Make sure that there are handrails on both sides of the stairs and use them. Fix handrails that are broken or loose. Make sure that handrails are as long as the stairways.  Check any carpeting to make sure that it is firmly attached to the stairs. Fix any carpet that is loose or worn.  Avoid having throw rugs at the top or bottom of the stairs. If you do have throw rugs, attach them to the floor with carpet tape.  Make sure that you have a light switch at the top of the stairs and the bottom of the stairs. If you do not have them, ask someone to add them for you. What else can I do to help prevent falls?  Wear shoes that:  Do not have high heels.  Have rubber bottoms.  Are comfortable and fit you well.  Are closed at the toe. Do not wear sandals.  If you  use a stepladder:  Make sure that it is fully opened. Do not climb a closed stepladder.  Make sure that both sides of the stepladder are locked into place.  Ask someone to hold it for you, if possible.  Clearly mark and make sure that you can see:  Any grab bars or handrails.  First and last steps.  Where the edge of each step is.  Use tools that help you move around (mobility aids) if they are needed. These include:  Canes.  Walkers.  Scooters.  Crutches.  Turn on the lights when you go into a dark area. Replace any light bulbs as soon as they burn out.  Set up your furniture so you have a clear path. Avoid moving your furniture around.  If any of your floors are uneven, fix them.  If there are any pets around you, be aware of where they are.  Review your medicines with your doctor. Some medicines can make you feel dizzy. This can increase your chance of falling. Ask your doctor what other things that you can do to help prevent falls. This information is not intended to replace advice given to you by your health care provider. Make sure you discuss any questions you have with your health care provider. Document Released: 05/07/2009 Document Revised: 12/17/2015 Document Reviewed: 08/15/2014 Elsevier Interactive Patient Education  2017 [employer].

## 2020-01-02 NOTE — Progress Notes (Signed)
Subjective:   Brian Todd is a 74 y.o. male who presents for Medicare Annual/Subsequent preventive examination.  Review of Systems: N/A   I connected with the patient today by telephone and verified that I am speaking with the correct person using two identifiers. Location patient: home Location nurse: work Persons participating in the virtual visit: patient, Marine scientist.   I discussed the limitations, risks, security and privacy concerns of performing an evaluation and management service by telephone and the availability of in person appointments. I also discussed with the patient that there may be a patient responsible charge related to this service. The patient expressed understanding and verbally consented to this telephonic visit.    Interactive audio and video telecommunications were attempted between this nurse and patient, however failed, due to patient having technical difficulties OR patient did not have access to video capability.  We continued and completed visit with audio only.     Cardiac Risk Factors include: advanced age (>34men, >10 women);hypertension;dyslipidemia;male gender     Objective:    Vitals: There were no vitals taken for this visit.  There is no height or weight on file to calculate BMI.  Advanced Directives 01/02/2020 01/01/2019 09/27/2018 12/12/2017 12/01/2016 12/01/2015 04/04/2015  Does Patient Have a Medical Advance Directive? No No No No No No No  Would patient like information on creating a medical advance directive? No - Patient declined No - Patient declined No - Patient declined Yes (MAU/Ambulatory/Procedural Areas - Information given) - Yes - Educational materials given No - patient declined information    Tobacco Social History   Tobacco Use  Smoking Status Never Smoker  Smokeless Tobacco Never Used     Counseling given: Not Answered   Clinical Intake:  Pre-visit preparation completed: Yes  Pain : No/denies pain     Nutritional Risks:  None Diabetes: No  How often do you need to have someone help you when you read instructions, pamphlets, or other written materials from your doctor or pharmacy?: 1 - Never What is the last grade level you completed in school?: 1 year of college  Interpreter Needed?: No  Information entered by :: CJohnson, LPN  Past Medical History:  Diagnosis Date  . Colitis   . Coronary artery disease   . DJD (degenerative joint disease)   . Gallbladder disease   . GERD (gastroesophageal reflux disease)   . Hearing loss   . Heart murmur   . Hyperlipidemia   . Hypertension   . IBS (irritable bowel syndrome)   . Increased prostate specific antigen (PSA) velocity    Past Surgical History:  Procedure Laterality Date  . CARDIAC CATHETERIZATION    . CARDIAC CATHETERIZATION N/A 01/07/2015   Procedure: Left Heart Cath and Coronary Angiography;  Surgeon: [name], MD;  Location: [facility];  Service: Cardiovascular;  Laterality: N/A;  . CARDIAC CATHETERIZATION N/A 01/07/2015   Procedure: Coronary Stent Intervention;  Surgeon: [name], MD;  Location: [place] [facility];  Service: Cardiovascular;  Laterality: N/A;  . COLONOSCOPY    . CORONARY STENT PLACEMENT     Dr. [name]  . CORONARY STENT PLACEMENT  01/07/2015   LAD  . TONSILLECTOMY AND ADENOIDECTOMY     in the 2nd grade   Family History  Problem Relation Age of Onset  . Stroke Mother   . Colon cancer Neg Hx   . Esophageal cancer Neg Hx   . Rectal cancer Neg Hx   . Stomach cancer Neg Hx  Social History   Socioeconomic History  . Marital status: Married    Spouse name: linda wrenn Wetmore  . Number of children: 2  . Years of education: Not on file  . Highest education level: Not on file  Occupational History  . Occupation: Engineer, production    Comment: Retired  Tobacco Use  . Smoking status: Never Smoker  . Smokeless tobacco: Never Used  Vaping Use  . Vaping Use: Never used  Substance and Sexual Activity  .  Alcohol use: Yes    Alcohol/week: 0.0 standard drinks    Comment: rarely drinks beer/approx. 1 case per year  . Drug use: No  . Sexual activity: Yes  Other Topics Concern  . Not on file  Social History Narrative  . Not on file   Social Determinants of Health   Financial Resource Strain: Low Risk   . Difficulty of Paying Living Expenses: Not hard at all  Food Insecurity: No Food Insecurity  . Worried About Charity fundraiser in the Last Year: Never true  . Ran Out of Food in the Last Year: Never true  Transportation Needs: No Transportation Needs  . Lack of Transportation (Medical): No  . Lack of Transportation (Non-Medical): No  Physical Activity: Sufficiently Active  . Days of Exercise per Week: 3 days  . Minutes of Exercise per Session: 60 min  Stress: No Stress Concern Present  . Feeling of Stress : Not at all  Social Connections:   . Frequency of Communication with Friends and Family:   . Frequency of Social Gatherings with Friends and Family:   . Attends Religious Services:   . Active Member of Clubs or Organizations:   . Attends Archivist Meetings:   Marland Kitchen Marital Status:     Outpatient Encounter Medications as of 01/02/2020  Medication Sig  . aspirin EC 81 MG tablet Take 1 tablet (81 mg total) by mouth daily.  Marland Kitchen atorvastatin (LIPITOR) 40 MG tablet TAKE 1 TABLET BY MOUTH EVERY DAY  . clopidogrel (PLAVIX) 75 MG tablet TAKE 1 TABLET EVERY DAY  . metoprolol tartrate (LOPRESSOR) 25 MG tablet TAKE 1 TABLET BY MOUTH TWICE A DAY  . Multiple Vitamins-Minerals (MULTIVITAMIN & MINERAL PO) Take 1 tablet by mouth daily.  . nitroGLYCERIN (NITROSTAT) 0.4 MG SL tablet Place 1 tablet (0.4 mg total) under the tongue every 5 (five) minutes as needed for chest pain.  . pantoprazole (PROTONIX) 40 MG tablet TAKE 1 TABLET BY MOUTH EVERY DAY  . polyethylene glycol (MIRALAX) packet Take 17 g by mouth daily.  . ramipril (ALTACE) 10 MG capsule TAKE 1 CAPSULE BY MOUTH EVERY DAY   No  facility-administered encounter medications on file as of 01/02/2020.    Activities of Daily Living In your present state of health, do you have any difficulty performing the following activities: 01/02/2020  Hearing? N  Vision? N  Difficulty concentrating or making decisions? N  Walking or climbing stairs? N  Dressing or bathing? N  Doing errands, shopping? N  Preparing Food and eating ? N  Using the Toilet? N  In the past six months, have you accidently leaked urine? N  Do you have problems with loss of bowel control? N  Managing your Medications? N  Managing your Finances? N  Housekeeping or managing your Housekeeping? N  Some recent data might be hidden    Patient Care Team: Tower, Wynelle Fanny, MD as PCP - General (Family Medicine) [name], MD as PCP - Cardiology (Cardiology)  Ralene Bathe, MD as Consulting Physician (Ophthalmology) [name], MD as Consulting Physician (Cardiology)   Assessment:   This is a routine wellness examination for Talib.  Exercise Activities and Dietary recommendations Current Exercise Habits: Home exercise routine, Type of exercise: Other - see comments (golf), Time (Minutes): > 60, Frequency (Times/Week): 3, Weekly Exercise (Minutes/Week): 0, Intensity: Moderate, Exercise limited by: None identified  Goals    . Patient Stated     Starting 01/01/2019, I will continue to remain active by doing yard work as needed.     . Patient Stated     01/02/2020, I will continue to play golf 3 days a week for about 1-2 hours.        Fall Risk Fall Risk  01/02/2020 01/01/2019 12/12/2017 12/01/2016 12/01/2015  Falls in the past year? 0 0 No No No  Number falls in past yr: 0 - - - -  Injury with Fall? 0 - - - -  Risk for fall due to : Medication side effect - - - -  Follow up Falls evaluation completed;Falls prevention discussed - - - -   Is the patient's home free of loose throw rugs in walkways, pet beds, electrical cords, etc?   yes       Grab bars in the bathroom? no      Handrails on the stairs?   yes      Adequate lighting?   yes  Timed Get Up and Go Performed: N/A  Depression Screen PHQ 2/9 Scores 01/02/2020 01/01/2019 12/12/2017 12/01/2016  PHQ - 2 Score 0 0 0 0  PHQ- 9 Score 0 0 0 -    Cognitive Function MMSE - Mini Mental State Exam 01/02/2020 01/01/2019 12/12/2017 12/01/2016 12/01/2015  Orientation to time 5 5 5 5 5   Orientation to Place 5 5 5 5 5   Registration 3 3 3 3 3   Attention/ Calculation 5 0 0 0 0  Recall 3 3 2 3 3   Recall-comments - - unable to recall 1 of 3 words - -  Language- name 2 objects - 0 0 0 0  Language- repeat 1 1 1 1 1   Language- follow 3 step command - 0 3 3 3   Language- read & follow direction - 0 0 0 0  Write a sentence - 0 0 0 0  Copy design - 0 0 0 0  Total score - 17 19 20 20   Mini Cog  Mini-Cog screen was completed. Maximum score is 22. A value of 0 denotes this part of the MMSE was not completed or the patient failed this part of the Mini-Cog screening.       Immunization History  Administered Date(s) Administered  . Fluad Quad(high Dose 65+) 03/28/2019  . Influenza Split 04/16/2011, 04/26/2012  . Influenza Whole 03/25/2009, 03/25/2010  . Influenza, High Dose Seasonal PF 05/13/2017, 04/11/2018  . Influenza, Seasonal, Injecte, Preservative Fre 04/28/2016  . Influenza,inj,Quad PF,6+ Mos 04/10/2013, 04/02/2014, 05/04/2015  . Influenza-Unspecified 05/13/2017  . PFIZER SARS-COV-2 Vaccination 09/09/2019, 10/09/2019  . Pneumococcal Conjugate-13 10/31/2014  . Pneumococcal Polysaccharide-23 09/16/2013  . Td 09/02/2010    Qualifies for Shingles Vaccine: yes  Sycreening Tests Health Maintenance  Topic Date Due  . INFLUENZA VACCINE  02/23/2020  . TETANUS/TDAP  09/02/2020  . COVID-19 Vaccine  Completed  . Hepatitis C Screening  Completed  . PNA vac Low Risk Adult  Completed   Cancer Screenings: Lung: Low Dose CT Chest recommended if Age 60-80 years, 30 pack-year currently smoking  OR have quit w/in 15 years. Patient does not qualify. Colorectal: completed 11/29/2018  Additional Screenings:  Hepatitis C Screening: 12/01/2015      Plan:    Patient will continue to play golf 3 days a week for about 1-2 hours.   I have personally reviewed and noted the following in the patient's chart:   . Medical and social history . Use of alcohol, tobacco or illicit drugs  . Current medications and supplements . Functional ability and status . Nutritional status . Physical activity . Advanced directives . List of other physicians . Hospitalizations, surgeries, and ER visits in previous 12 months . Vitals . Screenings to include cognitive, depression, and falls . Referrals and appointments  In addition, I have reviewed and discussed with patient certain preventive protocols, quality metrics, and best practice recommendations. A written personalized care plan for preventive services as well as general preventive health recommendations were provided to patient.     Andrez Grime, LPN  [id number]

## 2020-01-07 ENCOUNTER — Encounter: Payer: Self-pay | Admitting: Family Medicine

## 2020-01-07 ENCOUNTER — Other Ambulatory Visit: Payer: Self-pay

## 2020-01-07 ENCOUNTER — Ambulatory Visit (INDEPENDENT_AMBULATORY_CARE_PROVIDER_SITE_OTHER): Payer: Medicare HMO | Admitting: Family Medicine

## 2020-01-07 VITALS — BP 126/64 | HR 47 | Temp 97.3°F | Ht 67.5 in | Wt 186.3 lb

## 2020-01-07 DIAGNOSIS — E7800 Pure hypercholesterolemia, unspecified: Secondary | ICD-10-CM

## 2020-01-07 DIAGNOSIS — I700 Atherosclerosis of aorta: Secondary | ICD-10-CM | POA: Diagnosis not present

## 2020-01-07 DIAGNOSIS — Z0000 Encounter for general adult medical examination without abnormal findings: Secondary | ICD-10-CM | POA: Diagnosis not present

## 2020-01-07 DIAGNOSIS — I10 Essential (primary) hypertension: Secondary | ICD-10-CM

## 2020-01-07 DIAGNOSIS — I2510 Atherosclerotic heart disease of native coronary artery without angina pectoris: Secondary | ICD-10-CM | POA: Diagnosis not present

## 2020-01-07 DIAGNOSIS — Z125 Encounter for screening for malignant neoplasm of prostate: Secondary | ICD-10-CM | POA: Diagnosis not present

## 2020-01-07 MED ORDER — PANTOPRAZOLE SODIUM 40 MG PO TBEC: 40.0000 mg | DELAYED_RELEASE_TABLET | Freq: Every day | ORAL | 3 refills | Status: DC

## 2020-01-07 MED ORDER — RAMIPRIL 10 MG PO CAPS: 10.0000 mg | ORAL_CAPSULE | Freq: Every day | ORAL | 3 refills | Status: DC

## 2020-01-07 MED ORDER — ATORVASTATIN CALCIUM 40 MG PO TABS: 40.0000 mg | ORAL_TABLET | Freq: Every day | ORAL | 3 refills | Status: DC

## 2020-01-07 NOTE — Assessment & Plan Note (Signed)
No clinical changes  Good bp and cholesterol control  

## 2020-01-07 NOTE — Assessment & Plan Note (Signed)
Reviewed health habits including diet and exercise and skin cancer prevention Reviewed appropriate screening tests for age  Also reviewed health mt list, fam hx and immunization status , as well as social and family history   See HPI amw reviewed  utd derm care /disc sun protection Enc more exercise  psa fairly stable and no urinary symptoms Vaccinated for covid 19  Considering shingrix-discussed

## 2020-01-07 NOTE — Assessment & Plan Note (Signed)
bp in fair control at this time  BP Readings from Last 1 Encounters:  01/07/20 126/64   No changes needed Most recent labs reviewed  Disc lifstyle change with low sodium diet and exercise

## 2020-01-07 NOTE — Assessment & Plan Note (Signed)
Disc goals for lipids and reasons to control them Rev last labs with pt Rev low sat fat diet in detail  Well controlled with atorvastatin and diet  HDL is low-enc more exercise and omega 3 intake

## 2020-01-07 NOTE — Patient Instructions (Addendum)
If you are interested in the new shingles vaccine (Shingrix) - call your local pharmacy to check on coverage and availability  If affordable, get on a wait list at your pharmacy to get the vaccine.  Work on an exercise plan -this will help raise your good cholesterol (HDL)   Please use sun protection when you are outdoors

## 2020-01-07 NOTE — Progress Notes (Signed)
Subjective:    Patient ID: Brian Todd, male    DOB: [birth year], 74 y.o.   MRN: [id number]  This visit occurred during the SARS-CoV-2 public health emergency.  Safety protocols were in place, including screening questions prior to the visit, additional usage of staff PPE, and extensive cleaning of exam room while observing appropriate contact time as indicated for disinfecting solutions.    HPI Here for health maintenance exam and to review chronic medical problems    Wt Readings from Last 3 Encounters:  01/07/20 186 lb 5 oz (84.5 kg)  02/19/19 175 lb 7 oz (79.6 kg)  01/04/19 183 lb (83 kg)   28.75 kg/m  Plays golf  No gym due to covid ([facility])  Diet has been ok     Had amw on 6/10  No gaps or concerns  covid status -vaccinated  Never had it that he knows of  Zoster status -has not had shingrix   Prostate health  Lab Results  Component Value Date   PSA 2.55 01/02/2020   PSA 2.37 01/01/2019   PSA 2.25 12/12/2017   No voiding symptoms  No fam hx  Nocturia - seldom if at all     HTN-in setting of CAD and aortic atherosclerosis - all has been stable  bp is stable today  No cp or palpitations or headaches or edema  No side effects to medicines  BP Readings from Last 3 Encounters:  01/07/20 126/64  02/19/19 116/62  01/04/19 120/61     Pulse Readings from Last 3 Encounters:  01/07/20 (!) 47  02/19/19 (!) 50  01/04/19 (!) 51  not feeling dizzy- takes metoprolol from cardiology (did have to cut dose several years ago)    Lab Results  Component Value Date   CREATININE 1.01 01/02/2020   BUN 24 (H) 01/02/2020   NA 137 01/02/2020   K 4.7 01/02/2020   CL 102 01/02/2020   CO2 31 01/02/2020   gfr is 72.5  Lab Results  Component Value Date   ALT 22 01/02/2020   AST 22 01/02/2020   ALKPHOS 105 01/02/2020   BILITOT 1.2 01/02/2020    Hyperlipidemia Lab Results  Component Value Date   CHOL 107 01/02/2020   CHOL 120 01/01/2019   CHOL 126  12/12/2017   Lab Results  Component Value Date   HDL 37.60 (L) 01/02/2020   HDL 43.10 01/01/2019   HDL 36.70 (L) 12/12/2017   Lab Results  Component Value Date   LDLCALC 49 01/02/2020   LDLCALC 58 01/01/2019   LDLCALC 50 12/12/2017   Lab Results  Component Value Date   TRIG 105.0 01/02/2020   TRIG 93.0 01/01/2019   TRIG 197.0 (H) 12/12/2017   Lab Results  Component Value Date   CHOLHDL 3 01/02/2020   CHOLHDL 3 01/01/2019   CHOLHDL 3 12/12/2017   Lab Results  Component Value Date   LDLDIRECT 76.5 09/02/2010   Taking atorvastatin and diet  LDL good at 49  HDL is down below 40 - poss due to less exercise   Lab Results  Component Value Date   WBC 7.8 01/02/2020   HGB 14.7 01/02/2020   HCT 44.0 01/02/2020   MCV 93.1 01/02/2020   PLT 226.0 01/02/2020   Lab Results  Component Value Date   TSH 2.96 01/02/2020     Saw derm  No skin cancers Wears spf shirts  Patient Active Problem List   Diagnosis Date Noted  . Aortic atherosclerosis ([place]) 09/30/2018  .  Prostate cancer screening 11/26/2016  . Routine general medical examination at a health care facility 11/17/2015  . Coronary artery disease involving native coronary artery without angina pectoris 04/09/2015  . History of heart artery stent 01/01/2015  . History of colitis 11/08/2014  . Encounter for Medicare annual wellness exam 10/31/2014  . DEGENERATIVE JOINT DISEASE 07/30/2009  . HEARING LOSS, MILD 11/27/2007  . IRRITABLE BOWEL SYNDROME 11/27/2007  . Hyperlipidemia 08/13/2007  . Essential hypertension 08/13/2007  . GERD 08/13/2007   Past Medical History:  Diagnosis Date  . Colitis   . Coronary artery disease   . DJD (degenerative joint disease)   . Gallbladder disease   . GERD (gastroesophageal reflux disease)   . Hearing loss   . Heart murmur   . Hyperlipidemia   . Hypertension   . IBS (irritable bowel syndrome)   . Increased prostate specific antigen (PSA) velocity    Past Surgical History:    Procedure Laterality Date  . CARDIAC CATHETERIZATION    . CARDIAC CATHETERIZATION N/A 01/07/2015   Procedure: Left Heart Cath and Coronary Angiography;  Surgeon: [name], MD;  Location: [facility];  Service: Cardiovascular;  Laterality: N/A;  . CARDIAC CATHETERIZATION N/A 01/07/2015   Procedure: Coronary Stent Intervention;  Surgeon: [name], MD;  Location: [facility];  Service: Cardiovascular;  Laterality: N/A;  . COLONOSCOPY    . CORONARY STENT PLACEMENT     Dr. [name]  . CORONARY STENT PLACEMENT  01/07/2015   LAD  . TONSILLECTOMY AND ADENOIDECTOMY     in the 2nd grade   Social History   Tobacco Use  . Smoking status: Never Smoker  . Smokeless tobacco: Never Used  Vaping Use  . Vaping Use: Never used  Substance Use Topics  . Alcohol use: Yes    Alcohol/week: 0.0 standard drinks    Comment: rarely drinks beer/approx. 1 case per year  . Drug use: No   Family History  Problem Relation Age of Onset  . Stroke Mother   . Colon cancer Neg Hx   . Esophageal cancer Neg Hx   . Rectal cancer Neg Hx   . Stomach cancer Neg Hx    Allergies  Allergen Reactions  . Doxycycline     Headaches   . Flagyl [Metronidazole]    Current Outpatient Medications on File Prior to Visit  Medication Sig Dispense Refill  . aspirin EC 81 MG tablet Take 1 tablet (81 mg total) by mouth daily.    . clopidogrel (PLAVIX) 75 MG tablet TAKE 1 TABLET EVERY DAY 90 tablet 3  . metoprolol tartrate (LOPRESSOR) 25 MG tablet TAKE 1 TABLET BY MOUTH TWICE A DAY 180 tablet 0  . Multiple Vitamins-Minerals (MULTIVITAMIN & MINERAL PO) Take 1 tablet by mouth daily.    . nitroGLYCERIN (NITROSTAT) 0.4 MG SL tablet Place 1 tablet (0.4 mg total) under the tongue every 5 (five) minutes as needed for chest pain. 25 tablet 0  . polyethylene glycol (MIRALAX) packet Take 17 g by mouth daily. 14 each 0   No current facility-administered medications on file prior to visit.     Review of  Systems  Constitutional: Negative for activity change, appetite change, fatigue, fever and unexpected weight change.  HENT: Negative for congestion, rhinorrhea, sore throat and trouble swallowing.   Eyes: Negative for pain, redness, itching and visual disturbance.  Respiratory: Negative for cough, chest tightness, shortness of breath and wheezing.   Cardiovascular: Negative for chest pain and palpitations.  Gastrointestinal: Negative for abdominal pain, blood in stool, constipation, diarrhea and nausea.  Endocrine: Negative for cold intolerance, heat intolerance, polydipsia and polyuria.  Genitourinary: Negative for difficulty urinating, dysuria, frequency and urgency.  Musculoskeletal: Negative for arthralgias, joint swelling and myalgias.  Skin: Negative for pallor and rash.  Neurological: Negative for dizziness, tremors, weakness, numbness and headaches.  Hematological: Negative for adenopathy. Does not bruise/bleed easily.  Psychiatric/Behavioral: Negative for decreased concentration and dysphoric mood. The patient is not nervous/anxious.        Objective:   Physical Exam Constitutional:      General: He is not in acute distress.    Appearance: Normal appearance. He is well-developed and normal weight. He is not ill-appearing or diaphoretic.  HENT:     Head: Normocephalic and atraumatic.     Right Ear: Tympanic membrane, ear canal and external ear normal.     Left Ear: Tympanic membrane, ear canal and external ear normal.     Nose: Nose normal. No congestion.     Mouth/Throat:     Mouth: Mucous membranes are moist.     Pharynx: Oropharynx is clear. No posterior oropharyngeal erythema.  Eyes:     General: No scleral icterus.       Right eye: No discharge.        Left eye: No discharge.     Conjunctiva/sclera: Conjunctivae normal.     Pupils: Pupils are equal, round, and reactive to light.  Neck:     Thyroid: No thyromegaly.     Vascular: No carotid bruit or JVD.   Cardiovascular:     Rate and Rhythm: Normal rate and regular rhythm.     Pulses: Normal pulses.     Heart sounds: Normal heart sounds. No gallop.   Pulmonary:     Effort: Pulmonary effort is normal. No respiratory distress.     Breath sounds: Normal breath sounds. No wheezing or rales.     Comments: Good air exch Chest:     Chest wall: No tenderness.  Abdominal:     General: Bowel sounds are normal. There is no distension or abdominal bruit.     Palpations: Abdomen is soft. There is no mass.     Tenderness: There is no abdominal tenderness.     Hernia: No hernia is present.  Musculoskeletal:        General: No tenderness.     Cervical back: Normal range of motion and neck supple. No rigidity. No muscular tenderness.     Right lower leg: No edema.     Left lower leg: No edema.     Comments: No kyphosis or acute joint changes   Lymphadenopathy:     Cervical: No cervical adenopathy.  Skin:    General: Skin is warm and dry.     Coloration: Skin is not pale.     Findings: No erythema or rash.     Comments: Tanned Solar lentigines diffusely   Neurological:     Mental Status: He is alert.     Cranial Nerves: No cranial nerve deficit.     Motor: No abnormal muscle tone.     Coordination: Coordination normal.     Gait: Gait normal.     Deep Tendon Reflexes: Reflexes are normal and symmetric. Reflexes normal.  Psychiatric:        Mood and Affect: Mood normal.        Cognition and Memory: Cognition and memory normal.     Comments: pleasant  Assessment & Plan:   Problem List Items Addressed This Visit      Cardiovascular and Mediastinum   Essential hypertension    bp in fair control at this time  BP Readings from Last 1 Encounters:  01/07/20 126/64   No changes needed Most recent labs reviewed  Disc lifstyle change with low sodium diet and exercise        Relevant Medications   atorvastatin (LIPITOR) 40 MG tablet   ramipril (ALTACE) 10 MG capsule    Coronary artery disease involving native coronary artery without angina pectoris    No clinical changes Good bp and cholesterol control        Relevant Medications   atorvastatin (LIPITOR) 40 MG tablet   ramipril (ALTACE) 10 MG capsule   Aortic atherosclerosis ([facility])    No clinical changes  bp and cholesterol control       Relevant Medications   atorvastatin (LIPITOR) 40 MG tablet   ramipril (ALTACE) 10 MG capsule     Other   Hyperlipidemia    Disc goals for lipids and reasons to control them Rev last labs with pt Rev low sat fat diet in detail  Well controlled with atorvastatin and diet  HDL is low-enc more exercise and omega 3 intake      Relevant Medications   atorvastatin (LIPITOR) 40 MG tablet   ramipril (ALTACE) 10 MG capsule   Routine general medical examination at a health care facility - Primary    Reviewed health habits including diet and exercise and skin cancer prevention Reviewed appropriate screening tests for age  Also reviewed health mt list, fam hx and immunization status , as well as social and family history   See HPI amw reviewed  utd derm care /disc sun protection Enc more exercise  psa fairly stable and no urinary symptoms Vaccinated for covid 19  Considering shingrix-discussed        Prostate cancer screening    Lab Results  Component Value Date   PSA 2.55 01/02/2020   PSA 2.37 01/01/2019   PSA 2.25 12/12/2017    Fairly stable No fam hx No urinary changes

## 2020-01-07 NOTE — Assessment & Plan Note (Signed)
No clinical changes  bp and cholesterol control

## 2020-01-07 NOTE — Assessment & Plan Note (Signed)
Lab Results  Component Value Date   PSA 2.55 01/02/2020   PSA 2.37 01/01/2019   PSA 2.25 12/12/2017    Fairly stable No fam hx No urinary changes

## 2020-02-27 ENCOUNTER — Ambulatory Visit: Payer: Medicare HMO | Admitting: Cardiovascular Disease

## 2020-04-03 ENCOUNTER — Other Ambulatory Visit: Payer: Self-pay | Admitting: Cardiovascular Disease

## 2020-04-06 DIAGNOSIS — R69 Illness, unspecified: Secondary | ICD-10-CM | POA: Diagnosis not present

## 2020-04-09 ENCOUNTER — Ambulatory Visit: Payer: Medicare HMO | Admitting: Cardiovascular Disease

## 2020-04-09 ENCOUNTER — Other Ambulatory Visit: Payer: Self-pay

## 2020-04-09 ENCOUNTER — Encounter: Payer: Self-pay | Admitting: Cardiovascular Disease

## 2020-04-09 VITALS — BP 140/78 | HR 49 | Ht 68.0 in | Wt 188.2 lb

## 2020-04-09 DIAGNOSIS — E7800 Pure hypercholesterolemia, unspecified: Secondary | ICD-10-CM

## 2020-04-09 DIAGNOSIS — R69 Illness, unspecified: Secondary | ICD-10-CM | POA: Diagnosis not present

## 2020-04-09 DIAGNOSIS — I10 Essential (primary) hypertension: Secondary | ICD-10-CM

## 2020-04-09 DIAGNOSIS — I2510 Atherosclerotic heart disease of native coronary artery without angina pectoris: Secondary | ICD-10-CM | POA: Diagnosis not present

## 2020-04-09 NOTE — Progress Notes (Signed)
Cardiology Office Note   Date:  04/09/2020   ID:  Brian Todd, DOB 1946/05/17, MRN [id number]  PCP:  Abner Greenspan, MD  Cardiologist:   Kathlyn Sacramento, MD   Chief Complaint  Patient presents with  . office visit    12 month F/U; Meds verbally reviewed with patient.      History of Present Illness: Brian Todd is a 74 y.o. male who presents for a follow-up visit  regarding coronary artery disease. He had a small non-ST elevation myocardial infarction in 2001. Cardiac catheterization showed a 95% mid LAD stenosis and 40% stenosis in OM branch. He had successful angioplasty and Taxus drug-eluting stent placement to the mid LAD without complications.  He had recurrent angina in June 2016. Cardiac catheterization showed 99% in-stent restenosis in the mid LAD extending to the distal edge, 60% mid left circumflex stenosis and mild RCA disease. I performed successful angioplasty and drug-eluting stent placement to the mid LAD.  He has other chronic medical conditions that include hypertension, hyperlipidemia and gastroesophageal reflux disease.  He has been doing very well with no chest pain, shortness of breath or palpitations.  No significant leg edema.  He is on long-term dual antiplatelet therapy with no bleeding issues.  He continues to be very active.    Past Medical History:  Diagnosis Date  . Colitis   . Coronary artery disease   . DJD (degenerative joint disease)   . Gallbladder disease   . GERD (gastroesophageal reflux disease)   . Hearing loss   . Heart murmur   . Hyperlipidemia   . Hypertension   . IBS (irritable bowel syndrome)   . Increased prostate specific antigen (PSA) velocity     Past Surgical History:  Procedure Laterality Date  . CARDIAC CATHETERIZATION    . CARDIAC CATHETERIZATION N/A 01/07/2015   Procedure: Left Heart Cath and Coronary Angiography;  Surgeon: [name], MD;  Location: [facility];  Service: Cardiovascular;   Laterality: N/A;  . CARDIAC CATHETERIZATION N/A 01/07/2015   Procedure: Coronary Stent Intervention;  Surgeon: [name], MD;  Location: [facility];  Service: Cardiovascular;  Laterality: N/A;  . COLONOSCOPY    . CORONARY STENT PLACEMENT     Dr. [name]  . CORONARY STENT PLACEMENT  01/07/2015   LAD  . TONSILLECTOMY AND ADENOIDECTOMY     in the 2nd grade     Current Outpatient Medications  Medication Sig Dispense Refill  . aspirin EC 81 MG tablet Take 1 tablet (81 mg total) by mouth daily.    Marland Kitchen atorvastatin (LIPITOR) 40 MG tablet Take 1 tablet (40 mg total) by mouth daily. 90 tablet 3  . clopidogrel (PLAVIX) 75 MG tablet TAKE 1 TABLET EVERY DAY 90 tablet 0  . metoprolol tartrate (LOPRESSOR) 25 MG tablet TAKE 1 TABLET BY MOUTH TWICE A DAY 180 tablet 0  . Multiple Vitamins-Minerals (MULTIVITAMIN & MINERAL PO) Take 1 tablet by mouth daily.    . nitroGLYCERIN (NITROSTAT) 0.4 MG SL tablet Place 1 tablet (0.4 mg total) under the tongue every 5 (five) minutes as needed for chest pain. 25 tablet 0  . pantoprazole (PROTONIX) 40 MG tablet Take 1 tablet (40 mg total) by mouth daily. 90 tablet 3  . polyethylene glycol (MIRALAX) packet Take 17 g by mouth daily. 14 each 0  . ramipril (ALTACE) 10 MG capsule Take 1 capsule (10 mg total) by mouth daily. 90 capsule 3   No current  facility-administered medications for this visit.    Allergies:   Doxycycline and Flagyl [metronidazole]    Social History:  The patient  reports that he has never smoked. He has never used smokeless tobacco. He reports current alcohol use. He reports that he does not use drugs.   Family History:  The patient's family history includes Stroke in his mother.    ROS:  Please see the history of present illness.   Otherwise, review of systems are positive for none.   All other systems are reviewed and negative.    PHYSICAL EXAM: VS:  BP 140/78 (BP Location: Left Arm, Patient Position: Sitting, Cuff Size: Normal)    Pulse (!) 49   Ht 5\' 8"  (1.727 m)   Wt 188 lb 4 oz (85.4 kg)   SpO2 99%   BMI 28.62 kg/m  , BMI Body mass index is 28.62 kg/m. GEN: Well nourished, well developed, in no acute distress  HEENT: normal  Neck: no JVD, carotid bruits, or masses Cardiac: RRR; no murmurs, rubs, or gallops,no edema  Respiratory:  clear to auscultation bilaterally, normal work of breathing GI: soft, nontender, nondistended, + BS MS: no deformity or atrophy  Skin: warm and dry, no rash Neuro:  Strength and sensation are intact Psych: euthymic mood, full affect   EKG:  EKG is ordered today. The ekg ordered today demonstrates sinus bradycardia with no significant ST or T wave changes.  Heart rate is 49 bpm.   Recent Labs: 01/02/2020: ALT 22; BUN 24; Creatinine, Ser 1.01; Hemoglobin 14.7; Platelets 226.0; Potassium 4.7; Sodium 137; TSH 2.96    Lipid Panel    Component Value Date/Time   CHOL 107 01/02/2020 0906   TRIG 105.0 01/02/2020 0906   HDL 37.60 (L) 01/02/2020 0906   CHOLHDL 3 01/02/2020 0906   VLDL 21.0 01/02/2020 0906   LDLCALC 49 01/02/2020 0906   LDLDIRECT 76.5 09/02/2010 1036      Wt Readings from Last 3 Encounters:  04/09/20 188 lb 4 oz (85.4 kg)  01/07/20 186 lb 5 oz (84.5 kg)  02/19/19 175 lb 7 oz (79.6 kg)        ASSESSMENT AND PLAN:  1.  Coronary artery disease involving native coronary arteries without angina: The patient overall is doing well from a cardiac standpoint. Given that he has a stent within a stent and also has a previous Taxus drug-eluting stent, I am planning to keep him on  dual antiplatelet therapy indefinitely as tolerated.  2. Hyperlipidemia: Continue atorvastatin.  I reviewed most recent lipid profile done in June which showed a triglyceride of 105, HDL of 37 and an LDL of 49.  3. Essential hypertension: Blood pressure is well controlled on metoprolol and ramipril.  He has chronic bradycardia but overall is asymptomatic.  If resting heart rate decreases  further, recommend switching metoprolol to carvedilol.   Disposition:   FU with me in 1 year  Signed,  Kathlyn Sacramento, MD  04/09/2020 9:59 AM    [place]

## 2020-04-09 NOTE — Patient Instructions (Signed)

## 2020-04-30 DIAGNOSIS — H6123 Impacted cerumen, bilateral: Secondary | ICD-10-CM | POA: Diagnosis not present

## 2020-04-30 DIAGNOSIS — H903 Sensorineural hearing loss, bilateral: Secondary | ICD-10-CM | POA: Diagnosis not present

## 2020-07-04 ENCOUNTER — Other Ambulatory Visit: Payer: Self-pay | Admitting: Cardiovascular Disease

## 2020-07-05 ENCOUNTER — Other Ambulatory Visit: Payer: Self-pay | Admitting: Cardiovascular Disease

## 2020-07-06 NOTE — Telephone Encounter (Signed)
Rx request sent to pharmacy.  

## 2020-07-07 DIAGNOSIS — H5203 Hypermetropia, bilateral: Secondary | ICD-10-CM | POA: Diagnosis not present

## 2020-07-07 DIAGNOSIS — H52203 Unspecified astigmatism, bilateral: Secondary | ICD-10-CM | POA: Diagnosis not present

## 2020-10-08 DIAGNOSIS — L918 Other hypertrophic disorders of the skin: Secondary | ICD-10-CM | POA: Diagnosis not present

## 2020-10-08 DIAGNOSIS — D225 Melanocytic nevi of trunk: Secondary | ICD-10-CM | POA: Diagnosis not present

## 2020-10-08 DIAGNOSIS — L821 Other seborrheic keratosis: Secondary | ICD-10-CM | POA: Diagnosis not present

## 2020-10-08 DIAGNOSIS — L738 Other specified follicular disorders: Secondary | ICD-10-CM | POA: Diagnosis not present

## 2020-10-08 DIAGNOSIS — L814 Other melanin hyperpigmentation: Secondary | ICD-10-CM | POA: Diagnosis not present

## 2020-10-08 DIAGNOSIS — L720 Epidermal cyst: Secondary | ICD-10-CM | POA: Diagnosis not present

## 2020-10-08 DIAGNOSIS — D1801 Hemangioma of skin and subcutaneous tissue: Secondary | ICD-10-CM | POA: Diagnosis not present

## 2020-10-18 ENCOUNTER — Other Ambulatory Visit: Payer: Self-pay | Admitting: Cardiovascular Disease

## 2020-10-19 NOTE — Telephone Encounter (Signed)
Rx request sent to pharmacy.  

## 2020-11-19 ENCOUNTER — Other Ambulatory Visit: Payer: Self-pay | Admitting: Cardiovascular Disease

## 2020-11-20 NOTE — Telephone Encounter (Signed)
Rx request sent to pharmacy.  

## 2020-12-31 ENCOUNTER — Telehealth: Payer: Self-pay | Admitting: Family Medicine

## 2020-12-31 DIAGNOSIS — Z125 Encounter for screening for malignant neoplasm of prostate: Secondary | ICD-10-CM

## 2020-12-31 DIAGNOSIS — I10 Essential (primary) hypertension: Secondary | ICD-10-CM

## 2020-12-31 DIAGNOSIS — E7800 Pure hypercholesterolemia, unspecified: Secondary | ICD-10-CM

## 2020-12-31 NOTE — Telephone Encounter (Signed)
-----   Message from Cloyd Stagers, RT sent at 12/14/2020  2:26 PM EDT ----- Regarding: Lab Orders for Friday 6.10.2022 Please place lab orders for  Friday 6.10.2022, office visit for physical on Friday 6.17.2022 Thank you, Dyke Maes RT(R)

## 2021-01-01 ENCOUNTER — Other Ambulatory Visit: Payer: Self-pay

## 2021-01-01 ENCOUNTER — Other Ambulatory Visit (INDEPENDENT_AMBULATORY_CARE_PROVIDER_SITE_OTHER): Payer: Medicare HMO

## 2021-01-01 DIAGNOSIS — Z125 Encounter for screening for malignant neoplasm of prostate: Secondary | ICD-10-CM

## 2021-01-01 DIAGNOSIS — I10 Essential (primary) hypertension: Secondary | ICD-10-CM | POA: Diagnosis not present

## 2021-01-01 DIAGNOSIS — E7800 Pure hypercholesterolemia, unspecified: Secondary | ICD-10-CM | POA: Diagnosis not present

## 2021-01-01 LAB — CBC WITH DIFFERENTIAL/PLATELET
Basophils Absolute: 0 10*3/uL (ref 0.0–0.1)
Basophils Relative: 0.4 % (ref 0.0–3.0)
Eosinophils Absolute: 0.1 10*3/uL (ref 0.0–0.7)
Eosinophils Relative: 1.6 % (ref 0.0–5.0)
HCT: 43.7 % (ref 39.0–52.0)
Hemoglobin: 14.8 g/dL (ref 13.0–17.0)
Lymphocytes Relative: 31.1 % (ref 12.0–46.0)
Lymphs Abs: 2 10*3/uL (ref 0.7–4.0)
MCHC: 33.8 g/dL (ref 30.0–36.0)
MCV: 92.3 fl (ref 78.0–100.0)
Monocytes Absolute: 0.7 10*3/uL (ref 0.1–1.0)
Monocytes Relative: 10.4 % (ref 3.0–12.0)
Neutro Abs: 3.7 10*3/uL (ref 1.4–7.7)
Neutrophils Relative %: 56.5 % (ref 43.0–77.0)
Platelets: 185 10*3/uL (ref 150.0–400.0)
RBC: 4.73 Mil/uL (ref 4.22–5.81)
RDW: 13 % (ref 11.5–15.5)
WBC: 6.5 10*3/uL (ref 4.0–10.5)

## 2021-01-01 LAB — PSA, MEDICARE: PSA: 2.41 ng/ml (ref 0.10–4.00)

## 2021-01-01 LAB — LIPID PANEL
Cholesterol: 120 mg/dL (ref 0–200)
HDL: 46.1 mg/dL (ref >39.00)
LDL Cholesterol: 60 mg/dL (ref 0–99)
NonHDL: 73.52
Total CHOL/HDL Ratio: 3
Triglycerides: 69 mg/dL (ref 0.0–149.0)
VLDL: 13.8 mg/dL (ref 0.0–40.0)

## 2021-01-01 LAB — COMPREHENSIVE METABOLIC PANEL
ALT: 28 U/L (ref 0–53)
AST: 32 U/L (ref 0–37)
Albumin: 4.3 g/dL (ref 3.5–5.2)
Alkaline Phosphatase: 92 U/L (ref 39–117)
BUN: 22 mg/dL (ref 6–23)
CO2: 28 mEq/L (ref 19–32)
Calcium: 9.5 mg/dL (ref 8.4–10.5)
Chloride: 104 mEq/L (ref 96–112)
Creatinine, Ser: 0.98 mg/dL (ref 0.40–1.50)
GFR: 76.66 mL/min (ref >60.00)
Glucose, Bld: 85 mg/dL (ref 70–99)
Potassium: 5 mEq/L (ref 3.5–5.1)
Sodium: 140 mEq/L (ref 135–145)
Total Bilirubin: 1.1 mg/dL (ref 0.2–1.2)
Total Protein: 6.9 g/dL (ref 6.0–8.3)

## 2021-01-01 LAB — TSH: TSH: 2.09 u[IU]/mL (ref 0.35–4.50)

## 2021-01-04 ENCOUNTER — Ambulatory Visit: Payer: Medicare HMO

## 2021-01-08 ENCOUNTER — Other Ambulatory Visit: Payer: Self-pay

## 2021-01-08 ENCOUNTER — Ambulatory Visit (INDEPENDENT_AMBULATORY_CARE_PROVIDER_SITE_OTHER): Payer: Medicare HMO | Admitting: Family Medicine

## 2021-01-08 ENCOUNTER — Encounter: Payer: Self-pay | Admitting: Family Medicine

## 2021-01-08 VITALS — BP 110/64 | HR 52 | Temp 97.6°F | Ht 67.25 in | Wt 186.0 lb

## 2021-01-08 DIAGNOSIS — K219 Gastro-esophageal reflux disease without esophagitis: Secondary | ICD-10-CM | POA: Diagnosis not present

## 2021-01-08 DIAGNOSIS — Z0000 Encounter for general adult medical examination without abnormal findings: Secondary | ICD-10-CM

## 2021-01-08 DIAGNOSIS — I700 Atherosclerosis of aorta: Secondary | ICD-10-CM | POA: Diagnosis not present

## 2021-01-08 DIAGNOSIS — Z125 Encounter for screening for malignant neoplasm of prostate: Secondary | ICD-10-CM | POA: Diagnosis not present

## 2021-01-08 DIAGNOSIS — I10 Essential (primary) hypertension: Secondary | ICD-10-CM | POA: Diagnosis not present

## 2021-01-08 DIAGNOSIS — E7800 Pure hypercholesterolemia, unspecified: Secondary | ICD-10-CM

## 2021-01-08 MED ORDER — RAMIPRIL 10 MG PO CAPS: 10.0000 mg | ORAL_CAPSULE | Freq: Every day | ORAL | 3 refills | Status: DC

## 2021-01-08 MED ORDER — PANTOPRAZOLE SODIUM 40 MG PO TBEC: 40.0000 mg | DELAYED_RELEASE_TABLET | Freq: Every day | ORAL | 3 refills | Status: DC

## 2021-01-08 MED ORDER — ATORVASTATIN CALCIUM 40 MG PO TABS: 40.0000 mg | ORAL_TABLET | Freq: Every day | ORAL | 3 refills | Status: DC

## 2021-01-08 NOTE — Assessment & Plan Note (Signed)
bp in fair control at this time  BP Readings from Last 1 Encounters:  01/08/21 110/64   No changes needed Most recent labs reviewed  Disc lifstyle change with low sodium diet and exercise  Plan to continue metoprolol 25 mg bid and remipril 10 mg daily

## 2021-01-08 NOTE — Assessment & Plan Note (Signed)
No clinical changes Lab Results  Component Value Date   PSA 2.41 01/01/2021   PSA 2.55 01/02/2020   PSA 2.37 01/01/2019    Will continue to monitor

## 2021-01-08 NOTE — Assessment & Plan Note (Signed)
Reviewed health habits including diet and exercise and skin cancer prevention Reviewed appropriate screening tests for age  Also reviewed health mt list, fam hx and immunization status , as well as social and family history   See HPI Labs reviewed  covid immunized with booster Recommend shingrix if it is covered Colonoscopy is utd No prostate concerns and stable psa Commended good health habits

## 2021-01-08 NOTE — Assessment & Plan Note (Addendum)
Disc goals for lipids and reasons to control them Rev last labs with pt Rev low sat fat diet in detail  Plan to continue atorvastatin daily  In setting of CAD and aortic atherosclerosis

## 2021-01-08 NOTE — Assessment & Plan Note (Signed)
No clinical changes Under care of cardiology  HTN and lipids are controlled

## 2021-01-08 NOTE — Patient Instructions (Addendum)
If you are interested in the new shingles vaccine (Shingrix) - call your local pharmacy to check on coverage and availability  If affordable, get on a wait list at your pharmacy to get the vaccine.   Take care of yourself  Stay active/ find other exercise when you cannot get outdoors   Use sun protection to prevent skin cancer   Labs are stable   Avoid red meat/ fried foods/ egg yolks/ fatty breakfast meats/ butter, cheese and high fat dairy/ and shellfish

## 2021-01-08 NOTE — Progress Notes (Signed)
Subjective:    Patient ID: Brian Todd., male    DOB: February 18, 1946, 75 y.o.   MRN: [id number]  This visit occurred during the SARS-CoV-2 public health emergency.  Safety protocols were in place, including screening questions prior to the visit, additional usage of staff PPE, and extensive cleaning of exam room while observing appropriate contact time as indicated for disinfecting solutions.   HPI Here for health maintenance exam and to review chronic medical problems    Wt Readings from Last 3 Encounters:  01/08/21 186 lb (84.4 kg)  04/09/20 188 lb 4 oz (85.4 kg)  01/07/20 186 lb 5 oz (84.5 kg)   28.92 kg/m Diet -good  Exercise- working in the yard physically every day  Plays golf once per year   Feeling good   Colonoscopy 2020 - 10 y   Covid immunized with booster  Zoster status- has not had shingrix    Prostate health  Lab Results  Component Value Date   PSA 2.41 01/01/2021   PSA 2.55 01/02/2020   PSA 2.37 01/01/2019    No issues  Seldom nocturia    HTN in setting of CAD BP Readings from Last 3 Encounters:  01/08/21 110/64  04/09/20 140/78  01/07/20 126/64   Pulse Readings from Last 3 Encounters:  01/08/21 (!) 52  04/09/20 (!) 49  01/07/20 (!) 47    Metoprolol 25 mg bid Ramipril 10 mg daily   Asa and plavix Nothing new cardiology wise   GERD Protonix 40 mg daily  Diet is good / improved a bit     Hyperlipidemia  Lab Results  Component Value Date   CHOL 120 01/01/2021   CHOL 107 01/02/2020   CHOL 120 01/01/2019   Lab Results  Component Value Date   HDL 46.10 01/01/2021   HDL 37.60 (L) 01/02/2020   HDL 43.10 01/01/2019   Lab Results  Component Value Date   LDLCALC 60 01/01/2021   LDLCALC 49 01/02/2020   LDLCALC 58 01/01/2019   Lab Results  Component Value Date   TRIG 69.0 01/01/2021   TRIG 105.0 01/02/2020   TRIG 93.0 01/01/2019   Lab Results  Component Value Date   CHOLHDL 3 01/01/2021   CHOLHDL 3 01/02/2020   CHOLHDL  3 01/01/2019   Lab Results  Component Value Date   LDLDIRECT 76.5 09/02/2010   Atorvastatin 40 mg daily  Lab Results  Component Value Date   WBC 6.5 01/01/2021   HGB 14.8 01/01/2021   HCT 43.7 01/01/2021   MCV 92.3 01/01/2021   PLT 185.0 01/01/2021   Lab Results  Component Value Date   CREATININE 0.98 01/01/2021   BUN 22 01/01/2021   NA 140 01/01/2021   K 5.0 01/01/2021   CL 104 01/01/2021   CO2 28 01/01/2021   Lab Results  Component Value Date   ALT 28 01/01/2021   AST 32 01/01/2021   ALKPHOS 92 01/01/2021   BILITOT 1.1 01/01/2021   Lab Results  Component Value Date   TSH 2.09 01/01/2021     Patient Active Problem List   Diagnosis Date Noted   Aortic atherosclerosis ([facility]) 09/30/2018   Prostate cancer screening 11/26/2016   Routine general medical examination at a health care facility 11/17/2015   Coronary artery disease involving native coronary artery without angina pectoris 04/09/2015   History of heart artery stent 01/01/2015   History of colitis 11/08/2014   Encounter for Medicare annual wellness exam 10/31/2014   DEGENERATIVE JOINT DISEASE 07/30/2009  HEARING LOSS, MILD 11/27/2007   IRRITABLE BOWEL SYNDROME 11/27/2007   Hyperlipidemia 08/13/2007   Essential hypertension 08/13/2007   GERD 08/13/2007   Past Medical History:  Diagnosis Date   Colitis    Coronary artery disease    DJD (degenerative joint disease)    Gallbladder disease    GERD (gastroesophageal reflux disease)    Hearing loss    Heart murmur    Hyperlipidemia    Hypertension    IBS (irritable bowel syndrome)    Increased prostate specific antigen (PSA) velocity    Past Surgical History:  Procedure Laterality Date   CARDIAC CATHETERIZATION     CARDIAC CATHETERIZATION N/A 01/07/2015   Procedure: Left Heart Cath and Coronary Angiography;  Surgeon: [name], MD;  Location: [facility];  Service: Cardiovascular;  Laterality: N/A;   CARDIAC CATHETERIZATION N/A  01/07/2015   Procedure: Coronary Stent Intervention;  Surgeon: [name], MD;  Location: [facility];  Service: Cardiovascular;  Laterality: N/A;   COLONOSCOPY     CORONARY STENT PLACEMENT     Dr. [name]   CORONARY STENT PLACEMENT  01/07/2015   LAD   TONSILLECTOMY AND ADENOIDECTOMY     in the 2nd grade   Social History   Tobacco Use   Smoking status: Never   Smokeless tobacco: Never  Vaping Use   Vaping Use: Never used  Substance Use Topics   Alcohol use: Yes    Alcohol/week: 0.0 standard drinks    Comment: rarely drinks beer/approx. 1 case per year   Drug use: No   Family History  Problem Relation Age of Onset   Stroke Mother    Colon cancer Neg Hx    Esophageal cancer Neg Hx    Rectal cancer Neg Hx    Stomach cancer Neg Hx    Allergies  Allergen Reactions   Ciprofloxacin Other (See Comments)    Jittery, uneasy feeling.   Doxycycline     Headaches    Flagyl [Metronidazole]    Current Outpatient Medications on File Prior to Visit  Medication Sig Dispense Refill   aspirin EC 81 MG tablet Take 1 tablet (81 mg total) by mouth daily.     clopidogrel (PLAVIX) 75 MG tablet Take 1 tablet (75 mg total) by mouth daily. 90 tablet 2   metoprolol tartrate (LOPRESSOR) 25 MG tablet Take 1 tablet (25 mg total) by mouth 2 (two) times daily. 180 tablet 2   Multiple Vitamins-Minerals (MULTIVITAMIN & MINERAL PO) Take 1 tablet by mouth daily.     nitroGLYCERIN (NITROSTAT) 0.4 MG SL tablet PLACE 1 TABLET (0.4 MG TOTAL) UNDER THE TONGUE EVERY 5 MINUTES AS NEEDED FOR CHEST PAIN. 25 tablet 0   polyethylene glycol (MIRALAX) packet Take 17 g by mouth daily. 14 each 0   No current facility-administered medications on file prior to visit.     Review of Systems  Constitutional:  Negative for activity change, appetite change, fatigue, fever and unexpected weight change.  HENT:  Negative for congestion, rhinorrhea, sore throat and trouble swallowing.   Eyes:  Negative for pain,  redness, itching and visual disturbance.  Respiratory:  Negative for cough, chest tightness, shortness of breath and wheezing.   Cardiovascular:  Negative for chest pain and palpitations.  Gastrointestinal:  Negative for abdominal pain, blood in stool, constipation, diarrhea and nausea.  Endocrine: Negative for cold intolerance, heat intolerance, polydipsia and polyuria.  Genitourinary:  Negative for difficulty urinating, dysuria, frequency and urgency.  Musculoskeletal:  Positive for arthralgias. Negative for joint swelling and myalgias.  Skin:  Negative for pallor and rash.       Thick toe nails-has used otc product  Neurological:  Negative for dizziness, tremors, weakness, numbness and headaches.  Hematological:  Negative for adenopathy. Does not bruise/bleed easily.  Psychiatric/Behavioral:  Negative for decreased concentration and dysphoric mood. The patient is not nervous/anxious.       Objective:   Physical Exam Constitutional:      General: He is not in acute distress.    Appearance: Normal appearance. He is well-developed and normal weight. He is not ill-appearing or diaphoretic.  HENT:     Head: Normocephalic and atraumatic.     Right Ear: Tympanic membrane, ear canal and external ear normal.     Left Ear: Tympanic membrane, ear canal and external ear normal.     Nose: Nose normal. No congestion.     Mouth/Throat:     Mouth: Mucous membranes are moist.     Pharynx: Oropharynx is clear. No posterior oropharyngeal erythema.  Eyes:     General: No scleral icterus.       Right eye: No discharge.        Left eye: No discharge.     Conjunctiva/sclera: Conjunctivae normal.     Pupils: Pupils are equal, round, and reactive to light.  Neck:     Thyroid: No thyromegaly.     Vascular: No carotid bruit or JVD.  Cardiovascular:     Rate and Rhythm: Normal rate and regular rhythm.     Pulses: Normal pulses.     Heart sounds: Normal heart sounds.    No gallop.  Pulmonary:      Effort: Pulmonary effort is normal. No respiratory distress.     Breath sounds: Normal breath sounds. No wheezing or rales.     Comments: Good air exch Chest:     Chest wall: No tenderness.  Abdominal:     General: Bowel sounds are normal. There is no distension or abdominal bruit.     Palpations: Abdomen is soft. There is no mass.     Tenderness: There is no abdominal tenderness.     Hernia: No hernia is present.  Musculoskeletal:        General: No tenderness.     Cervical back: Normal range of motion and neck supple. No rigidity. No muscular tenderness.     Right lower leg: No edema.     Left lower leg: No edema.     Comments: No kyphosis   Lymphadenopathy:     Cervical: No cervical adenopathy.  Skin:    General: Skin is warm and dry.     Coloration: Skin is not pale.     Findings: Erythema present. No rash.     Comments: Toe nails are thickened and brittle but well trimmed   Solar lentigines diffusely Tanned   Neurological:     Mental Status: He is alert.     Cranial Nerves: No cranial nerve deficit.     Motor: No abnormal muscle tone.     Coordination: Coordination normal.     Gait: Gait normal.     Deep Tendon Reflexes: Reflexes are normal and symmetric. Reflexes normal.  Psychiatric:        Mood and Affect: Mood normal.        Cognition and Memory: Cognition and memory normal.          Assessment & Plan:   Problem List Items Addressed This  Visit       Cardiovascular and Mediastinum   Essential hypertension    bp in fair control at this time  BP Readings from Last 1 Encounters:  01/08/21 110/64  No changes needed Most recent labs reviewed  Disc lifstyle change with low sodium diet and exercise  Plan to continue metoprolol 25 mg bid and remipril 10 mg daily        Relevant Medications   ramipril (ALTACE) 10 MG capsule   atorvastatin (LIPITOR) 40 MG tablet   Aortic atherosclerosis ([facility])    No clinical changes Under care of cardiology  HTN and  lipids are controlled       Relevant Medications   ramipril (ALTACE) 10 MG capsule   atorvastatin (LIPITOR) 40 MG tablet     Digestive   GERD    Takes protonix daily (used to take bid) Enc to try and wean if possible by taking every other day for 2-4 wk  Expecting a few days of acid rebound but if this does not pass return to it  Would like to get off in the future       Relevant Medications   pantoprazole (PROTONIX) 40 MG tablet     Other   Hyperlipidemia    Disc goals for lipids and reasons to control them Rev last labs with pt Rev low sat fat diet in detail  Plan to continue atorvastatin daily  In setting of CAD and aortic atherosclerosis         Relevant Medications   ramipril (ALTACE) 10 MG capsule   atorvastatin (LIPITOR) 40 MG tablet   Routine general medical examination at a health care facility - Primary    Reviewed health habits including diet and exercise and skin cancer prevention Reviewed appropriate screening tests for age  Also reviewed health mt list, fam hx and immunization status , as well as social and family history   See HPI Labs reviewed  covid immunized with booster Recommend shingrix if it is covered Colonoscopy is utd No prostate concerns and stable psa Commended good health habits        Prostate cancer screening    No clinical changes Lab Results  Component Value Date   PSA 2.41 01/01/2021   PSA 2.55 01/02/2020   PSA 2.37 01/01/2019    Will continue to monitor

## 2021-01-08 NOTE — Assessment & Plan Note (Signed)
Takes protonix daily (used to take bid) Enc to try and wean if possible by taking every other day for 2-4 wk  Expecting a few days of acid rebound but if this does not pass return to it  Would like to get off in the future

## 2021-01-19 ENCOUNTER — Ambulatory Visit: Payer: Medicare HMO

## 2021-01-20 ENCOUNTER — Ambulatory Visit (INDEPENDENT_AMBULATORY_CARE_PROVIDER_SITE_OTHER): Payer: Medicare HMO

## 2021-01-20 ENCOUNTER — Other Ambulatory Visit: Payer: Self-pay

## 2021-01-20 DIAGNOSIS — Z0000 Encounter for general adult medical examination without abnormal findings: Secondary | ICD-10-CM | POA: Diagnosis not present

## 2021-01-20 NOTE — Patient Instructions (Signed)
[name] , Thank you for taking time to come for your Medicare Wellness Visit. I appreciate your ongoing commitment to your health goals. Please review the following plan we discussed and let me know if I can assist you in the future.   Screening recommendations/referrals: Colonoscopy: Up to date, completed 11/29/2018, no longer required  Recommended yearly ophthalmology/optometry visit for glaucoma screening and checkup Recommended yearly dental visit for hygiene and checkup  Vaccinations: Influenza vaccine: Up to date, completed 04/09/2020, due 02/2021 Pneumococcal vaccine: Completed series Tdap vaccine: decline-insurance Shingles vaccine: due, check with your insurance regarding coverage if interested    Covid-19: completed 3 vaccines  Advanced directives: Advance directive discussed with you today. Even though you declined this today please call our office should you change your mind and we can give you the proper paperwork for you to fill out.  Conditions/risks identified: hypertension, hyperlipidemia   Next appointment: Follow up in one year for your annual wellness visit.   Preventive Care 41 Years and Older, Male Preventive care refers to lifestyle choices and visits with your health care provider that can promote health and wellness. What does preventive care include? A yearly physical exam. This is also called an annual well check. Dental exams once or twice a year. Routine eye exams. Ask your health care provider how often you should have your eyes checked. Personal lifestyle choices, including: Daily care of your teeth and gums. Regular physical activity. Eating a healthy diet. Avoiding tobacco and drug use. Limiting alcohol use. Practicing safe sex. Taking low doses of aspirin every day. Taking vitamin and mineral supplements as recommended by your health care provider. What happens during an annual well check? The services and screenings done by your health care  provider during your annual well check will depend on your age, overall health, lifestyle risk factors, and family history of disease. Counseling  Your health care provider may ask you questions about your: Alcohol use. Tobacco use. Drug use. Emotional well-being. Home and relationship well-being. Sexual activity. Eating habits. History of falls. Memory and ability to understand (cognition). Work and work Statistician. Screening  You may have the following tests or measurements: Height, weight, and BMI. Blood pressure. Lipid and cholesterol levels. These may be checked every 5 years, or more frequently if you are over 4 years old. Skin check. Lung cancer screening. You may have this screening every year starting at age 59 if you have a 30-pack-year history of smoking and currently smoke or have quit within the past 15 years. Fecal occult blood test (FOBT) of the stool. You may have this test every year starting at age 71. Flexible sigmoidoscopy or colonoscopy. You may have a sigmoidoscopy every 5 years or a colonoscopy every 10 years starting at age 62. Prostate cancer screening. Recommendations will vary depending on your family history and other risks. Hepatitis C blood test. Hepatitis B blood test. Sexually transmitted disease (STD) testing. Diabetes screening. This is done by checking your blood sugar (glucose) after you have not eaten for a while (fasting). You may have this done every 1-3 years. Abdominal aortic aneurysm (AAA) screening. You may need this if you are a current or former smoker. Osteoporosis. You may be screened starting at age 56 if you are at high risk. Talk with your health care provider about your test results, treatment options, and if necessary, the need for more tests. Vaccines  Your health care provider may recommend certain vaccines, such as: Influenza vaccine. This is recommended every year.  Tetanus, diphtheria, and acellular pertussis (Tdap, Td)  vaccine. You may need a Td booster every 10 years. Zoster vaccine. You may need this after age 27. Pneumococcal 13-valent conjugate (PCV13) vaccine. One dose is recommended after age 35. Pneumococcal polysaccharide (PPSV23) vaccine. One dose is recommended after age 42. Talk to your health care provider about which screenings and vaccines you need and how often you need them. This information is not intended to replace advice given to you by your health care provider. Make sure you discuss any questions you have with your health care provider. Document Released: 08/07/2015 Document Revised: 03/30/2016 Document Reviewed: 05/12/2015 Elsevier Interactive Patient Education  2017 [place] Prevention in the Home Falls can cause injuries. They can happen to people of all ages. There are many things you can do to make your home safe and to help prevent falls. What can I do on the outside of my home? Regularly fix the edges of walkways and driveways and fix any cracks. Remove anything that might make you trip as you walk through a door, such as a raised step or threshold. Trim any bushes or trees on the path to your home. Use bright outdoor lighting. Clear any walking paths of anything that might make someone trip, such as rocks or tools. Regularly check to see if handrails are loose or broken. Make sure that both sides of any steps have handrails. Any raised decks and porches should have guardrails on the edges. Have any leaves, snow, or ice cleared regularly. Use sand or salt on walking paths during winter. Clean up any spills in your garage right away. This includes oil or grease spills. What can I do in the bathroom? Use night lights. Install grab bars by the toilet and in the tub and shower. Do not use towel bars as grab bars. Use non-skid mats or decals in the tub or shower. If you need to sit down in the shower, use a plastic, non-slip stool. Keep the floor dry. Clean up any  water that spills on the floor as soon as it happens. Remove soap buildup in the tub or shower regularly. Attach bath mats securely with double-sided non-slip rug tape. Do not have throw rugs and other things on the floor that can make you trip. What can I do in the bedroom? Use night lights. Make sure that you have a light by your bed that is easy to reach. Do not use any sheets or blankets that are too big for your bed. They should not hang down onto the floor. Have a firm chair that has side arms. You can use this for support while you get dressed. Do not have throw rugs and other things on the floor that can make you trip. What can I do in the kitchen? Clean up any spills right away. Avoid walking on wet floors. Keep items that you use a lot in easy-to-reach places. If you need to reach something above you, use a strong step stool that has a grab bar. Keep electrical cords out of the way. Do not use floor polish or wax that makes floors slippery. If you must use wax, use non-skid floor wax. Do not have throw rugs and other things on the floor that can make you trip. What can I do with my stairs? Do not leave any items on the stairs. Make sure that there are handrails on both sides of the stairs and use them. Fix handrails that are broken or loose.  Make sure that handrails are as long as the stairways. Check any carpeting to make sure that it is firmly attached to the stairs. Fix any carpet that is loose or worn. Avoid having throw rugs at the top or bottom of the stairs. If you do have throw rugs, attach them to the floor with carpet tape. Make sure that you have a light switch at the top of the stairs and the bottom of the stairs. If you do not have them, ask someone to add them for you. What else can I do to help prevent falls? Wear shoes that: Do not have high heels. Have rubber bottoms. Are comfortable and fit you well. Are closed at the toe. Do not wear sandals. If you use a  stepladder: Make sure that it is fully opened. Do not climb a closed stepladder. Make sure that both sides of the stepladder are locked into place. Ask someone to hold it for you, if possible. Clearly mark and make sure that you can see: Any grab bars or handrails. First and last steps. Where the edge of each step is. Use tools that help you move around (mobility aids) if they are needed. These include: Canes. Walkers. Scooters. Crutches. Turn on the lights when you go into a dark area. Replace any light bulbs as soon as they burn out. Set up your furniture so you have a clear path. Avoid moving your furniture around. If any of your floors are uneven, fix them. If there are any pets around you, be aware of where they are. Review your medicines with your doctor. Some medicines can make you feel dizzy. This can increase your chance of falling. Ask your doctor what other things that you can do to help prevent falls. This information is not intended to replace advice given to you by your health care provider. Make sure you discuss any questions you have with your health care provider. Document Released: 05/07/2009 Document Revised: 12/17/2015 Document Reviewed: 08/15/2014 Elsevier Interactive Patient Education  2017 [employer].

## 2021-01-20 NOTE — Progress Notes (Signed)
Subjective:   Brian Todd Sr. is a 75 y.o. male who presents for Medicare Annual/Subsequent preventive examination.  Review of Systems: N/A      I connected with the patient today by telephone and verified that I am speaking with the correct person using two identifiers. Location patient: home Location nurse: work Persons participating in the telephone visit: patient, nurse.   I discussed the limitations, risks, security and privacy concerns of performing an evaluation and management service by telephone and the availability of in person appointments. I also discussed with the patient that there may be a patient responsible charge related to this service. The patient expressed understanding and verbally consented to this telephonic visit.        Cardiac Risk Factors include: advanced age (>57men, >62 women);Other (see comment), Risk factor comments: hyperlipidemia     Objective:    Today's Vitals   There is no height or weight on file to calculate BMI.  Advanced Directives 01/20/2021 01/02/2020 01/01/2019 09/27/2018 12/12/2017 12/01/2016 12/01/2015  Does Patient Have a Medical Advance Directive? No No No No No No No  Does patient want to make changes to medical advance directive? Yes (MAU/Ambulatory/Procedural Areas - Information given) - - - - - -  Would patient like information on creating a medical advance directive? - No - Patient declined No - Patient declined No - Patient declined Yes (MAU/Ambulatory/Procedural Areas - Information given) - Yes - Educational materials given    Current Medications (verified) Outpatient Encounter Medications as of 01/20/2021  Medication Sig   aspirin EC 81 MG tablet Take 1 tablet (81 mg total) by mouth daily.   atorvastatin (LIPITOR) 40 MG tablet Take 1 tablet (40 mg total) by mouth daily.   clopidogrel (PLAVIX) 75 MG tablet Take 1 tablet (75 mg total) by mouth daily.   metoprolol tartrate (LOPRESSOR) 25 MG tablet Take 1 tablet (25 mg total) by  mouth 2 (two) times daily.   Multiple Vitamins-Minerals (MULTIVITAMIN & MINERAL PO) Take 1 tablet by mouth daily.   nitroGLYCERIN (NITROSTAT) 0.4 MG SL tablet PLACE 1 TABLET (0.4 MG TOTAL) UNDER THE TONGUE EVERY 5 MINUTES AS NEEDED FOR CHEST PAIN.   pantoprazole (PROTONIX) 40 MG tablet Take 1 tablet (40 mg total) by mouth daily.   polyethylene glycol (MIRALAX) packet Take 17 g by mouth daily.   ramipril (ALTACE) 10 MG capsule Take 1 capsule (10 mg total) by mouth daily.   No facility-administered encounter medications on file as of 01/20/2021.    Allergies (verified) Ciprofloxacin, Doxycycline, and Flagyl [metronidazole]   History: Past Medical History:  Diagnosis Date   Colitis    Coronary artery disease    DJD (degenerative joint disease)    Gallbladder disease    GERD (gastroesophageal reflux disease)    Hearing loss    Heart murmur    Hyperlipidemia    Hypertension    IBS (irritable bowel syndrome)    Increased prostate specific antigen (PSA) velocity    Past Surgical History:  Procedure Laterality Date   CARDIAC CATHETERIZATION     CARDIAC CATHETERIZATION N/A 01/07/2015   Procedure: Left Heart Cath and Coronary Angiography;  Surgeon: [name], MD;  Location: [facility];  Service: Cardiovascular;  Laterality: N/A;   CARDIAC CATHETERIZATION N/A 01/07/2015   Procedure: Coronary Stent Intervention;  Surgeon: [name], MD;  Location: [facility];  Service: Cardiovascular;  Laterality: N/A;   COLONOSCOPY     CORONARY STENT PLACEMENT  Dr. [name]   CORONARY STENT PLACEMENT  01/07/2015   LAD   TONSILLECTOMY AND ADENOIDECTOMY     in the 2nd grade   Family History  Problem Relation Age of Onset   Stroke Mother    Colon cancer Neg Hx    Esophageal cancer Neg Hx    Rectal cancer Neg Hx    Stomach cancer Neg Hx    Social History   Socioeconomic History   Marital status: Married    Spouse name: linda wrenn [place]   Number of children: 2    Years of education: Not on file   Highest education level: Not on file  Occupational History   Occupation: Engineer, production    Comment: Retired  Tobacco Use   Smoking status: Never   Smokeless tobacco: Never  Vaping Use   Vaping Use: Never used  Substance and Sexual Activity   Alcohol use: Yes    Alcohol/week: 0.0 standard drinks    Comment: rarely drinks beer/approx. 1 case per year   Drug use: No   Sexual activity: Yes  Other Topics Concern   Not on file  Social History Narrative   Not on file   Social Determinants of Health   Financial Resource Strain: Low Risk    Difficulty of Paying Living Expenses: Not hard at all  Food Insecurity: No Food Insecurity   Worried About Charity fundraiser in the Last Year: Never true   [place] in the Last Year: Never true  Transportation Needs: No Transportation Needs   Lack of Transportation (Medical): No   Lack of Transportation (Non-Medical): No  Physical Activity: Inactive   Days of Exercise per Week: 0 days   Minutes of Exercise per Session: 0 min  Stress: No Stress Concern Present   Feeling of Stress : Not at all  Social Connections: Not on file    Tobacco Counseling Counseling given: Not Answered   Clinical Intake:  Pre-visit preparation completed: Yes  Pain : No/denies pain     Nutritional Risks: None Diabetes: No  How often do you need to have someone help you when you read instructions, pamphlets, or other written materials from your doctor or pharmacy?: 1 - Never  Diabetic: No Nutrition Risk Assessment:  Has the patient had any N/V/D within the last 2 months?  No  Does the patient have any non-healing wounds?  No  Has the patient had any unintentional weight loss or weight gain?  No   Diabetes:  Is the patient diabetic?  No  If diabetic, was a CBG obtained today?   N/A Did the patient bring in their glucometer from home?   N/A How often do you monitor your CBG's? N/A.   Financial Strains and  Diabetes Management:  Are you having any financial strains with the device, your supplies or your medication?  N/A .  Does the patient want to be seen by Chronic Care Management for management of their diabetes?   N/A Would the patient like to be referred to a Nutritionist or for Diabetic Management?   N/A   Interpreter Needed?: No  Information entered by :: CJohnson, LPN   Activities of Daily Living In your present state of health, do you have any difficulty performing the following activities: 01/20/2021 01/08/2021  Hearing? N N  Vision? N N  Difficulty concentrating or making decisions? N N  Walking or climbing stairs? N N  Dressing or bathing? N N  Doing errands, shopping? N N  Preparing Food and eating ? N -  Using the Toilet? N -  In the past six months, have you accidently leaked urine? N -  Do you have problems with loss of bowel control? N -  Managing your Medications? N -  Managing your Finances? N -  Housekeeping or managing your Housekeeping? N -  Some recent data might be hidden    Patient Care Team: Tower, Wynelle Fanny, MD as PCP - General (Family Medicine) [name], MD as PCP - Cardiology (Cardiology) Ralene Bathe, MD as Consulting Physician (Ophthalmology) [name], MD as Consulting Physician (Cardiology)  Indicate any recent Medical Services you may have received from other than [facility] providers in the past year (date may be approximate).     Assessment:   This is a routine wellness examination for Samaj.  Hearing/Vision screen Vision Screening - Comments:: Patient gets annual eye exams   Dietary issues and exercise activities discussed: Current Exercise Habits: The patient does not participate in regular exercise at present, Exercise limited by: None identified   Goals Addressed             This Visit's Progress    Patient Stated       01/20/2021, I will maintain and continue medications as prescribed.         Depression  Screen PHQ 2/9 Scores 01/20/2021 01/08/2021 01/02/2020 01/01/2019 12/12/2017 12/01/2016 12/01/2015  PHQ - 2 Score 0 0 0 0 0 0 0  PHQ- 9 Score 0 0 0 0 0 - -    Fall Risk Fall Risk  01/20/2021 01/08/2021 01/02/2020 01/01/2019 12/12/2017  Falls in the past year? 0 0 0 0 No  Number falls in past yr: 0 0 0 - -  Injury with Fall? 0 0 0 - -  Risk for fall due to : Medication side effect - Medication side effect - -  Follow up Falls evaluation completed;Falls prevention discussed - Falls evaluation completed;Falls prevention discussed - -    FALL RISK PREVENTION PERTAINING TO THE HOME:  Any stairs in or around the home? Yes  If so, are there any without handrails? No  Home free of loose throw rugs in walkways, pet beds, electrical cords, etc? Yes  Adequate lighting in your home to reduce risk of falls? Yes   ASSISTIVE DEVICES UTILIZED TO PREVENT FALLS:  Life alert? No  Use of a cane, walker or w/c? No  Grab bars in the bathroom? No  Shower chair or bench in shower? No  Elevated toilet seat or a handicapped toilet? No   TIMED UP AND GO:  Was the test performed?  N/A telephone visit .   Cognitive Function: MMSE - Mini Mental State Exam 01/20/2021 01/02/2020 01/01/2019 12/12/2017 12/01/2016  Orientation to time 5 5 5 5 5   Orientation to Place 5 5 5 5 5   Registration 3 3 3 3 3   Attention/ Calculation 5 5 0 0 0  Recall 3 3 3 2 3   Recall-comments - - - unable to recall 1 of 3 words -  Language- name 2 objects - - 0 0 0  Language- repeat 1 1 1 1 1   Language- follow 3 step command - - 0 3 3  Language- read & follow direction - - 0 0 0  Write a sentence - - 0 0 0  Copy design - - 0 0 0  Total score - - 17 19 20   Mini Cog  Mini-Cog screen was completed. Maximum score is  22. A value of 0 denotes this part of the MMSE was not completed or the patient failed this part of the Mini-Cog screening.       Immunizations Immunization History  Administered Date(s) Administered   Fluad Quad(high Dose 65+)  03/28/2019   Influenza Split 04/16/2011, 04/26/2012   Influenza Whole 03/25/2009, 03/25/2010   Influenza, High Dose Seasonal PF 05/13/2017, 04/11/2018, 04/09/2020   Influenza, Seasonal, Injecte, Preservative Fre 04/28/2016   Influenza,inj,Quad PF,6+ Mos 04/10/2013, 04/02/2014, 05/04/2015   Influenza-Unspecified 05/13/2017   PFIZER(Purple Top)SARS-COV-2 Vaccination 09/09/2019, 10/09/2019, 05/21/2020   Pneumococcal Conjugate-13 10/31/2014   Pneumococcal Polysaccharide-23 09/16/2013   Td 09/02/2010    TDAP status: Due, Education has been provided regarding the importance of this vaccine. Advised may receive this vaccine at local pharmacy or Health Dept. Aware to provide a copy of the vaccination record if obtained from local pharmacy or Health Dept. Verbalized acceptance and understanding.  Flu Vaccine status: Up to date  Pneumococcal vaccine status: Up to date  Covid-19 vaccine status: Completed 3 vaccines  Qualifies for Shingles Vaccine? Yes   Zostavax completed No   Shingrix Completed?: No.    Education has been provided regarding the importance of this vaccine. Patient has been advised to call insurance company to determine out of pocket expense if they have not yet received this vaccine. Advised may also receive vaccine at local pharmacy or Health Dept. Verbalized acceptance and understanding.  Screening Tests Health Maintenance  Topic Date Due   Zoster Vaccines- Shingrix (1 of 2) Never done   COVID-19 Vaccine (4 - Booster for Pfizer series) 09/21/2020   TETANUS/TDAP  01/21/2024 (Originally 09/02/2020)   INFLUENZA VACCINE  02/22/2021   Hepatitis C Screening  Completed   PNA vac Low Risk Adult  Completed   HPV VACCINES  Aged Out    Health Maintenance  Health Maintenance Due  Topic Date Due   Zoster Vaccines- Shingrix (1 of 2) Never done   COVID-19 Vaccine (4 - Booster for Pfizer series) 09/21/2020    Colorectal cancer screening: No longer required.   Lung Cancer  Screening: (Low Dose CT Chest recommended if Age 59-80 years, 30 pack-year currently smoking OR have quit w/in 15 years.) does not qualify.   Additional Screening:  Hepatitis C Screening: does qualify; Completed 12/01/2015  Vision Screening: Recommended annual ophthalmology exams for early detection of glaucoma and other disorders of the eye. Is the patient up to date with their annual eye exam?   Yes Who is the provider or what is the name of the office in which the patient attends annual eye exams? [facility], Dr. Delman Cheadle If pt is not established with a provider, would they like to be referred to a provider to establish care? No .   Dental Screening: Recommended annual dental exams for proper oral hygiene  Community Resource Referral / Chronic Care Management: CRR required this visit?  No   CCM required this visit?  No      Plan:     I have personally reviewed and noted the following in the patient's chart:   Medical and social history Use of alcohol, tobacco or illicit drugs  Current medications and supplements including opioid prescriptions. Patient is not currently taking opioid prescriptions. Functional ability and status Nutritional status Physical activity Advanced directives List of other physicians Hospitalizations, surgeries, and ER visits in previous 12 months Vitals Screenings to include cognitive, depression, and falls Referrals and appointments  In addition, I have reviewed and discussed with patient certain  preventive protocols, quality metrics, and best practice recommendations. A written personalized care plan for preventive services as well as general preventive health recommendations were provided to patient.   Due to this being a telephonic visit, the after visit summary with patients personalized plan was offered to patient via office or my-chart. Patient preferred to pick up at office at next visit.   Andrez Grime, LPN   [id number]

## 2021-01-20 NOTE — Progress Notes (Signed)
PCP notes:  Health Maintenance: Shingrix- due   Abnormal Screenings: none   Patient concerns: none   Nurse concerns: none   Next PCP appt.: none

## 2021-04-22 ENCOUNTER — Encounter: Payer: Self-pay | Admitting: Cardiovascular Disease

## 2021-04-22 ENCOUNTER — Ambulatory Visit: Payer: Medicare HMO | Admitting: Cardiovascular Disease

## 2021-04-22 ENCOUNTER — Other Ambulatory Visit: Payer: Self-pay

## 2021-04-22 VITALS — BP 126/60 | HR 52 | Ht 67.0 in | Wt 192.1 lb

## 2021-04-22 DIAGNOSIS — E785 Hyperlipidemia, unspecified: Secondary | ICD-10-CM

## 2021-04-22 DIAGNOSIS — I10 Essential (primary) hypertension: Secondary | ICD-10-CM

## 2021-04-22 DIAGNOSIS — I2510 Atherosclerotic heart disease of native coronary artery without angina pectoris: Secondary | ICD-10-CM

## 2021-04-22 MED ORDER — METOPROLOL TARTRATE 25 MG PO TABS: 25.0000 mg | ORAL_TABLET | Freq: Two times a day (BID) | ORAL | 2 refills | Status: DC

## 2021-04-22 MED ORDER — NITROGLYCERIN 0.4 MG SL SUBL: SUBLINGUAL_TABLET | SUBLINGUAL | 1 refills | Status: DC

## 2021-04-22 MED ORDER — CLOPIDOGREL BISULFATE 75 MG PO TABS: 75.0000 mg | ORAL_TABLET | Freq: Every day | ORAL | 2 refills | Status: DC

## 2021-04-22 NOTE — Progress Notes (Signed)
Cardiology Office Note   Date:  04/22/2021   ID:  Brian KIEDROWSKI Sr., DOB September 24, 1945, MRN [id number]  PCP:  Brian Greenspan, MD  Cardiologist:   Kathlyn Sacramento, MD   Chief Complaint  Patient presents with   Other    12 month f/u no complaints today. Meds reviewed verbally with pt.      History of Present Illness: Brian Todd. is a 75 y.o. male who presents for a follow-up visit  regarding coronary artery disease. He had a small non-ST elevation myocardial infarction in 2001. Cardiac catheterization showed a 95% mid LAD stenosis and 40% stenosis in OM branch. He had successful angioplasty and Taxus drug-eluting stent placement to the mid LAD without complications.  He had recurrent angina in June 2016. Cardiac catheterization showed 99% in-stent restenosis in the mid LAD extending to the distal edge, 60% mid left circumflex stenosis and mild RCA disease. I performed successful angioplasty and drug-eluting stent placement to the mid LAD.  He has other chronic medical conditions that include hypertension, hyperlipidemia and gastroesophageal reflux disease.  Has been doing extremely well with no chest pain, shortness of breath or palpitations.  He takes his medications regularly.  He stays active working in his yard with different projects.  No exertional symptoms.   Past Medical History:  Diagnosis Date   Colitis    Coronary artery disease    DJD (degenerative joint disease)    Gallbladder disease    GERD (gastroesophageal reflux disease)    Hearing loss    Heart murmur    Hyperlipidemia    Hypertension    IBS (irritable bowel syndrome)    Increased prostate specific antigen (PSA) velocity     Past Surgical History:  Procedure Laterality Date   CARDIAC CATHETERIZATION     CARDIAC CATHETERIZATION N/A 01/07/2015   Procedure: Left Heart Cath and Coronary Angiography;  Surgeon: [name], MD;  Location: [facility];  Service: Cardiovascular;  Laterality:  N/A;   CARDIAC CATHETERIZATION N/A 01/07/2015   Procedure: Coronary Stent Intervention;  Surgeon: [name], MD;  Location: [facility];  Service: Cardiovascular;  Laterality: N/A;   COLONOSCOPY     CORONARY STENT PLACEMENT     Dr. [name]   CORONARY [place]  01/07/2015   LAD   TONSILLECTOMY AND ADENOIDECTOMY     in the 2nd grade     Current Outpatient Medications  Medication Sig Dispense Refill   aspirin EC 81 MG tablet Take 1 tablet (81 mg total) by mouth daily.     atorvastatin (LIPITOR) 40 MG tablet Take 1 tablet (40 mg total) by mouth daily. 90 tablet 3   clopidogrel (PLAVIX) 75 MG tablet Take 1 tablet (75 mg total) by mouth daily. 90 tablet 2   metoprolol tartrate (LOPRESSOR) 25 MG tablet Take 1 tablet (25 mg total) by mouth 2 (two) times daily. 180 tablet 2   Multiple Vitamins-Minerals (MULTIVITAMIN & MINERAL PO) Take 1 tablet by mouth daily.     nitroGLYCERIN (NITROSTAT) 0.4 MG SL tablet PLACE 1 TABLET (0.4 MG TOTAL) UNDER THE TONGUE EVERY 5 MINUTES AS NEEDED FOR CHEST PAIN. 25 tablet 0   pantoprazole (PROTONIX) 40 MG tablet Take 1 tablet (40 mg total) by mouth daily. 90 tablet 3   polyethylene glycol (MIRALAX) packet Take 17 g by mouth daily. 14 each 0   ramipril (ALTACE) 10 MG capsule Take 1 capsule (10 mg total) by mouth daily. [place]  capsule 3   No current facility-administered medications for this visit.    Allergies:   Ciprofloxacin, Doxycycline, and Flagyl [metronidazole]    Social History:  The patient  reports that he has never smoked. He has never used smokeless tobacco. He reports current alcohol use. He reports that he does not use drugs.   Family History:  The patient's family history includes Stroke in his mother.    ROS:  Please see the history of present illness.   Otherwise, review of systems are positive for none.   All other systems are reviewed and negative.    PHYSICAL EXAM: VS:  BP 126/60 (BP Location: Left Arm, Patient Position:  Sitting, Cuff Size: Normal)   Pulse (!) 52   Ht 5\' 7"  (1.702 m)   Wt 192 lb 2 oz (87.1 kg)   SpO2 99%   BMI 30.09 kg/m  , BMI Body mass index is 30.09 kg/m. GEN: Well nourished, well developed, in no acute distress  HEENT: normal  Neck: no JVD, carotid bruits, or masses Cardiac: RRR; no murmurs, rubs, or gallops,no edema  Respiratory:  clear to auscultation bilaterally, normal work of breathing GI: soft, nontender, nondistended, + BS MS: no deformity or atrophy  Skin: warm and dry, no rash Neuro:  Strength and sensation are intact Psych: euthymic mood, full affect   EKG:  EKG is ordered today. The ekg ordered today demonstrates sinus bradycardia with no significant ST or T wave changes.  Heart rate is 52 bpm.   Recent Labs: 01/01/2021: ALT 28; BUN 22; Creatinine, Ser 0.98; Hemoglobin 14.8; Platelets 185.0; Potassium 5.0; Sodium 140; TSH 2.09    Lipid Panel    Component Value Date/Time   CHOL 120 01/01/2021 0911   TRIG 69.0 01/01/2021 0911   HDL 46.10 01/01/2021 0911   CHOLHDL 3 01/01/2021 0911   VLDL 13.8 01/01/2021 0911   LDLCALC 60 01/01/2021 0911   LDLDIRECT 76.5 09/02/2010 1036      Wt Readings from Last 3 Encounters:  04/22/21 192 lb 2 oz (87.1 kg)  01/08/21 186 lb (84.4 kg)  04/09/20 188 lb 4 oz (85.4 kg)        ASSESSMENT AND PLAN:  1.  Coronary artery disease involving native coronary arteries without angina: The patient overall is doing well from a cardiac standpoint. Given that he has a stent within a stent and also has a previous Taxus drug-eluting stent, I am planning to keep him on  dual antiplatelet therapy indefinitely as tolerated.  We refilled his clopidogrel and Nitrostat today.  2. Hyperlipidemia: I reviewed most recent lipid profile done in June which showed an LDL of 60 and triglyceride of 69.  Excellent control with atorvastatin 40 mg daily which will be continued.  3. Essential hypertension: Blood pressure is well controlled.  He has  chronic bradycardia and metoprolol but he is asymptomatic.  We refilled metoprolol today.   Disposition:   FU with me in 1 year  Signed,  Kathlyn Sacramento, MD  04/22/2021 9:31 AM    [place]

## 2021-04-22 NOTE — Patient Instructions (Signed)
Medication Instructions:  Your physician recommends that you continue on your current medications as directed. Please refer to the Current Medication list given to you today.  Your cardiac medications have been refilled today.  *If you need a refill on your cardiac medications before your next appointment, please call your pharmacy*   Lab Work: None ordered If you have labs (blood work) drawn today and your tests are completely normal, you will receive your results only by: [place] (if you have MyChart) OR A paper copy in the mail If you have any lab test that is abnormal or we need to change your treatment, we will call you to review the results.   Testing/Procedures: None ordered   Follow-Up: At [facility], you and your health needs are our priority.  As part of our continuing mission to provide you with exceptional heart care, we have created designated Provider Care Teams.  These Care Teams include your primary Cardiologist (physician) and Advanced Practice Providers (APPs -  Physician Assistants and Nurse Practitioners) who all work together to provide you with the care you need, when you need it.  We recommend signing up for the patient portal called "MyChart".  Sign up information is provided on this After Visit Summary.  MyChart is used to connect with patients for Virtual Visits (Telemedicine).  Patients are able to view lab/test results, encounter notes, upcoming appointments, etc.  Non-urgent messages can be sent to your provider as well.   To learn more about what you can do with MyChart, go to [email or web address].    Your next appointment:   Your physician wants you to follow-up in: 1 year You will receive a reminder letter in the mail two months in advance. If you don't receive a letter, please call our office to schedule the follow-up appointment.   The format for your next appointment:   In Person  Provider:   You may see Kathlyn Sacramento, MD or one of  the following Advanced Practice Providers on your designated Care Team:   Murray Hodgkins, NP Christell Faith, PA-C Marrianne Mood, PA-C Cadence Kathlen Mody, [place]   Other Instructions N/A

## 2021-06-24 DIAGNOSIS — H903 Sensorineural hearing loss, bilateral: Secondary | ICD-10-CM | POA: Diagnosis not present

## 2021-06-24 DIAGNOSIS — H6123 Impacted cerumen, bilateral: Secondary | ICD-10-CM | POA: Diagnosis not present

## 2021-07-13 DIAGNOSIS — H5203 Hypermetropia, bilateral: Secondary | ICD-10-CM | POA: Diagnosis not present

## 2021-07-13 DIAGNOSIS — H2513 Age-related nuclear cataract, bilateral: Secondary | ICD-10-CM | POA: Diagnosis not present

## 2021-07-13 DIAGNOSIS — H52203 Unspecified astigmatism, bilateral: Secondary | ICD-10-CM | POA: Diagnosis not present

## 2021-09-09 DIAGNOSIS — H524 Presbyopia: Secondary | ICD-10-CM | POA: Diagnosis not present

## 2021-09-09 DIAGNOSIS — H52223 Regular astigmatism, bilateral: Secondary | ICD-10-CM | POA: Diagnosis not present

## 2021-10-07 ENCOUNTER — Other Ambulatory Visit: Payer: Self-pay

## 2021-10-07 ENCOUNTER — Ambulatory Visit (INDEPENDENT_AMBULATORY_CARE_PROVIDER_SITE_OTHER): Payer: Medicare HMO | Admitting: Family

## 2021-10-07 ENCOUNTER — Encounter: Payer: Self-pay | Admitting: Family

## 2021-10-07 VITALS — BP 148/60 | HR 52 | Temp 97.6°F | Ht 68.0 in | Wt 202.0 lb

## 2021-10-07 DIAGNOSIS — H903 Sensorineural hearing loss, bilateral: Secondary | ICD-10-CM | POA: Diagnosis not present

## 2021-10-07 DIAGNOSIS — H9311 Tinnitus, right ear: Secondary | ICD-10-CM

## 2021-10-07 DIAGNOSIS — J301 Allergic rhinitis due to pollen: Secondary | ICD-10-CM | POA: Diagnosis not present

## 2021-10-07 MED ORDER — PREDNISONE 20 MG PO TABS: ORAL_TABLET | ORAL | 0 refills | Status: DC

## 2021-10-07 MED ORDER — FLUTICASONE PROPIONATE 50 MCG/ACT NA SUSP: 2.0000 | Freq: Every day | NASAL | 6 refills | Status: DC

## 2021-10-07 NOTE — Progress Notes (Signed)
? ?Established Patient Office Visit ? ?Subjective:  ?Patient ID: Brian Coil., male    DOB: 1946-07-05  Age: 76 y.o. MRN: [id number] ? ?CC:  ?Chief Complaint  ?Patient presents with  ?? Ear Pain  ?  Pt stated--right ear is painful especially when turning the head--2 weeks  ? ? ?HPI ?Brian Glance Sr. is here today with concerns.  ? ?For the last two weeks with aching in his right ear that won't seem to go away. Worse with turning head as well or sitting looking at tv , feels some increased pain inside the ear. States has always had some ringing/fluid feeling of fullness in the ears. No sore throat, no sinus pressure. Does have post nasal drip especially at night with frequent clearing of throat. Started taking zyrtec that has helped some.  ? ?Past Medical History:  ?Diagnosis Date  ?? Colitis   ?? Coronary artery disease   ?? DJD (degenerative joint disease)   ?? Gallbladder disease   ?? GERD (gastroesophageal reflux disease)   ?? Hearing loss   ?? Heart murmur   ?? Hyperlipidemia   ?? Hypertension   ?? IBS (irritable bowel syndrome)   ?? Increased prostate specific antigen (PSA) velocity   ? ? ?Past Surgical History:  ?Procedure Laterality Date  ?? CARDIAC CATHETERIZATION    ?? CARDIAC CATHETERIZATION N/A 01/07/2015  ? Procedure: Left Heart Cath and Coronary Angiography;  Surgeon: [name], MD;  Location: [facility];  Service: Cardiovascular;  Laterality: N/A;  ?? CARDIAC CATHETERIZATION N/A 01/07/2015  ? Procedure: Coronary Stent Intervention;  Surgeon: [name], MD;  Location: [facility];  Service: Cardiovascular;  Laterality: N/A;  ?? COLONOSCOPY    ?? CORONARY STENT PLACEMENT    ? Dr. [name]  ?? CORONARY STENT PLACEMENT  01/07/2015  ? LAD  ?? TONSILLECTOMY AND ADENOIDECTOMY    ? in the 2nd grade  ? ? ?Family History  ?Problem Relation Age of Onset  ?? Stroke Mother   ?? Colon cancer Neg Hx   ?? Esophageal cancer Neg Hx   ?? Rectal cancer Neg Hx   ?? Stomach cancer Neg Hx    ? ? ?Social History  ? ?Socioeconomic History  ?? Marital status: Married  ?  Spouse name: linda wrenn Schumacher  ?? Number of children: 2  ?? Years of education: Not on file  ?? Highest education level: Not on file  ?Occupational History  ?? Occupation: Engineer, production  ?  Comment: Retired  ?Tobacco Use  ?? Smoking status: Never  ?? Smokeless tobacco: Never  ?Vaping Use  ?? Vaping Use: Never used  ?Substance and Sexual Activity  ?? Alcohol use: Yes  ?  Alcohol/week: 0.0 standard drinks  ?  Comment: rarely drinks beer/approx. 1 case per year  ?? Drug use: No  ?? Sexual activity: Yes  ?Other Topics Concern  ?? Not on file  ?Social History Narrative  ?? Not on file  ? ?Social Determinants of Health  ? ?Financial Resource Strain: Low Risk   ?? Difficulty of Paying Living Expenses: Not hard at all  ?Food Insecurity: No Food Insecurity  ?? Worried About Charity fundraiser in the Last Year: Never true  ?? Ran Out of Food in the Last Year: Never true  ?Transportation Needs: No Transportation Needs  ?? Lack of Transportation (Medical): No  ?? Lack of Transportation (Non-Medical): No  ?Physical Activity: Inactive  ?? Days of Exercise per Week: 0 days  ??  Minutes of Exercise per Session: 0 min  ?Stress: No Stress Concern Present  ?? Feeling of Stress : Not at all  ?Social Connections: Not on file  ?Intimate Partner Violence: Not At Risk  ?? Fear of Current or Ex-Partner: No  ?? Emotionally Abused: No  ?? Physically Abused: No  ?? Sexually Abused: No  ? ? ?Outpatient Medications Prior to Visit  ?Medication Sig Dispense Refill  ?? aspirin EC 81 MG tablet Take 1 tablet (81 mg total) by mouth daily.    ?? atorvastatin (LIPITOR) 40 MG tablet Take 1 tablet (40 mg total) by mouth daily. 90 tablet 3  ?? cetirizine (ZYRTEC) 10 MG tablet Take 10 mg by mouth daily.    ?? clopidogrel (PLAVIX) 75 MG tablet Take 1 tablet (75 mg total) by mouth daily. 90 tablet 2  ?? metoprolol tartrate (LOPRESSOR) 25 MG tablet Take 1 tablet (25 mg total) by  mouth 2 (two) times daily. 180 tablet 2  ?? Multiple Vitamins-Minerals (MULTIVITAMIN & MINERAL PO) Take 1 tablet by mouth daily.    ?? nitroGLYCERIN (NITROSTAT) 0.4 MG SL tablet PLACE 1 TABLET (0.4 MG TOTAL) UNDER THE TONGUE EVERY 5 MINUTES AS NEEDED FOR CHEST PAIN. 25 tablet 1  ?? pantoprazole (PROTONIX) 40 MG tablet Take 1 tablet (40 mg total) by mouth daily. 90 tablet 3  ?? polyethylene glycol (MIRALAX) packet Take 17 g by mouth daily. 14 each 0  ?? ramipril (ALTACE) 10 MG capsule Take 1 capsule (10 mg total) by mouth daily. 90 capsule 3  ? ?No facility-administered medications prior to visit.  ? ? ?Allergies  ?Allergen Reactions  ?? Ciprofloxacin Other (See Comments)  ?  Jittery, uneasy feeling.  ?? Doxycycline   ?  Headaches   ?? Flagyl [Metronidazole]   ? ? ?ROS ?Review of Systems  ?Constitutional:  Negative for chills and fever.  ?HENT:  Positive for ear pain (right ear with fullness and whoosing sound) and postnasal drip. Negative for congestion, sinus pain and sore throat.   ?Respiratory:  Negative for cough, shortness of breath and wheezing.   ?Cardiovascular:  Negative for chest pain and palpitations.  ? ?  ?Objective:  ?  ?Physical Exam ?Constitutional:   ?   General: He is awake. He is not in acute distress. ?   Appearance: Normal appearance. He is not ill-appearing.  ?HENT:  ?   Right Ear: External ear normal. Decreased hearing noted. There is no impacted cerumen.  ?   Left Ear: External ear normal. Decreased hearing noted. Tenderness (inner ear canal right) present. There is no impacted cerumen.  ?   Ears:  ?   Comments: Very narrow canals unable to visualize TM bil  ?   Nose: Nose normal.  ?   Right Turbinates: Not enlarged or swollen.  ?   Left Turbinates: Not enlarged or swollen.  ?   Right Sinus: No maxillary sinus tenderness or frontal sinus tenderness.  ?   Left Sinus: No maxillary sinus tenderness or frontal sinus tenderness.  ?   Mouth/Throat:  ?   Mouth: Mucous membranes are moist.  ?    Pharynx: No pharyngeal swelling, oropharyngeal exudate or posterior oropharyngeal erythema.  ?Eyes:  ?   Extraocular Movements: Extraocular movements intact.  ?   Pupils: Pupils are equal, round, and reactive to light.  ?Cardiovascular:  ?   Rate and Rhythm: Normal rate and regular rhythm.  ?Pulmonary:  ?   Effort: Pulmonary effort is normal.  ?   Breath  sounds: Normal breath sounds. No wheezing.  ?Lymphadenopathy:  ?   Cervical: No cervical adenopathy.  ?   Right cervical: No posterior cervical adenopathy. ?   Left cervical: No posterior cervical adenopathy.  ?Neurological:  ?   Mental Status: He is alert.  ? ? ?BP (!) 148/60   Pulse (!) 52   Temp 97.6 ?F (36.4 ?C)   Ht '5\' 8"'$  (1.727 m)   Wt 202 lb (91.6 kg)   SpO2 99%   BMI 30.71 kg/m?  ?Wt Readings from Last 3 Encounters:  ?10/07/21 202 lb (91.6 kg)  ?04/22/21 192 lb 2 oz (87.1 kg)  ?01/08/21 186 lb (84.4 kg)  ? ? ? ?Health Maintenance Due  ?Topic Date Due  ?? Zoster Vaccines- Shingrix (1 of 2) Never done  ?? COVID-19 Vaccine (4 - Booster for Pfizer series) 07/16/2020  ?? INFLUENZA VACCINE  02/22/2021  ? ? ?There are no preventive care reminders to display for this patient. ? ?Lab Results  ?Component Value Date  ? TSH 2.09 01/01/2021  ? ?Lab Results  ?Component Value Date  ? WBC 6.5 01/01/2021  ? HGB 14.8 01/01/2021  ? HCT 43.7 01/01/2021  ? MCV 92.3 01/01/2021  ? PLT 185.0 01/01/2021  ? ?Lab Results  ?Component Value Date  ? NA 140 01/01/2021  ? K 5.0 01/01/2021  ? CO2 28 01/01/2021  ? GLUCOSE 85 01/01/2021  ? BUN 22 01/01/2021  ? CREATININE 0.98 01/01/2021  ? BILITOT 1.1 01/01/2021  ? ALKPHOS 92 01/01/2021  ? AST 32 01/01/2021  ? ALT 28 01/01/2021  ? PROT 6.9 01/01/2021  ? ALBUMIN 4.3 01/01/2021  ? CALCIUM 9.5 01/01/2021  ? ANIONGAP 7 09/27/2018  ? GFR 76.66 01/01/2021  ? ?No results found for: HGBA1C ? ?  ?Assessment & Plan:  ? ?Problem List Items Addressed This Visit   ? ?  ? Respiratory  ? Seasonal allergic rhinitis due to pollen - Primary  ?  Pt also  with chronic rhinitis, seasonal ?Advised daily flonase and zyrtec  ?  ?  ? Relevant Medications  ? fluticasone (FLONASE) 50 MCG/ACT nasal spray  ? predniSONE (DELTASONE) 20 MG tablet  ?  ? Nervous and Auditory  ? Hear

## 2021-10-07 NOTE — Patient Instructions (Signed)
Start daily flonase continue with daily zyrtec ?Sending prednisone to help clear up fluid behind the ear.  ?If no improvement to see ENT.  ? ?It was a pleasure seeing you today! Please do not hesitate to reach out with any questions and or concerns. ? ?Regards,  ? ?Liat Mayol ?FNP-C ? ?

## 2021-10-08 DIAGNOSIS — H9311 Tinnitus, right ear: Secondary | ICD-10-CM | POA: Insufficient documentation

## 2021-10-08 NOTE — Assessment & Plan Note (Signed)
Referral to ENT in place, possible impacted cerumen ?Unable to visualize TM due to very narrow canal  ?Also with tinnitus for ent to eval ?

## 2021-10-08 NOTE — Assessment & Plan Note (Signed)
Pt also with chronic rhinitis, seasonal ?Advised daily flonase and zyrtec  ?

## 2021-10-08 NOTE — Assessment & Plan Note (Signed)
Referral to ent in place ?Eval/treat ?Possible impacted cerumen ?

## 2021-10-12 DIAGNOSIS — D225 Melanocytic nevi of trunk: Secondary | ICD-10-CM | POA: Diagnosis not present

## 2021-10-12 DIAGNOSIS — L918 Other hypertrophic disorders of the skin: Secondary | ICD-10-CM | POA: Diagnosis not present

## 2021-10-12 DIAGNOSIS — L821 Other seborrheic keratosis: Secondary | ICD-10-CM | POA: Diagnosis not present

## 2021-10-12 DIAGNOSIS — D1801 Hemangioma of skin and subcutaneous tissue: Secondary | ICD-10-CM | POA: Diagnosis not present

## 2021-10-12 DIAGNOSIS — L720 Epidermal cyst: Secondary | ICD-10-CM | POA: Diagnosis not present

## 2021-10-12 DIAGNOSIS — L814 Other melanin hyperpigmentation: Secondary | ICD-10-CM | POA: Diagnosis not present

## 2021-10-13 ENCOUNTER — Encounter: Payer: Self-pay | Admitting: *Deleted

## 2022-01-02 ENCOUNTER — Telehealth: Payer: Self-pay | Admitting: Family Medicine

## 2022-01-02 DIAGNOSIS — Z125 Encounter for screening for malignant neoplasm of prostate: Secondary | ICD-10-CM

## 2022-01-02 DIAGNOSIS — E7800 Pure hypercholesterolemia, unspecified: Secondary | ICD-10-CM

## 2022-01-02 DIAGNOSIS — I10 Essential (primary) hypertension: Secondary | ICD-10-CM

## 2022-01-02 NOTE — Telephone Encounter (Signed)
-----   Message from Ellamae Sia sent at 12/28/2021  3:58 PM EDT ----- Regarding: lab orders for Monday 6.12.23 Patient is scheduled for CPX labs, please order future labs, Thanks , Karna Christmas

## 2022-01-03 ENCOUNTER — Other Ambulatory Visit: Payer: Medicare HMO

## 2022-01-04 ENCOUNTER — Other Ambulatory Visit (INDEPENDENT_AMBULATORY_CARE_PROVIDER_SITE_OTHER): Payer: Medicare HMO

## 2022-01-04 DIAGNOSIS — E7800 Pure hypercholesterolemia, unspecified: Secondary | ICD-10-CM

## 2022-01-04 DIAGNOSIS — Z125 Encounter for screening for malignant neoplasm of prostate: Secondary | ICD-10-CM | POA: Diagnosis not present

## 2022-01-04 DIAGNOSIS — I10 Essential (primary) hypertension: Secondary | ICD-10-CM | POA: Diagnosis not present

## 2022-01-04 LAB — CBC WITH DIFFERENTIAL/PLATELET
Basophils Absolute: 0 10*3/uL (ref 0.0–0.1)
Basophils Relative: 0.5 % (ref 0.0–3.0)
Eosinophils Absolute: 0.2 10*3/uL (ref 0.0–0.7)
Eosinophils Relative: 2.7 % (ref 0.0–5.0)
HCT: 44.8 % (ref 39.0–52.0)
Hemoglobin: 14.7 g/dL (ref 13.0–17.0)
Lymphocytes Relative: 30.9 % (ref 12.0–46.0)
Lymphs Abs: 2 10*3/uL (ref 0.7–4.0)
MCHC: 32.8 g/dL (ref 30.0–36.0)
MCV: 93.4 fl (ref 78.0–100.0)
Monocytes Absolute: 0.7 10*3/uL (ref 0.1–1.0)
Monocytes Relative: 10.4 % (ref 3.0–12.0)
Neutro Abs: 3.6 10*3/uL (ref 1.4–7.7)
Neutrophils Relative %: 55.5 % (ref 43.0–77.0)
Platelets: 196 10*3/uL (ref 150.0–400.0)
RBC: 4.8 Mil/uL (ref 4.22–5.81)
RDW: 12.9 % (ref 11.5–15.5)
WBC: 6.4 10*3/uL (ref 4.0–10.5)

## 2022-01-04 LAB — COMPREHENSIVE METABOLIC PANEL
ALT: 19 U/L (ref 0–53)
AST: 19 U/L (ref 0–37)
Albumin: 4.1 g/dL (ref 3.5–5.2)
Alkaline Phosphatase: 100 U/L (ref 39–117)
BUN: 20 mg/dL (ref 6–23)
CO2: 31 mEq/L (ref 19–32)
Calcium: 9.8 mg/dL (ref 8.4–10.5)
Chloride: 103 mEq/L (ref 96–112)
Creatinine, Ser: 1.09 mg/dL (ref 0.40–1.50)
GFR: 67 mL/min (ref >60.00)
Glucose, Bld: 89 mg/dL (ref 70–99)
Potassium: 4.7 mEq/L (ref 3.5–5.1)
Sodium: 138 mEq/L (ref 135–145)
Total Bilirubin: 1 mg/dL (ref 0.2–1.2)
Total Protein: 6.7 g/dL (ref 6.0–8.3)

## 2022-01-04 LAB — TSH: TSH: 2.52 u[IU]/mL (ref 0.35–5.50)

## 2022-01-04 LAB — LIPID PANEL
Cholesterol: 122 mg/dL (ref 0–200)
HDL: 44.6 mg/dL (ref >39.00)
LDL Cholesterol: 51 mg/dL (ref 0–99)
NonHDL: 77.75
Total CHOL/HDL Ratio: 3
Triglycerides: 134 mg/dL (ref 0.0–149.0)
VLDL: 26.8 mg/dL (ref 0.0–40.0)

## 2022-01-04 LAB — PSA, MEDICARE: PSA: 3.83 ng/ml (ref 0.10–4.00)

## 2022-01-11 ENCOUNTER — Other Ambulatory Visit: Payer: Medicare HMO

## 2022-01-13 ENCOUNTER — Ambulatory Visit (INDEPENDENT_AMBULATORY_CARE_PROVIDER_SITE_OTHER): Payer: Medicare HMO | Admitting: Family Medicine

## 2022-01-13 ENCOUNTER — Encounter: Payer: Self-pay | Admitting: Family Medicine

## 2022-01-13 VITALS — BP 138/74 | HR 84 | Ht 68.0 in | Wt 195.1 lb

## 2022-01-13 DIAGNOSIS — I700 Atherosclerosis of aorta: Secondary | ICD-10-CM

## 2022-01-13 DIAGNOSIS — I10 Essential (primary) hypertension: Secondary | ICD-10-CM | POA: Diagnosis not present

## 2022-01-13 DIAGNOSIS — Z0000 Encounter for general adult medical examination without abnormal findings: Secondary | ICD-10-CM | POA: Diagnosis not present

## 2022-01-13 DIAGNOSIS — K219 Gastro-esophageal reflux disease without esophagitis: Secondary | ICD-10-CM | POA: Diagnosis not present

## 2022-01-13 DIAGNOSIS — R972 Elevated prostate specific antigen [PSA]: Secondary | ICD-10-CM | POA: Diagnosis not present

## 2022-01-13 DIAGNOSIS — Z125 Encounter for screening for malignant neoplasm of prostate: Secondary | ICD-10-CM

## 2022-01-13 DIAGNOSIS — E7800 Pure hypercholesterolemia, unspecified: Secondary | ICD-10-CM

## 2022-01-13 DIAGNOSIS — H903 Sensorineural hearing loss, bilateral: Secondary | ICD-10-CM | POA: Diagnosis not present

## 2022-01-13 MED ORDER — PANTOPRAZOLE SODIUM 40 MG PO TBEC: 40.0000 mg | DELAYED_RELEASE_TABLET | Freq: Every day | ORAL | 3 refills | Status: DC

## 2022-01-13 MED ORDER — ATORVASTATIN CALCIUM 40 MG PO TABS
40.0000 mg | ORAL_TABLET | Freq: Every day | ORAL | 3 refills | Status: DC
Start: 2022-01-13 — End: 2023-01-16

## 2022-01-13 MED ORDER — RAMIPRIL 10 MG PO CAPS
10.0000 mg | ORAL_CAPSULE | Freq: Every day | ORAL | 3 refills | Status: DC
Start: 2022-01-13 — End: 2022-08-08

## 2022-01-13 NOTE — Assessment & Plan Note (Signed)
Lab Results  Component Value Date   PSA 3.83 01/04/2022   PSA 2.41 01/01/2021   PSA 2.55 01/02/2020    Urology referral done

## 2022-01-13 NOTE — Assessment & Plan Note (Signed)
bp in fair control at this time  BP Readings from Last 1 Encounters:  01/13/22 138/74   No changes needed Most recent labs reviewed  Disc lifstyle change with low sodium diet and exercise  Plan to continue Metoprolol 25 mg bid Ramipril 10 mg daily  Under care of cardiology

## 2022-01-13 NOTE — Assessment & Plan Note (Signed)
Under care of cardiology for this and CAD Doing well  bp and cholesterol are controlled

## 2022-01-13 NOTE — Progress Notes (Signed)
Subjective:    Patient ID: Brian Coil., male    DOB: [birth year], 76 y.o.   MRN: [id number]  HPI Here for health maintenance exam and to review chronic medical problems    Wt Readings from Last 3 Encounters:  01/13/22 195 lb 1.6 oz (88.5 kg)  10/07/21 202 lb (91.6 kg)  04/22/21 192 lb 2 oz (87.1 kg)   29.66 kg/m  Doing well  Taking care of himself  Slipped/fell playing with great grandson /no injuries    Immunization History  Administered Date(s) Administered   Fluad Quad(high Dose 65+) 03/28/2019   Influenza Split 04/16/2011, 04/26/2012   Influenza Whole 03/25/2009, 03/25/2010   Influenza, High Dose Seasonal PF 05/13/2017, 04/11/2018, 04/09/2020   Influenza, Seasonal, Injecte, Preservative Fre 04/28/2016   Influenza,inj,Quad PF,6+ Mos 04/10/2013, 04/02/2014, 05/04/2015   Influenza-Unspecified 05/13/2017   PFIZER(Purple Top)SARS-COV-2 Vaccination 09/09/2019, 10/09/2019, 05/21/2020, 06/27/2021   Pneumococcal Conjugate-13 10/31/2014   Pneumococcal Polysaccharide-23 09/16/2013   Td 09/02/2010   Tetanus postponed for insurance   Colon cancer screening 11/2018 colonoscopy   Prostate health Lab Results  Component Value Date   PSA 3.83 01/04/2022   PSA 2.41 01/01/2021   PSA 2.55 01/02/2020   Has seen derm for screen/exam    HTN bp is stable today  No cp or palpitations or headaches or edema  No side effects to medicines  BP Readings from Last 3 Encounters:  01/13/22 138/74  10/07/21 (!) 148/60  04/22/21 126/60     Pulse Readings from Last 3 Encounters:  01/13/22 84  10/07/21 (!) 52  04/22/21 (!) 52   Metoprolol 25 mg bid Ramipril 10 mg daily   Lab Results  Component Value Date   CREATININE 1.09 01/04/2022   BUN 20 01/04/2022   NA 138 01/04/2022   K 4.7 01/04/2022   CL 103 01/04/2022   CO2 31 01/04/2022   Lab Results  Component Value Date   ALT 19 01/04/2022   AST 19 01/04/2022   ALKPHOS 100 01/04/2022   BILITOT 1.0 01/04/2022      Hyperlipidemia Lab Results  Component Value Date   CHOL 122 01/04/2022   CHOL 120 01/01/2021   CHOL 107 01/02/2020   Lab Results  Component Value Date   HDL 44.60 01/04/2022   HDL 46.10 01/01/2021   HDL 37.60 (L) 01/02/2020   Lab Results  Component Value Date   LDLCALC 51 01/04/2022   LDLCALC 60 01/01/2021   LDLCALC 49 01/02/2020   Lab Results  Component Value Date   TRIG 134.0 01/04/2022   TRIG 69.0 01/01/2021   TRIG 105.0 01/02/2020   Lab Results  Component Value Date   CHOLHDL 3 01/04/2022   CHOLHDL 3 01/01/2021   CHOLHDL 3 01/02/2020   Lab Results  Component Value Date   LDLDIRECT 76.5 09/02/2010   H/o CAD and aortic athero On dual antiplatelet tx  Atorvastatin 40 mg daily   Is eating well / doing well with that/really cut down   Lab Results  Component Value Date   WBC 6.4 01/04/2022   HGB 14.7 01/04/2022   HCT 44.8 01/04/2022   MCV 93.4 01/04/2022   PLT 196.0 01/04/2022   Lab Results  Component Value Date   TSH 2.52 01/04/2022    GERD-unable to get off of protonix After 2 mo of severe reflux he went back on it  Taking 1 instead of 2  Patient Active Problem List   Diagnosis Date Noted   Tinnitus of right ear 10/08/2021  Hearing loss sensory, bilateral 10/07/2021   Seasonal allergic rhinitis due to pollen 10/07/2021   Aortic atherosclerosis ([place]) 09/30/2018   Prostate cancer screening 11/26/2016   Routine general medical examination at a health care facility 11/17/2015   Coronary artery disease involving native coronary artery without angina pectoris 04/09/2015   History of heart artery stent 01/01/2015   History of colitis 11/08/2014   Encounter for Medicare annual wellness exam 10/31/2014   DEGENERATIVE JOINT DISEASE 07/30/2009   Elevated PSA 07/30/2009   HEARING LOSS, MILD 11/27/2007   IRRITABLE BOWEL SYNDROME 11/27/2007   Hyperlipidemia 08/13/2007   Essential hypertension 08/13/2007   GERD 08/13/2007   Past Medical History:   Diagnosis Date   Colitis    Coronary artery disease    DJD (degenerative joint disease)    Gallbladder disease    GERD (gastroesophageal reflux disease)    Hearing loss    Heart murmur    Hyperlipidemia    Hypertension    IBS (irritable bowel syndrome)    Increased prostate specific antigen (PSA) velocity    Past Surgical History:  Procedure Laterality Date   CARDIAC CATHETERIZATION     CARDIAC CATHETERIZATION N/A 01/07/2015   Procedure: Left Heart Cath and Coronary Angiography;  Surgeon: [name], MD;  Location: [facility];  Service: Cardiovascular;  Laterality: N/A;   CARDIAC CATHETERIZATION N/A 01/07/2015   Procedure: Coronary Stent Intervention;  Surgeon: [name], MD;  Location: [facility];  Service: Cardiovascular;  Laterality: N/A;   COLONOSCOPY     CORONARY STENT PLACEMENT     Dr. [name]   CORONARY STENT PLACEMENT  01/07/2015   LAD   TONSILLECTOMY AND ADENOIDECTOMY     in the 2nd grade   Social History   Tobacco Use   Smoking status: Never   Smokeless tobacco: Never  Vaping Use   Vaping Use: Never used  Substance Use Topics   Alcohol use: Yes    Alcohol/week: 0.0 standard drinks of alcohol    Comment: rarely drinks beer/approx. 1 case per year   Drug use: No   Family History  Problem Relation Age of Onset   Stroke Mother    Colon cancer Neg Hx    Esophageal cancer Neg Hx    Rectal cancer Neg Hx    Stomach cancer Neg Hx    Allergies  Allergen Reactions   Ciprofloxacin Other (See Comments)    Jittery, uneasy feeling.   Doxycycline     Headaches    Flagyl [Metronidazole]    Current Outpatient Medications on File Prior to Visit  Medication Sig Dispense Refill   aspirin EC 81 MG tablet Take 1 tablet (81 mg total) by mouth daily.     cetirizine (ZYRTEC) 10 MG tablet Take 10 mg by mouth daily.     clopidogrel (PLAVIX) 75 MG tablet Take 1 tablet (75 mg total) by mouth daily. 90 tablet 2   fluticasone (FLONASE) 50 MCG/ACT  nasal spray Place 2 sprays into both nostrils daily. 16 g 6   metoprolol tartrate (LOPRESSOR) 25 MG tablet Take 1 tablet (25 mg total) by mouth 2 (two) times daily. 180 tablet 2   Multiple Vitamins-Minerals (MULTIVITAMIN & MINERAL PO) Take 1 tablet by mouth daily.     nitroGLYCERIN (NITROSTAT) 0.4 MG SL tablet PLACE 1 TABLET (0.4 MG TOTAL) UNDER THE TONGUE EVERY 5 MINUTES AS NEEDED FOR CHEST PAIN. 25 tablet 1   polyethylene glycol (MIRALAX) packet Take 17 g by mouth daily.  14 each 0   No current facility-administered medications on file prior to visit.     Review of Systems  Constitutional:  Negative for activity change, appetite change, fatigue, fever and unexpected weight change.  HENT:  Negative for congestion, rhinorrhea, sore throat and trouble swallowing.   Eyes:  Negative for pain, redness, itching and visual disturbance.  Respiratory:  Negative for cough, chest tightness, shortness of breath and wheezing.   Cardiovascular:  Negative for chest pain and palpitations.  Gastrointestinal:  Negative for abdominal pain, blood in stool, constipation, diarrhea and nausea.  Endocrine: Negative for cold intolerance, heat intolerance, polydipsia and polyuria.  Genitourinary:  Negative for difficulty urinating, dysuria, frequency and urgency.  Musculoskeletal:  Negative for arthralgias, joint swelling and myalgias.  Skin:  Negative for pallor and rash.  Neurological:  Negative for dizziness, tremors, weakness, numbness and headaches.  Hematological:  Negative for adenopathy. Does not bruise/bleed easily.  Psychiatric/Behavioral:  Negative for decreased concentration and dysphoric mood. The patient is not nervous/anxious.        Objective:   Physical Exam Constitutional:      General: He is not in acute distress.    Appearance: Normal appearance. He is well-developed and normal weight. He is not ill-appearing or diaphoretic.  HENT:     Head: Normocephalic and atraumatic.     Right Ear:  Tympanic membrane, ear canal and external ear normal.     Left Ear: Tympanic membrane, ear canal and external ear normal.     Nose: Nose normal. No congestion.     Mouth/Throat:     Mouth: Mucous membranes are moist.     Pharynx: Oropharynx is clear. No posterior oropharyngeal erythema.  Eyes:     General: No scleral icterus.       Right eye: No discharge.        Left eye: No discharge.     Conjunctiva/sclera: Conjunctivae normal.     Pupils: Pupils are equal, round, and reactive to light.  Neck:     Thyroid: No thyromegaly.     Vascular: No carotid bruit or JVD.  Cardiovascular:     Rate and Rhythm: Normal rate and regular rhythm.     Pulses: Normal pulses.     Heart sounds: Normal heart sounds.     No gallop.  Pulmonary:     Effort: Pulmonary effort is normal. No respiratory distress.     Breath sounds: Normal breath sounds. No wheezing or rales.     Comments: Good air exch Chest:     Chest wall: No tenderness.  Abdominal:     General: Bowel sounds are normal. There is no distension or abdominal bruit.     Palpations: Abdomen is soft. There is no mass.     Tenderness: There is no abdominal tenderness.     Hernia: No hernia is present.  Musculoskeletal:        General: No tenderness.     Cervical back: Normal range of motion and neck supple. No rigidity. No muscular tenderness.     Right lower leg: No edema.     Left lower leg: No edema.     Comments: No kyphosis   Lymphadenopathy:     Cervical: No cervical adenopathy.  Skin:    General: Skin is warm and dry.     Coloration: Skin is not pale.     Findings: No erythema or rash.     Comments: Solar lentigines diffusely   Neurological:     Mental  Status: He is alert.     Cranial Nerves: No cranial nerve deficit.     Motor: No abnormal muscle tone.     Coordination: Coordination normal.     Gait: Gait normal.     Deep Tendon Reflexes: Reflexes are normal and symmetric. Reflexes normal.  Psychiatric:        Mood and  Affect: Mood normal.        Cognition and Memory: Cognition and memory normal.           Assessment & Plan:   Problem List Items Addressed This Visit       Cardiovascular and Mediastinum   Aortic atherosclerosis ([place])    Under care of cardiology for this and CAD Doing well  bp and cholesterol are controlled       Relevant Medications   ramipril (ALTACE) 10 MG capsule   atorvastatin (LIPITOR) 40 MG tablet   Essential hypertension    bp in fair control at this time  BP Readings from Last 1 Encounters:  01/13/22 138/74  No changes needed Most recent labs reviewed  Disc lifstyle change with low sodium diet and exercise  Plan to continue Metoprolol 25 mg bid Ramipril 10 mg daily  Under care of cardiology      Relevant Medications   ramipril (ALTACE) 10 MG capsule   atorvastatin (LIPITOR) 40 MG tablet     Digestive   GERD    Was unable to come off of protonix but did get down to one daily instead of bid  Doing well  Enc to watch diet       Relevant Medications   pantoprazole (PROTONIX) 40 MG tablet     Nervous and Auditory   Hearing loss sensory, bilateral    Partial cerumen impaction  Plans to f/u with ENT for irrigation when ready        Other   Elevated PSA    Lab Results  Component Value Date   PSA 3.83 01/04/2022   PSA 2.41 01/01/2021   PSA 2.55 01/02/2020   No voiding changes This is up  Plan referral to urology to discuss      Relevant Orders   Ambulatory referral to Urology   Hyperlipidemia    Disc goals for lipids and reasons to control them Rev last labs with pt Rev low sat fat diet in detail  Plan to continue atorvastatin 40 mg daily       Relevant Medications   ramipril (ALTACE) 10 MG capsule   atorvastatin (LIPITOR) 40 MG tablet   Prostate cancer screening    Lab Results  Component Value Date   PSA 3.83 01/04/2022   PSA 2.41 01/01/2021   PSA 2.55 01/02/2020   Urology referral done      Routine general medical  examination at a health care facility - Primary    Reviewed health habits including diet and exercise and skin cancer prevention Reviewed appropriate screening tests for age  Also reviewed health mt list, fam hx and immunization status , as well as social and family history   Colonoscopy utd 2020 utd dermatology visits  Ref to urology for elevated psa  Discussed fall prevention and sun protection

## 2022-01-13 NOTE — Assessment & Plan Note (Addendum)
Partial cerumen impaction  Plans to f/u with ENT for irrigation when ready

## 2022-01-13 NOTE — Assessment & Plan Note (Signed)
Was unable to come off of protonix but did get down to one daily instead of bid  Doing well  Enc to watch diet

## 2022-01-13 NOTE — Assessment & Plan Note (Signed)
Lab Results  Component Value Date   PSA 3.83 01/04/2022   PSA 2.41 01/01/2021   PSA 2.55 01/02/2020    No voiding changes This is up  Plan referral to urology to discuss

## 2022-01-13 NOTE — Assessment & Plan Note (Signed)
Disc goals for lipids and reasons to control them Rev last labs with pt Rev low sat fat diet in detail  Plan to continue atorvastatin 40 mg daily  

## 2022-01-13 NOTE — Patient Instructions (Addendum)
I placed a referral to urology -if you don't get a call in 1-2 weeks please let us know   Take care of yourself  Try to get most of your carbohydrates from produce (with the exception of white potatoes)  Eat less bread/pasta/rice/snack foods/cereals/sweets and other items from the middle of the grocery store (processed carbs)  Stay active Try to exercise 30 minutes daily if you can  Use sun protection - sunscreen and/ or clothing

## 2022-01-13 NOTE — Assessment & Plan Note (Signed)
Reviewed health habits including diet and exercise and skin cancer prevention Reviewed appropriate screening tests for age  Also reviewed health mt list, fam hx and immunization status , as well as social and family history   Colonoscopy utd 2020 utd dermatology visits  Ref to urology for elevated psa  Discussed fall prevention and sun protection

## 2022-01-31 ENCOUNTER — Telehealth: Payer: Self-pay | Admitting: Family Medicine

## 2022-01-31 NOTE — Telephone Encounter (Signed)
LVM for pt to rtn my call to schedule AWV with [facility].  

## 2022-02-03 ENCOUNTER — Encounter: Payer: Self-pay | Admitting: Urology

## 2022-02-03 ENCOUNTER — Ambulatory Visit: Payer: Medicare HMO | Admitting: Urology

## 2022-02-03 VITALS — BP 171/79 | HR 52 | Ht 67.0 in | Wt 193.0 lb

## 2022-02-03 DIAGNOSIS — R972 Elevated prostate specific antigen [PSA]: Secondary | ICD-10-CM

## 2022-02-03 DIAGNOSIS — H0012 Chalazion right lower eyelid: Secondary | ICD-10-CM | POA: Diagnosis not present

## 2022-02-03 NOTE — Progress Notes (Signed)
02/03/22 12:22 PM   Brian Glance Sr. 09-10-1945 [id number]  Referring provider:  Abner Greenspan, MD [address] [place],  [place] [address] Chief Complaint  Patient presents with   Elevated PSA    New Patient      HPI: Brian HURWITZ Sr. is a 76 y.o.male who presents today for further evaluation of rising PSA.  He is accompanied by his wife today.  He was seen by his PCP, Dr Glori Bickers, on 01/13/2022. He underwent routine labs and his PSA had risen to 3.83 on 01/04/2022. He was further referred to urology.  PSA trend below.   Patient denies any modifying or aggravating factors.  Patient denies any gross hematuria, dysuria or suprapubic/flank pain.  Patient denies any fevers, chills, nausea or vomiting.   No family history of prostate cancer.  IPSS 0 today.   PSA trend:   PSA  Latest Ref Rng 0.10 - 4.00 ng/ml  12/12/2017 2.25   01/01/2019 2.37   01/02/2020 2.55   01/01/2021 2.41   01/04/2022 3.83      IPSS     Row Name 02/03/22 0800         International Prostate Symptom Score   How often have you had the sensation of not emptying your bladder? Not at All     How often have you had to urinate less than every two hours? Not at All     How often have you found you stopped and started again several times when you urinated? Not at All     How often have you found it difficult to postpone urination? Not at All     How often have you had a weak urinary stream? Not at All     How often have you had to strain to start urination? Not at All     How many times did you typically get up at night to urinate? None     Total IPSS Score 0       Quality of Life due to urinary symptoms   If you were to spend the rest of your life with your urinary condition just the way it is now how would you feel about that? Delighted              Score:  1-7 Mild 8-19 Moderate 20-35 Severe   PMH: Past Medical History:  Diagnosis Date   Colitis    Coronary artery disease     DJD (degenerative joint disease)    Gallbladder disease    GERD (gastroesophageal reflux disease)    Hearing loss    Heart murmur    Hyperlipidemia    Hypertension    IBS (irritable bowel syndrome)    Increased prostate specific antigen (PSA) velocity     Surgical History: Past Surgical History:  Procedure Laterality Date   CARDIAC CATHETERIZATION     CARDIAC CATHETERIZATION N/A 01/07/2015   Procedure: Left Heart Cath and Coronary Angiography;  Surgeon: [name], MD;  Location: [facility];  Service: Cardiovascular;  Laterality: N/A;   CARDIAC CATHETERIZATION N/A 01/07/2015   Procedure: Coronary Stent Intervention;  Surgeon: [name], MD;  Location: [facility];  Service: Cardiovascular;  Laterality: N/A;   COLONOSCOPY     CORONARY STENT PLACEMENT     Dr. [name]   CORONARY STENT PLACEMENT  01/07/2015   LAD   TONSILLECTOMY AND ADENOIDECTOMY     in the 2nd grade  Home Medications:  Allergies as of 02/03/2022       Reactions   Ciprofloxacin Other (See Comments)   Jittery, uneasy feeling.   Doxycycline    Headaches    Flagyl [metronidazole]         Medication List        Accurate as of February 03, 2022 12:22 PM. If you have any questions, ask your nurse or doctor.          STOP taking these medications    cetirizine 10 MG tablet Commonly known as: ZYRTEC Stopped by: Hollice Espy, MD   fluticasone 50 MCG/ACT nasal spray Commonly known as: FLONASE Stopped by: Hollice Espy, MD       TAKE these medications    aspirin EC 81 MG tablet Take 1 tablet (81 mg total) by mouth daily.   atorvastatin 40 MG tablet Commonly known as: LIPITOR Take 1 tablet (40 mg total) by mouth daily.   clopidogrel 75 MG tablet Commonly known as: PLAVIX Take 1 tablet (75 mg total) by mouth daily.   metoprolol tartrate 25 MG tablet Commonly known as: LOPRESSOR Take 1 tablet (25 mg total) by mouth 2 (two) times daily.   MULTIVITAMIN & MINERAL  PO Take 1 tablet by mouth daily.   nitroGLYCERIN 0.4 MG SL tablet Commonly known as: NITROSTAT PLACE 1 TABLET (0.4 MG TOTAL) UNDER THE TONGUE EVERY 5 MINUTES AS NEEDED FOR CHEST PAIN.   pantoprazole 40 MG tablet Commonly known as: PROTONIX Take 1 tablet (40 mg total) by mouth daily.   polyethylene glycol 17 g packet Commonly known as: MiraLax Take 17 g by mouth daily.   ramipril 10 MG capsule Commonly known as: ALTACE Take 1 capsule (10 mg total) by mouth daily.        Allergies:  Allergies  Allergen Reactions   Ciprofloxacin Other (See Comments)    Jittery, uneasy feeling.   Doxycycline     Headaches    Flagyl [Metronidazole]     Family History: Family History  Problem Relation Age of Onset   Stroke Mother    Colon cancer Neg Hx    Esophageal cancer Neg Hx    Rectal cancer Neg Hx    Stomach cancer Neg Hx    Prostate cancer Neg Hx    Bladder Cancer Neg Hx    Kidney cancer Neg Hx     Social History:  reports that he has never smoked. He has never used smokeless tobacco. He reports current alcohol use. He reports that he does not use drugs.   Physical Exam: BP (!) 171/79   Pulse (!) 52   Ht '5\' 7"'$  (1.702 m)   Wt 193 lb (87.5 kg)   BMI 30.23 kg/m   Constitutional:  Alert and oriented, No acute distress. HEENT: [place] AT, moist mucus membranes.  Trachea midline, no masses. Cardiovascular: No clubbing, cyanosis, or edema. Respiratory: Normal respiratory effort, no increased work of breathing. Rectal: Normal sphincter tone,  30   CC prostate, diffusely firm  Skin: No rashes, bruises or suspicious lesions. Neurologic: Grossly intact, no focal deficits, moving all 4 extremities. Psychiatric: Normal mood and affect.  Laboratory Data:  Lab Results  Component Value Date   CREATININE 1.09 01/04/2022   No results found for: "HGBA1C"   Assessment & Plan:   Rising PSA - We reviewed the implications of an elevated PSA and the uncertainty surrounding it. In  general, a man's PSA increases with age and is produced by both normal and  cancerous prostate tissue. Differential for elevated PSA is BPH, prostate cancer, infection, recent intercourse/ejaculation, prostate infarction, recent urethroscopic manipulation (foley placement/cystoscopy) and prostatitis. Management of an elevated PSA can include observation or prostate biopsy and wediscussed this in detail. We discussed that indications for prostate biopsy are defined by age and race specific PSA cutoffs as well as a PSA velocity of 0.75/year. - PSA; pending  -Follow-up plan to be determined by repeat labs, if back to baseline, continue to follow with his PCP if it remains more elevated, may follow him on a q. versus 6-12 month interval in urology clinic  Return for will call with PSA results.  Conley Rolls as a Education administrator for Hollice Espy, MD.,have documented all relevant documentation on the behalf of Hollice Espy, MD,as directed by  Hollice Espy, MD while in the presence of Hollice Espy, MD.  I have reviewed the above documentation for accuracy and completeness, and I agree with the above.   Hollice Espy, MD    [facility] [address], [place], [place] [address] [phone]

## 2022-02-04 LAB — PSA: Prostate Specific Ag, Serum: 4.3 ng/mL — ABNORMAL HIGH (ref 0.0–4.0)

## 2022-02-07 ENCOUNTER — Telehealth: Payer: Self-pay

## 2022-02-07 DIAGNOSIS — R972 Elevated prostate specific antigen [PSA]: Secondary | ICD-10-CM

## 2022-02-07 NOTE — Telephone Encounter (Signed)
Left message for pt to return call.

## 2022-02-07 NOTE — Addendum Note (Signed)
Addended by: Alvera Novel on: 02/07/2022 04:23 PM   Modules accepted: Orders

## 2022-02-07 NOTE — Telephone Encounter (Signed)
Notified patient as instructed,  Patient would like to wait 3 month for psa, Scheduled appt.

## 2022-02-07 NOTE — Telephone Encounter (Signed)
-----   Message from Hollice Espy, MD sent at 02/07/2022 11:08 AM EDT ----- PSA continues to rise which is fairly concerning.  I think the best course of action would either to be repeat in 3 months and see if it staying on the same trend and then reassess.  Alternatively, we could proceed with biopsy however this would involve needing to stop aspirin and Plavix which has its own risks.  Lastly, we could get an MRI of the prostate to get a sense of what is going on although this is not always sensitive/specific.  We can meet again to discuss these options or we can just go ahead and proceed with one of them, I think each option is reasonable at this point.  Hollice Espy, MD

## 2022-03-08 ENCOUNTER — Ambulatory Visit (INDEPENDENT_AMBULATORY_CARE_PROVIDER_SITE_OTHER): Payer: Medicare HMO | Admitting: Family

## 2022-03-08 ENCOUNTER — Encounter: Payer: Self-pay | Admitting: Family

## 2022-03-08 VITALS — BP 126/74 | HR 67 | Temp 98.6°F | Resp 16 | Ht 67.0 in | Wt 188.1 lb

## 2022-03-08 DIAGNOSIS — J029 Acute pharyngitis, unspecified: Secondary | ICD-10-CM | POA: Diagnosis not present

## 2022-03-08 DIAGNOSIS — B370 Candidal stomatitis: Secondary | ICD-10-CM | POA: Insufficient documentation

## 2022-03-08 LAB — POCT RAPID STREP A (OFFICE): Rapid Strep A Screen: NEGATIVE

## 2022-03-08 MED ORDER — AMOXICILLIN-POT CLAVULANATE 875-125 MG PO TABS: 1.0000 | ORAL_TABLET | Freq: Two times a day (BID) | ORAL | 0 refills | Status: DC

## 2022-03-08 MED ORDER — CLOTRIMAZOLE 10 MG MT TROC: 10.0000 mg | Freq: Every day | OROMUCOSAL | 0 refills | Status: AC

## 2022-03-08 NOTE — Patient Instructions (Signed)
Recommend over the counter mucinex (guaifenesin)   Regards,   Shanquita Ronning FNP-C

## 2022-03-08 NOTE — Assessment & Plan Note (Signed)
Strep tested in office, negative Warm salt water gargles   

## 2022-03-08 NOTE — Progress Notes (Signed)
Established Patient Office Visit  Subjective:  Patient ID: Brian Todd., male    DOB: 1945-08-20  Age: 76 y.o. MRN: [id number]  CC:  Chief Complaint  Patient presents with  . Sore Throat    X  1 week Covid neg    HPI Brian KONOPKA Sr. is here today with concerns.   Sore throat without nasal congestion.  No ear pain.  Had headaches first part of the week. Tylenol for headache which does help.  No fever no chills.  Also with slight chest congestion, coughing up yellow sputum.  When lying down wheezing.   Covid tested a few days ago and was negative.   Otc, no meds taken. Not improving.   Past Medical History:  Diagnosis Date  . Colitis   . Coronary artery disease   . DJD (degenerative joint disease)   . Gallbladder disease   . GERD (gastroesophageal reflux disease)   . Hearing loss   . Heart murmur   . Hyperlipidemia   . Hypertension   . IBS (irritable bowel syndrome)   . Increased prostate specific antigen (PSA) velocity     Past Surgical History:  Procedure Laterality Date  . CARDIAC CATHETERIZATION    . CARDIAC CATHETERIZATION N/A 01/07/2015   Procedure: Left Heart Cath and Coronary Angiography;  Surgeon: Brian Hampshire, MD;  Location: [facility];  Service: Cardiovascular;  Laterality: N/A;  . CARDIAC CATHETERIZATION N/A 01/07/2015   Procedure: Coronary Stent Intervention;  Surgeon: Brian Hampshire, MD;  Location: [facility];  Service: Cardiovascular;  Laterality: N/A;  . COLONOSCOPY    . CORONARY STENT PLACEMENT     Dr. Olevia Todd  . CORONARY STENT PLACEMENT  01/07/2015   LAD  . TONSILLECTOMY AND ADENOIDECTOMY     in the 2nd grade    Family History  Problem Relation Age of Onset  . Stroke Mother   . Colon cancer Neg Hx   . Esophageal cancer Neg Hx   . Rectal cancer Neg Hx   . Stomach cancer Neg Hx   . Prostate cancer Neg Hx   . Bladder Cancer Neg Hx   . Kidney cancer Neg Hx     Social History   Socioeconomic History  .  Marital status: Married    Spouse name: Brian Todd  . Number of children: 2  . Years of education: Not on file  . Highest education level: Not on file  Occupational History  . Occupation: Engineer, production    Comment: Retired  Tobacco Use  . Smoking status: Never  . Smokeless tobacco: Never  Vaping Use  . Vaping Use: Never used  Substance and Sexual Activity  . Alcohol use: Yes    Alcohol/week: 0.0 standard drinks of alcohol    Comment: rarely drinks beer/approx. 1 case per year  . Drug use: No  . Sexual activity: Yes  Other Topics Concern  . Not on file  Social History Narrative  . Not on file   Social Determinants of Health   Financial Resource Strain: Low Risk  (01/20/2021)   Overall Financial Resource Strain (CARDIA)   . Difficulty of Paying Living Expenses: Not hard at all  Food Insecurity: No Food Insecurity (01/20/2021)   Hunger Vital Sign   . Worried About Charity fundraiser in the Last Year: Never true   . Ran Out of Food in the Last Year: Never true  Transportation Needs: No Transportation Needs (01/20/2021)   PRAPARE -  Transportation   . Lack of Transportation (Medical): No   . Lack of Transportation (Non-Medical): No  Physical Activity: Inactive (01/20/2021)   Exercise Vital Sign   . Days of Exercise per Week: 0 days   . Minutes of Exercise per Session: 0 min  Stress: No Stress Concern Present (01/20/2021)   [place]   . Feeling of Stress : Not at all  Social Connections: Not on file  Intimate Partner Violence: Not At Risk (01/20/2021)   Humiliation, Afraid, Rape, and Kick questionnaire   . Fear of Current or Ex-Partner: No   . Emotionally Abused: No   . Physically Abused: No   . Sexually Abused: No    Outpatient Medications Prior to Visit  Medication Sig Dispense Refill  . aspirin EC 81 MG tablet Take 1 tablet (81 mg total) by mouth daily.    Marland Kitchen atorvastatin (LIPITOR) 40 MG tablet Take  1 tablet (40 mg total) by mouth daily. 90 tablet 3  . clopidogrel (PLAVIX) 75 MG tablet Take 1 tablet (75 mg total) by mouth daily. 90 tablet 2  . metoprolol tartrate (LOPRESSOR) 25 MG tablet Take 1 tablet (25 mg total) by mouth 2 (two) times daily. 180 tablet 2  . Multiple Vitamins-Minerals (MULTIVITAMIN & MINERAL PO) Take 1 tablet by mouth daily.    . nitroGLYCERIN (NITROSTAT) 0.4 MG SL tablet PLACE 1 TABLET (0.4 MG TOTAL) UNDER THE TONGUE EVERY 5 MINUTES AS NEEDED FOR CHEST PAIN. 25 tablet 1  . pantoprazole (PROTONIX) 40 MG tablet Take 1 tablet (40 mg total) by mouth daily. 90 tablet 3  . polyethylene glycol (MIRALAX) packet Take 17 g by mouth daily. 14 each 0  . ramipril (ALTACE) 10 MG capsule Take 1 capsule (10 mg total) by mouth daily. 90 capsule 3   No facility-administered medications prior to visit.    Allergies  Allergen Reactions  . Ciprofloxacin Other (See Comments)    Jittery, uneasy feeling.  . Doxycycline     Headaches   . Flagyl [Metronidazole]         Objective:      Physical Exam Constitutional:      General: He is awake. He is not in acute distress.    Appearance: Normal appearance. He is well-developed and normal weight. He is not ill-appearing, toxic-appearing or diaphoretic.  HENT:     Right Ear: Tympanic membrane normal.     Left Ear: Tympanic membrane normal.     Nose: Nose normal.     Right Turbinates: Not enlarged or swollen.     Left Turbinates: Not enlarged or swollen.     Right Sinus: No maxillary sinus tenderness or frontal sinus tenderness.     Left Sinus: No maxillary sinus tenderness or frontal sinus tenderness.     Mouth/Throat:     Mouth: Mucous membranes are moist.     Tongue: Lesions (patch of white budding with base of erythema) present.     Pharynx: No pharyngeal swelling, oropharyngeal exudate or posterior oropharyngeal erythema.  Eyes:     Extraocular Movements: Extraocular movements intact.     Pupils: Pupils are equal, round,  and reactive to light.  Cardiovascular:     Rate and Rhythm: Normal rate and regular rhythm.  Pulmonary:     Effort: Pulmonary effort is normal.     Breath sounds: Normal breath sounds. No wheezing.  Neurological:     Mental Status: He is alert.    BP 126/74  Pulse 67   Temp 98.6 F (37 C)   Resp 16   Ht '5\' 7"'$  (1.702 m)   Wt 188 lb 2 oz (85.3 kg)   SpO2 98%   BMI 29.46 kg/m  Wt Readings from Last 3 Encounters:  03/08/22 188 lb 2 oz (85.3 kg)  02/03/22 193 lb (87.5 kg)  01/13/22 195 lb 1.6 oz (88.5 kg)     Health Maintenance Due  Topic Date Due  . COVID-19 Vaccine (5 - Pfizer series) 08/22/2021  . Zoster Vaccines- Shingrix (2 of 2) 02/17/2022  . INFLUENZA VACCINE  02/22/2022    There are no preventive care reminders to display for this patient.  Lab Results  Component Value Date   TSH 2.52 01/04/2022   Lab Results  Component Value Date   WBC 6.4 01/04/2022   HGB 14.7 01/04/2022   HCT 44.8 01/04/2022   MCV 93.4 01/04/2022   PLT 196.0 01/04/2022   Lab Results  Component Value Date   NA 138 01/04/2022   K 4.7 01/04/2022   CO2 31 01/04/2022   GLUCOSE 89 01/04/2022   BUN 20 01/04/2022   CREATININE 1.09 01/04/2022   BILITOT 1.0 01/04/2022   ALKPHOS 100 01/04/2022   AST 19 01/04/2022   ALT 19 01/04/2022   PROT 6.7 01/04/2022   ALBUMIN 4.1 01/04/2022   CALCIUM 9.8 01/04/2022   ANIONGAP 7 09/27/2018   GFR 67.00 01/04/2022   No results found for: "HGBA1C"    Assessment & Plan:   Problem List Items Addressed This Visit       Respiratory   Acute pharyngitis    Take antibiotic as prescribed. Increase oral fluids. Pt to f/u if sx worsen and or fail to improve in 2-3 days. rx augmentin 875/125 mg po bid x 10 days Recommend otc mucinex      Relevant Medications   amoxicillin-clavulanate (AUGMENTIN) 875-125 MG tablet     Digestive   Candida, oral    rx clotrimazole troche       Relevant Medications   amoxicillin-clavulanate (AUGMENTIN) 875-125  MG tablet   clotrimazole (MYCELEX) 10 MG troche     Other   Sore throat - Primary    Strep tested in office, negative Warm salt water gargles        Relevant Medications   amoxicillin-clavulanate (AUGMENTIN) 875-125 MG tablet   Other Relevant Orders   POCT rapid strep A (Completed)    Meds ordered this encounter  Medications  . amoxicillin-clavulanate (AUGMENTIN) 875-125 MG tablet    Sig: Take 1 tablet by mouth 2 (two) times daily.    Dispense:  20 tablet    Refill:  0    Order Specific Question:   Supervising Provider    Answer:   BEDSOLE, AMY E [[id number]]  . clotrimazole (MYCELEX) 10 MG troche    Sig: Take 1 tablet (10 mg total) by mouth 5 (five) times daily for 7 days. Slowly dissolve in mouth, do not chew lozenge or swallow lozenge whole    Dispense:  35 Troche    Refill:  0    Order Specific Question:   Supervising Provider    Answer:   BEDSOLE, AMY E [[id number]]    Follow-up: Return if symptoms worsen or fail to improve with pcp.    Eugenia Pancoast, FNP

## 2022-03-08 NOTE — Assessment & Plan Note (Signed)
rx clotrimazole troche

## 2022-03-08 NOTE — Assessment & Plan Note (Signed)
Take antibiotic as prescribed. Increase oral fluids. Pt to f/u if sx worsen and or fail to improve in 2-3 days. rx augmentin 875/125 mg po bid x 10 days Recommend otc mucinex

## 2022-04-28 ENCOUNTER — Encounter: Payer: Self-pay | Admitting: Cardiovascular Disease

## 2022-04-28 ENCOUNTER — Ambulatory Visit: Payer: Medicare HMO | Attending: Cardiovascular Disease | Admitting: Cardiovascular Disease

## 2022-04-28 VITALS — BP 140/60 | HR 53 | Ht 67.0 in | Wt 193.1 lb

## 2022-04-28 DIAGNOSIS — E785 Hyperlipidemia, unspecified: Secondary | ICD-10-CM

## 2022-04-28 DIAGNOSIS — I10 Essential (primary) hypertension: Secondary | ICD-10-CM | POA: Diagnosis not present

## 2022-04-28 DIAGNOSIS — I2510 Atherosclerotic heart disease of native coronary artery without angina pectoris: Secondary | ICD-10-CM | POA: Diagnosis not present

## 2022-04-28 MED ORDER — CARVEDILOL 6.25 MG PO TABS: 6.2500 mg | ORAL_TABLET | Freq: Two times a day (BID) | ORAL | 3 refills | Status: DC

## 2022-04-28 NOTE — Progress Notes (Signed)
Cardiology Office Note   Date:  04/28/2022   ID:  Brian Glance Sr., DOB 12/25/1945, MRN [id number]  PCP:  Abner Greenspan, MD  Cardiologist:   Kathlyn Sacramento, MD   Chief Complaint  Patient presents with   Other    12 Month f/u no complaints today. Meds reviewed verbally with pt.      History of Present Illness: Brian Todd. is a 76 y.o. male who presents for a follow-up visit  regarding coronary artery disease. He had a small non-ST elevation myocardial infarction in 2001. Cardiac catheterization showed a 95% mid LAD stenosis and 40% stenosis in OM branch. He had successful angioplasty and Taxus drug-eluting stent placement to the mid LAD without complications.  He had recurrent angina in June 2016. Cardiac catheterization showed 99% in-stent restenosis in the mid LAD extending to the distal edge, 60% mid left circumflex stenosis and mild RCA disease. I performed successful angioplasty and drug-eluting stent placement to the mid LAD.  He has other chronic medical conditions that include hypertension, hyperlipidemia and gastroesophageal reflux disease.  He has been doing well overall with no chest pain, shortness of breath or palpitations.  He continues to be active and does his yard work with no issues.  He does report intermittent episodes of dizziness but no syncope or presyncope.  He has mildly elevated PSA and follows with urology.   Past Medical History:  Diagnosis Date   Colitis    Coronary artery disease    DJD (degenerative joint disease)    Gallbladder disease    GERD (gastroesophageal reflux disease)    Hearing loss    Heart murmur    Hyperlipidemia    Hypertension    IBS (irritable bowel syndrome)    Increased prostate specific antigen (PSA) velocity     Past Surgical History:  Procedure Laterality Date   CARDIAC CATHETERIZATION     CARDIAC CATHETERIZATION N/A 01/07/2015   Procedure: Left Heart Cath and Coronary Angiography;  Surgeon: [name], MD;  Location: [facility];  Service: Cardiovascular;  Laterality: N/A;   CARDIAC CATHETERIZATION N/A 01/07/2015   Procedure: Coronary Stent Intervention;  Surgeon: [name], MD;  Location: [facility];  Service: Cardiovascular;  Laterality: N/A;   COLONOSCOPY     CORONARY STENT PLACEMENT     Dr. [name]   CORONARY [place]  01/07/2015   LAD   TONSILLECTOMY AND ADENOIDECTOMY     in the 2nd grade     Current Outpatient Medications  Medication Sig Dispense Refill   aspirin EC 81 MG tablet Take 1 tablet (81 mg total) by mouth daily.     atorvastatin (LIPITOR) 40 MG tablet Take 1 tablet (40 mg total) by mouth daily. 90 tablet 3   carvedilol (COREG) 6.25 MG tablet Take 1 tablet (6.25 mg total) by mouth 2 (two) times daily. 180 tablet 3   clopidogrel (PLAVIX) 75 MG tablet Take 1 tablet (75 mg total) by mouth daily. 90 tablet 2   Multiple Vitamins-Minerals (MULTIVITAMIN & MINERAL PO) Take 1 tablet by mouth daily.     nitroGLYCERIN (NITROSTAT) 0.4 MG SL tablet PLACE 1 TABLET (0.4 MG TOTAL) UNDER THE TONGUE EVERY 5 MINUTES AS NEEDED FOR CHEST PAIN. 25 tablet 1   pantoprazole (PROTONIX) 40 MG tablet Take 1 tablet (40 mg total) by mouth daily. 90 tablet 3   polyethylene glycol (MIRALAX) packet Take 17 g by mouth daily. 14 each 0  ramipril (ALTACE) 10 MG capsule Take 1 capsule (10 mg total) by mouth daily. 90 capsule 3   No current facility-administered medications for this visit.    Allergies:   Ciprofloxacin, Doxycycline, and Flagyl [metronidazole]    Social History:  The patient  reports that he has never smoked. He has never used smokeless tobacco. He reports current alcohol use. He reports that he does not use drugs.   Family History:  The patient's family history includes Stroke in his mother.    ROS:  Please see the history of present illness.   Otherwise, review of systems are positive for none.   All other systems are reviewed and negative.     PHYSICAL EXAM: VS:  BP (!) 140/60 (BP Location: Left Arm, Patient Position: Sitting, Cuff Size: Normal)   Pulse (!) 53   Ht '5\' 7"'$  (1.702 m)   Wt 193 lb 2 oz (87.6 kg)   SpO2 99%   BMI 30.25 kg/m  , BMI Body mass index is 30.25 kg/m. GEN: Well nourished, well developed, in no acute distress  HEENT: normal  Neck: no JVD, carotid bruits, or masses Cardiac: RRR; no murmurs, rubs, or gallops,no edema  Respiratory:  clear to auscultation bilaterally, normal work of breathing GI: soft, nontender, nondistended, + BS MS: no deformity or atrophy  Skin: warm and dry, no rash Neuro:  Strength and sensation are intact Psych: euthymic mood, full affect   EKG:  EKG is ordered today. The ekg ordered today demonstrates sinus bradycardia with nonspecific ST changes.  Heart rate is 53 bpm.   Recent Labs: 01/04/2022: ALT 19; BUN 20; Creatinine, Ser 1.09; Hemoglobin 14.7; Platelets 196.0; Potassium 4.7; Sodium 138; TSH 2.52    Lipid Panel    Component Value Date/Time   CHOL 122 01/04/2022 0829   TRIG 134.0 01/04/2022 0829   HDL 44.60 01/04/2022 0829   CHOLHDL 3 01/04/2022 0829   VLDL 26.8 01/04/2022 0829   LDLCALC 51 01/04/2022 0829   LDLDIRECT 76.5 09/02/2010 1036      Wt Readings from Last 3 Encounters:  04/28/22 193 lb 2 oz (87.6 kg)  03/08/22 188 lb 2 oz (85.3 kg)  02/03/22 193 lb (87.5 kg)        ASSESSMENT AND PLAN:  1.  Coronary artery disease involving native coronary arteries without angina: The patient overall is doing well from a cardiac standpoint. Given that he has a stent within a stent and also has a previous Taxus drug-eluting stent, I am planning to keep him on  dual antiplatelet therapy indefinitely as tolerated.    2. Hyperlipidemia: Continue atorvastatin 40 mg once daily.  I reviewed his most recent lipid profile done in June which showed an LDL of 51.  3. Essential hypertension: His blood pressure is borderline elevated and he continues to be mildly  bradycardic.  I elected to switch metoprolol to carvedilol 6.25 mg twice daily.  4.  Elevated PSA: Follows with urology.  If biopsy is needed, Plavix can be held 5 days before.   Disposition:   FU with me in 1 year  Signed,  Kathlyn Sacramento, MD  04/28/2022 8:56 AM    [place]

## 2022-04-28 NOTE — Patient Instructions (Signed)
Medication Instructions:  Your physician has recommended you make the following change in your medication:   STOP Metoprolol  START Carvedilol 6.25 mg twice a day. An Rx has been sent to your pharmacy  *If you need a refill on your cardiac medications before your next appointment, please call your pharmacy*   Lab Work: None ordered If you have labs (blood work) drawn today and your tests are completely normal, you will receive your results only by: [place] (if you have MyChart) OR A paper copy in the mail If you have any lab test that is abnormal or we need to change your treatment, we will call you to review the results.   Testing/Procedures: None ordered   Follow-Up: At [facility], you and your health needs are our priority.  As part of our continuing mission to provide you with exceptional heart care, we have created designated Provider Care Teams.  These Care Teams include your primary Cardiologist (physician) and Advanced Practice Providers (APPs -  Physician Assistants and Nurse Practitioners) who all work together to provide you with the care you need, when you need it.  We recommend signing up for the patient portal called "MyChart".  Sign up information is provided on this After Visit Summary.  MyChart is used to connect with patients for Virtual Visits (Telemedicine).  Patients are able to view lab/test results, encounter notes, upcoming appointments, etc.  Non-urgent messages can be sent to your provider as well.   To learn more about what you can do with MyChart, go to [email or web address].    Your next appointment:   Your physician wants you to follow-up in: 1 year You will receive a reminder letter in the mail two months in advance. If you don't receive a letter, please call our office to schedule the follow-up appointment.   The format for your next appointment:   In Person  Provider:   You may see Kathlyn Sacramento, MD or one of the following  Advanced Practice Providers on your designated Care Team:   Murray Hodgkins, NP Christell Faith, PA-C Cadence Kathlen Mody, PA-C Gerrie Nordmann, NP  Other Instructions N/A  Important Information About Sugar

## 2022-05-02 ENCOUNTER — Other Ambulatory Visit: Payer: Self-pay | Admitting: Cardiovascular Disease

## 2022-05-02 NOTE — Telephone Encounter (Signed)
Rx refill sent to pharmacy. 

## 2022-05-04 ENCOUNTER — Telehealth: Payer: Self-pay | Admitting: Family Medicine

## 2022-05-04 NOTE — Telephone Encounter (Signed)
[place] or [place]?  For what dx?  Will depend on who takes his insurance also  I won't be able to respond till Monday likely

## 2022-05-04 NOTE — Telephone Encounter (Signed)
Patient called in and would like to know if there are any good ear doctors that Dr Glori Bickers could recommend for him? Please advise.

## 2022-05-06 ENCOUNTER — Other Ambulatory Visit: Payer: Self-pay | Admitting: Family

## 2022-05-06 DIAGNOSIS — H6123 Impacted cerumen, bilateral: Secondary | ICD-10-CM

## 2022-05-06 NOTE — Telephone Encounter (Signed)
Pt said his ears gets stopped up frequently. He sad that his last ENT put tubes in his ears but it didn't help much, pt said his right ear is stopped up but his prev ENT retired so he needs a new one. Pt said he can go to [facility], Either one is fine.

## 2022-05-09 ENCOUNTER — Encounter: Payer: Self-pay | Admitting: Family Medicine

## 2022-05-10 ENCOUNTER — Ambulatory Visit (INDEPENDENT_AMBULATORY_CARE_PROVIDER_SITE_OTHER): Payer: Medicare HMO | Admitting: Family Medicine

## 2022-05-10 ENCOUNTER — Other Ambulatory Visit: Payer: Medicare HMO

## 2022-05-10 ENCOUNTER — Encounter: Payer: Self-pay | Admitting: Family Medicine

## 2022-05-10 VITALS — BP 130/60 | HR 56 | Temp 98.1°F | Ht 67.0 in | Wt 195.0 lb

## 2022-05-10 DIAGNOSIS — R972 Elevated prostate specific antigen [PSA]: Secondary | ICD-10-CM

## 2022-05-10 DIAGNOSIS — H6991 Unspecified Eustachian tube disorder, right ear: Secondary | ICD-10-CM

## 2022-05-10 DIAGNOSIS — H903 Sensorineural hearing loss, bilateral: Secondary | ICD-10-CM | POA: Diagnosis not present

## 2022-05-10 DIAGNOSIS — I10 Essential (primary) hypertension: Secondary | ICD-10-CM | POA: Diagnosis not present

## 2022-05-10 NOTE — Assessment & Plan Note (Signed)
This is worse again in R ear  H/o myringotomy tube in the past  No cerumen today   Plan to try daily flonase 2 sp each nostil If not improved in 1-2 wk consider prednisone and ENT f/u (needs to get est with new ENT anyway and ref was done)  This is worsening his age related hearing loss

## 2022-05-10 NOTE — Progress Notes (Signed)
Subjective:    Patient ID: Brian Todd., male    DOB: 08-Jan-1946, 76 y.o.   MRN: [id number]  HPI Pt presents for ear fullness and discomfort   Wt Readings from Last 3 Encounters:  05/10/22 195 lb (88.5 kg)  04/28/22 193 lb 2 oz (87.6 kg)  03/08/22 188 lb 2 oz (85.3 kg)   30.54 kg/m   H/o hearing loss   Referral was done to ENT but that practice did not take his insurance   Has ear blockage  Worse on the R ? Cerumen  Used to see Dr Thornell Mule in the past- thinks he stopped seeing patients  Has not found another doctor yet   Does not wear hearing aides  Not ready yet  No tubes in his ears right now  Had a tube once and it did not help   No holes in ear drums  No tubes now   At home uses baby oil  Then used a syringe with sterilized water  He got some out of the R ear  Has used peroxide also   Has allergy symptoms  Some sneezing  Some congestion and pnd  Taking mucinex   HTN BP Readings from Last 3 Encounters:  05/10/22 130/60  04/28/22 (!) 140/60  03/08/22 126/74    Metoprolol 25 mg bid Ramipril 10 mg daily   His prior ENT referral did not work out   Patient Active Problem List   Diagnosis Date Noted   Candida, oral 03/08/2022   Sore throat 03/08/2022   Tinnitus of right ear 10/08/2021   Hearing loss sensory, bilateral 10/07/2021   Seasonal allergic rhinitis due to pollen 10/07/2021   Aortic atherosclerosis ([place]) 09/30/2018   ETD (eustachian tube dysfunction) 10/05/2016   Coronary artery disease involving native coronary artery without angina pectoris 04/09/2015   History of heart artery stent 01/01/2015   History of colitis 11/08/2014   DEGENERATIVE JOINT DISEASE 07/30/2009   Elevated PSA 07/30/2009   HEARING LOSS, MILD 11/27/2007   IRRITABLE BOWEL SYNDROME 11/27/2007   Hyperlipidemia 08/13/2007   Essential hypertension 08/13/2007   GERD 08/13/2007   Past Medical History:  Diagnosis Date   Colitis    Coronary artery disease    DJD  (degenerative joint disease)    Gallbladder disease    GERD (gastroesophageal reflux disease)    Hearing loss    Heart murmur    Hyperlipidemia    Hypertension    IBS (irritable bowel syndrome)    Increased prostate specific antigen (PSA) velocity    Past Surgical History:  Procedure Laterality Date   CARDIAC CATHETERIZATION     CARDIAC CATHETERIZATION N/A 01/07/2015   Procedure: Left Heart Cath and Coronary Angiography;  Surgeon: [name], MD;  Location: [facility];  Service: Cardiovascular;  Laterality: N/A;   CARDIAC CATHETERIZATION N/A 01/07/2015   Procedure: Coronary Stent Intervention;  Surgeon: [name], MD;  Location: [facility];  Service: Cardiovascular;  Laterality: N/A;   COLONOSCOPY     CORONARY STENT PLACEMENT     Dr. [name]   CORONARY STENT PLACEMENT  01/07/2015   LAD   TONSILLECTOMY AND ADENOIDECTOMY     in the 2nd grade   Social History   Tobacco Use   Smoking status: Never   Smokeless tobacco: Never  Vaping Use   Vaping Use: Never used  Substance Use Topics   Alcohol use: Yes    Alcohol/week: 0.0 standard drinks of alcohol  Comment: rarely drinks beer/approx. 1 case per year   Drug use: No   Family History  Problem Relation Age of Onset   Stroke Mother    Colon cancer Neg Hx    Esophageal cancer Neg Hx    Rectal cancer Neg Hx    Stomach cancer Neg Hx    Prostate cancer Neg Hx    Bladder Cancer Neg Hx    Kidney cancer Neg Hx    Allergies  Allergen Reactions   Ciprofloxacin Other (See Comments)    Jittery, uneasy feeling.   Doxycycline     Headaches    Flagyl [Metronidazole]    Current Outpatient Medications on File Prior to Visit  Medication Sig Dispense Refill   aspirin EC 81 MG tablet Take 1 tablet (81 mg total) by mouth daily.     atorvastatin (LIPITOR) 40 MG tablet Take 1 tablet (40 mg total) by mouth daily. 90 tablet 3   carvedilol (COREG) 6.25 MG tablet Take 1 tablet (6.25 mg total) by mouth 2 (two)  times daily. 180 tablet 3   clopidogrel (PLAVIX) 75 MG tablet TAKE 1 TABLET BY MOUTH EVERY DAY 90 tablet 3   fluticasone (FLONASE) 50 MCG/ACT nasal spray Place 2 sprays into both nostrils daily.     Multiple Vitamins-Minerals (MULTIVITAMIN & MINERAL PO) Take 1 tablet by mouth daily.     nitroGLYCERIN (NITROSTAT) 0.4 MG SL tablet PLACE 1 TABLET (0.4 MG TOTAL) UNDER THE TONGUE EVERY 5 MINUTES AS NEEDED FOR CHEST PAIN. 25 tablet 1   pantoprazole (PROTONIX) 40 MG tablet Take 1 tablet (40 mg total) by mouth daily. 90 tablet 3   polyethylene glycol (MIRALAX) packet Take 17 g by mouth daily. 14 each 0   ramipril (ALTACE) 10 MG capsule Take 1 capsule (10 mg total) by mouth daily. 90 capsule 3   No current facility-administered medications on file prior to visit.    Review of Systems  Constitutional:  Negative for activity change, appetite change, fatigue, fever and unexpected weight change.  HENT:  Positive for ear pain, hearing loss, postnasal drip, rhinorrhea and sneezing. Negative for congestion, ear discharge, facial swelling, sinus pressure, sinus pain, sore throat, trouble swallowing and voice change.   Eyes:  Negative for pain, redness, itching and visual disturbance.  Respiratory:  Negative for cough, chest tightness, shortness of breath and wheezing.   Cardiovascular:  Negative for chest pain and palpitations.  Gastrointestinal:  Negative for abdominal pain, blood in stool, constipation, diarrhea and nausea.  Endocrine: Negative for cold intolerance, heat intolerance, polydipsia and polyuria.  Genitourinary:  Negative for difficulty urinating, dysuria, frequency and urgency.  Musculoskeletal:  Negative for arthralgias, joint swelling and myalgias.  Skin:  Negative for pallor and rash.  Neurological:  Negative for dizziness, tremors, weakness, light-headedness, numbness and headaches.  Hematological:  Negative for adenopathy. Does not bruise/bleed easily.  Psychiatric/Behavioral:  Negative  for decreased concentration and dysphoric mood. The patient is not nervous/anxious.        Objective:   Physical Exam Constitutional:      General: He is not in acute distress.    Appearance: Normal appearance. He is obese. He is not ill-appearing or diaphoretic.  HENT:     Head: Normocephalic and atraumatic.     Right Ear: External ear normal. There is no impacted cerumen.     Left Ear: Tympanic membrane, ear canal and external ear normal. There is no impacted cerumen.     Ears:     Comments: Small  effusion R TM / dullness as well  L TM appears nl  Some reduced sens to soft sound   No cerumen  Canals appear nl    Nose:     Comments: Boggy nares     Mouth/Throat:     Mouth: Mucous membranes are moist.     Pharynx: Oropharynx is clear. No posterior oropharyngeal erythema.  Eyes:     General:        Right eye: No discharge.        Left eye: No discharge.     Conjunctiva/sclera: Conjunctivae normal.     Pupils: Pupils are equal, round, and reactive to light.  Cardiovascular:     Rate and Rhythm: Regular rhythm. Bradycardia present.     Heart sounds: Normal heart sounds.  Pulmonary:     Effort: Pulmonary effort is normal. No respiratory distress.     Breath sounds: Normal breath sounds. No wheezing or rales.  Lymphadenopathy:     Cervical: No cervical adenopathy.  Skin:    Findings: No rash.  Neurological:     Mental Status: He is alert.     Cranial Nerves: No cranial nerve deficit.  Psychiatric:        Mood and Affect: Mood normal.           Assessment & Plan:

## 2022-05-10 NOTE — Assessment & Plan Note (Addendum)
No cerumen today / was able to clear it at home  Some issues with ETD-will tx with flonase  Ref to ENT (his prev provider is no longer seeing pts)   Reviewed last notes from Dr Thornell Mule in epic as well as hearing tests  Referral done

## 2022-05-10 NOTE — Patient Instructions (Addendum)
I think you have eustachian tube dysfunction  Use your flonase 2 sprays in each nostril once daily  It will take a week or more to work  I hope this helps open up your ear tubes   If this does not help in 2 weeks please let us know and you may need to try prednisone or see an ear doctor at that point   I placed a referral to new ENT and noted you need someone who takes your insurance

## 2022-05-10 NOTE — Assessment & Plan Note (Signed)
Stable bp today  BP: 130/60   Coreg 6.25 mg bid  altace 10 mg daily

## 2022-05-10 NOTE — Telephone Encounter (Signed)
error 

## 2022-05-11 LAB — PSA: Prostate Specific Ag, Serum: 3.6 ng/mL (ref 0.0–4.0)

## 2022-05-13 ENCOUNTER — Telehealth: Payer: Self-pay | Admitting: Family Medicine

## 2022-05-13 NOTE — Telephone Encounter (Signed)
Left message for patient to call back and schedule Medicare Annual Wellness Visit (AWV) either virtually or phone   Left  my jabber number [phone]   Last AWV 01/20/21     45 min for awv-i and in office appointments 30 min for awv-s  phone/virtual appointments

## 2022-05-25 ENCOUNTER — Ambulatory Visit: Payer: Medicare HMO | Admitting: Family Medicine

## 2022-05-26 DIAGNOSIS — H903 Sensorineural hearing loss, bilateral: Secondary | ICD-10-CM | POA: Diagnosis not present

## 2022-05-26 DIAGNOSIS — H6591 Unspecified nonsuppurative otitis media, right ear: Secondary | ICD-10-CM | POA: Diagnosis not present

## 2022-05-26 DIAGNOSIS — Z881 Allergy status to other antibiotic agents status: Secondary | ICD-10-CM | POA: Diagnosis not present

## 2022-05-26 DIAGNOSIS — H938X1 Other specified disorders of right ear: Secondary | ICD-10-CM | POA: Diagnosis not present

## 2022-06-01 ENCOUNTER — Ambulatory Visit (INDEPENDENT_AMBULATORY_CARE_PROVIDER_SITE_OTHER): Payer: Medicare HMO

## 2022-06-01 VITALS — Ht 67.0 in | Wt 195.0 lb

## 2022-06-01 DIAGNOSIS — Z0000 Encounter for general adult medical examination without abnormal findings: Secondary | ICD-10-CM

## 2022-06-01 NOTE — Patient Instructions (Signed)
Brian Todd , Thank you for taking time to come for your Medicare Wellness Visit. I appreciate your ongoing commitment to your health goals. Please review the following plan we discussed and let me know if I can assist you in the future.   Screening recommendations/referrals: Colonoscopy: 11/29/18 Recommended yearly ophthalmology/optometry visit for glaucoma screening and checkup Recommended yearly dental visit for hygiene and checkup  Vaccinations: Influenza vaccine: n/d Pneumococcal vaccine: 10/31/14 Tdap vaccine: 09/02/10, due if have injury Shingles vaccine: Shingrix 12/23/21, 03/23/22   Covid-19: 09/09/19, 10/09/19, 05/21/20, 06/27/21, 04/26/22  Advanced directives: no  Conditions/risks identified: none  Next appointment: Follow up in one year for your annual wellness visit. 06/05/23 @ 1:45pm by phone  Preventive Care 65 Years and Older, Male Preventive care refers to lifestyle choices and visits with your health care provider that can promote health and wellness. What does preventive care include? A yearly physical exam. This is also called an annual well check. Dental exams once or twice a year. Routine eye exams. Ask your health care provider how often you should have your eyes checked. Personal lifestyle choices, including: Daily care of your teeth and gums. Regular physical activity. Eating a healthy diet. Avoiding tobacco and drug use. Limiting alcohol use. Practicing safe sex. Taking low doses of aspirin every day. Taking vitamin and mineral supplements as recommended by your health care provider. What happens during an annual well check? The services and screenings done by your health care provider during your annual well check will depend on your age, overall health, lifestyle risk factors, and family history of disease. Counseling  Your health care provider may ask you questions about your: Alcohol use. Tobacco use. Drug use. Emotional well-being. Home and relationship  well-being. Sexual activity. Eating habits. History of falls. Memory and ability to understand (cognition). Work and work Statistician. Screening  You may have the following tests or measurements: Height, weight, and BMI. Blood pressure. Lipid and cholesterol levels. These may be checked every 5 years, or more frequently if you are over 71 years old. Skin check. Lung cancer screening. You may have this screening every year starting at age 74 if you have a 30-pack-year history of smoking and currently smoke or have quit within the past 15 years. Fecal occult blood test (FOBT) of the stool. You may have this test every year starting at age 74. Flexible sigmoidoscopy or colonoscopy. You may have a sigmoidoscopy every 5 years or a colonoscopy every 10 years starting at age 74. Prostate cancer screening. Recommendations will vary depending on your family history and other risks. Hepatitis C blood test. Hepatitis B blood test. Sexually transmitted disease (STD) testing. Diabetes screening. This is done by checking your blood sugar (glucose) after you have not eaten for a while (fasting). You may have this done every 1-3 years. Abdominal aortic aneurysm (AAA) screening. You may need this if you are a current or former smoker. Osteoporosis. You may be screened starting at age 74 if you are at high risk. Talk with your health care provider about your test results, treatment options, and if necessary, the need for more tests. Vaccines  Your health care provider may recommend certain vaccines, such as: Influenza vaccine. This is recommended every year. Tetanus, diphtheria, and acellular pertussis (Tdap, Td) vaccine. You may need a Td booster every 10 years. Zoster vaccine. You may need this after age 7. Pneumococcal 13-valent conjugate (PCV13) vaccine. One dose is recommended after age 74. Pneumococcal polysaccharide (PPSV23) vaccine. One dose is recommended after  age 45. Talk to your health care  provider about which screenings and vaccines you need and how often you need them. This information is not intended to replace advice given to you by your health care provider. Make sure you discuss any questions you have with your health care provider. Document Released: 08/07/2015 Document Revised: 03/30/2016 Document Reviewed: 05/12/2015 Elsevier Interactive Patient Education  2017 [place] Prevention in the Home Falls can cause injuries. They can happen to people of all ages. There are many things you can do to make your home safe and to help prevent falls. What can I do on the outside of my home? Regularly fix the edges of walkways and driveways and fix any cracks. Remove anything that might make you trip as you walk through a door, such as a raised step or threshold. Trim any bushes or trees on the path to your home. Use bright outdoor lighting. Clear any walking paths of anything that might make someone trip, such as rocks or tools. Regularly check to see if handrails are loose or broken. Make sure that both sides of any steps have handrails. Any raised decks and porches should have guardrails on the edges. Have any leaves, snow, or ice cleared regularly. Use sand or salt on walking paths during winter. Clean up any spills in your garage right away. This includes oil or grease spills. What can I do in the bathroom? Use night lights. Install grab bars by the toilet and in the tub and shower. Do not use towel bars as grab bars. Use non-skid mats or decals in the tub or shower. If you need to sit down in the shower, use a plastic, non-slip stool. Keep the floor dry. Clean up any water that spills on the floor as soon as it happens. Remove soap buildup in the tub or shower regularly. Attach bath mats securely with double-sided non-slip rug tape. Do not have throw rugs and other things on the floor that can make you trip. What can I do in the bedroom? Use night lights. Make  sure that you have a light by your bed that is easy to reach. Do not use any sheets or blankets that are too big for your bed. They should not hang down onto the floor. Have a firm chair that has side arms. You can use this for support while you get dressed. Do not have throw rugs and other things on the floor that can make you trip. What can I do in the kitchen? Clean up any spills right away. Avoid walking on wet floors. Keep items that you use a lot in easy-to-reach places. If you need to reach something above you, use a strong step stool that has a grab bar. Keep electrical cords out of the way. Do not use floor polish or wax that makes floors slippery. If you must use wax, use non-skid floor wax. Do not have throw rugs and other things on the floor that can make you trip. What can I do with my stairs? Do not leave any items on the stairs. Make sure that there are handrails on both sides of the stairs and use them. Fix handrails that are broken or loose. Make sure that handrails are as long as the stairways. Check any carpeting to make sure that it is firmly attached to the stairs. Fix any carpet that is loose or worn. Avoid having throw rugs at the top or bottom of the stairs. If you do  have throw rugs, attach them to the floor with carpet tape. Make sure that you have a light switch at the top of the stairs and the bottom of the stairs. If you do not have them, ask someone to add them for you. What else can I do to help prevent falls? Wear shoes that: Do not have high heels. Have rubber bottoms. Are comfortable and fit you well. Are closed at the toe. Do not wear sandals. If you use a stepladder: Make sure that it is fully opened. Do not climb a closed stepladder. Make sure that both sides of the stepladder are locked into place. Ask someone to hold it for you, if possible. Clearly mark and make sure that you can see: Any grab bars or handrails. First and last steps. Where the  edge of each step is. Use tools that help you move around (mobility aids) if they are needed. These include: Canes. Walkers. Scooters. Crutches. Turn on the lights when you go into a dark area. Replace any light bulbs as soon as they burn out. Set up your furniture so you have a clear path. Avoid moving your furniture around. If any of your floors are uneven, fix them. If there are any pets around you, be aware of where they are. Review your medicines with your doctor. Some medicines can make you feel dizzy. This can increase your chance of falling. Ask your doctor what other things that you can do to help prevent falls. This information is not intended to replace advice given to you by your health care provider. Make sure you discuss any questions you have with your health care provider. Document Released: 05/07/2009 Document Revised: 12/17/2015 Document Reviewed: 08/15/2014 Elsevier Interactive Patient Education  2017 [employer].

## 2022-06-01 NOTE — Progress Notes (Signed)
Virtual Visit via Telephone Note  I connected with  Brian Glance Sr. on 06/01/22 at  3:30 PM EST by telephone and verified that I am speaking with the correct person using two identifiers.  Location: Patient: home Provider: [place] Persons participating in the virtual visit: [place]   I discussed the limitations, risks, security and privacy concerns of performing an evaluation and management service by telephone and the availability of in person appointments. The patient expressed understanding and agreed to proceed.  Interactive audio and video telecommunications were attempted between this nurse and patient, however failed, due to patient having technical difficulties OR patient did not have access to video capability.  We continued and completed visit with audio only.  Some vital signs may be absent or patient reported.   Dionisio David, LPN  Subjective:   Brian KOHRS Sr. is a 76 y.o. male who presents for Medicare Annual/Subsequent preventive examination.  Review of Systems     Cardiac Risk Factors include: advanced age (>27mn, >>30women);hypertension;male gender     Objective:    There were no vitals filed for this visit. There is no height or weight on file to calculate BMI.     06/01/2022    3:26 PM 01/20/2021    2:07 PM 01/02/2020   10:35 AM 01/01/2019    8:48 AM 09/27/2018    6:20 PM 12/12/2017    9:15 AM 12/01/2016   12:35 PM  Advanced Directives  Does Patient Have a Medical Advance Directive? No No No No No No No  Does patient want to make changes to medical advance directive?  Yes (MAU/Ambulatory/Procedural Areas - Information given)       Would patient like information on creating a medical advance directive? No - Patient declined  No - Patient declined No - Patient declined No - Patient declined Yes (MAU/Ambulatory/Procedural Areas - Information given)     Current Medications (verified) Outpatient Encounter Medications as of  06/01/2022  Medication Sig   aspirin EC 81 MG tablet Take 1 tablet (81 mg total) by mouth daily.   atorvastatin (LIPITOR) 40 MG tablet Take 1 tablet (40 mg total) by mouth daily.   carvedilol (COREG) 6.25 MG tablet Take 1 tablet (6.25 mg total) by mouth 2 (two) times daily.   clopidogrel (PLAVIX) 75 MG tablet TAKE 1 TABLET BY MOUTH EVERY DAY   fluticasone (FLONASE) 50 MCG/ACT nasal spray Place 2 sprays into both nostrils daily.   Multiple Vitamins-Minerals (MULTIVITAMIN & MINERAL PO) Take 1 tablet by mouth daily.   nitroGLYCERIN (NITROSTAT) 0.4 MG SL tablet PLACE 1 TABLET (0.4 MG TOTAL) UNDER THE TONGUE EVERY 5 MINUTES AS NEEDED FOR CHEST PAIN.   pantoprazole (PROTONIX) 40 MG tablet Take 1 tablet (40 mg total) by mouth daily.   polyethylene glycol (MIRALAX) packet Take 17 g by mouth daily.   ramipril (ALTACE) 10 MG capsule Take 1 capsule (10 mg total) by mouth daily.   No facility-administered encounter medications on file as of 06/01/2022.    Allergies (verified) Ciprofloxacin, Doxycycline, and Flagyl [metronidazole]   History: Past Medical History:  Diagnosis Date   Colitis    Coronary artery disease    DJD (degenerative joint disease)    Gallbladder disease    GERD (gastroesophageal reflux disease)    Hearing loss    Heart murmur    Hyperlipidemia    Hypertension    IBS (irritable bowel syndrome)    Increased prostate specific antigen (PSA) velocity  Past Surgical History:  Procedure Laterality Date   CARDIAC CATHETERIZATION     CARDIAC CATHETERIZATION N/A 01/07/2015   Procedure: Left Heart Cath and Coronary Angiography;  Surgeon: [name], MD;  Location: [facility];  Service: Cardiovascular;  Laterality: N/A;   CARDIAC CATHETERIZATION N/A 01/07/2015   Procedure: Coronary Stent Intervention;  Surgeon: [name], MD;  Location: [facility];  Service: Cardiovascular;  Laterality: N/A;   COLONOSCOPY     CORONARY STENT PLACEMENT     Dr. [name]    CORONARY STENT PLACEMENT  01/07/2015   LAD   TONSILLECTOMY AND ADENOIDECTOMY     in the 2nd grade   Family History  Problem Relation Age of Onset   Stroke Mother    Colon cancer Neg Hx    Esophageal cancer Neg Hx    Rectal cancer Neg Hx    Stomach cancer Neg Hx    Prostate cancer Neg Hx    Bladder Cancer Neg Hx    Kidney cancer Neg Hx    Social History   Socioeconomic History   Marital status: Married    Spouse name: linda wrenn Pepitone   Number of children: 2   Years of education: Not on file   Highest education level: Not on file  Occupational History   Occupation: Engineer, production    Comment: Retired  Tobacco Use   Smoking status: Never   Smokeless tobacco: Never  Vaping Use   Vaping Use: Never used  Substance and Sexual Activity   Alcohol use: Yes    Alcohol/week: 0.0 standard drinks of alcohol    Comment: rarely drinks beer/approx. 1 case per year   Drug use: No   Sexual activity: Yes  Other Topics Concern   Not on file  Social History Narrative   Not on file   Social Determinants of Health   Financial Resource Strain: Low Risk  (06/01/2022)   Overall Financial Resource Strain (CARDIA)    Difficulty of Paying Living Expenses: Not hard at all  Food Insecurity: No Food Insecurity (06/01/2022)   Hunger Vital Sign    Worried About Running Out of Food in the Last Year: Never true    Ran Out of Food in the Last Year: Never true  Transportation Needs: No Transportation Needs (06/01/2022)   PRAPARE - Hydrologist (Medical): No    Lack of Transportation (Non-Medical): No  Physical Activity: Sufficiently Active (06/01/2022)   Exercise Vital Sign    Days of Exercise per Week: 5 days    Minutes of Exercise per Session: 60 min  Stress: No Stress Concern Present (06/01/2022)   [place]    Feeling of Stress : Not at all  Social Connections: Moderately Integrated (06/01/2022)    Social Connection and Isolation Panel [NHANES]    Frequency of Communication with Friends and Family: Twice a week    Frequency of Social Gatherings with Friends and Family: More than three times a week    Attends Religious Services: More than 4 times per year    Active Member of [employer] or Organizations: No    Attends Archivist Meetings: Never    Marital Status: Married    Tobacco Counseling Counseling given: Not Answered   Clinical Intake:  Pre-visit preparation completed: Yes  Pain : No/denies pain     Nutritional Risks: None Diabetes: No  How often do you need to have someone help  you when you read instructions, pamphlets, or other written materials from your doctor or pharmacy?: 1 - Never  Diabetic?no     Information entered by :: Kirke Shaggy LPN   Activities of Daily Living    06/01/2022    3:27 PM  In your present state of health, do you have any difficulty performing the following activities:  Hearing? 0  Vision? 0  Difficulty concentrating or making decisions? 0  Walking or climbing stairs? 0  Dressing or bathing? 0  Doing errands, shopping? 0  Preparing Food and eating ? N  Using the Toilet? N  In the past six months, have you accidently leaked urine? N  Do you have problems with loss of bowel control? N  Managing your Medications? N  Managing your Finances? N  Housekeeping or managing your Housekeeping? N    Patient Care Team: Tower, Wynelle Fanny, MD as PCP - General (Family Medicine) [name], MD as PCP - Cardiology (Cardiology) Ralene Bathe, MD as Consulting Physician (Ophthalmology) [name], MD as Consulting Physician (Cardiology)  Indicate any recent Medical Services you may have received from other than [facility] providers in the past year (date may be approximate).     Assessment:   This is a routine wellness examination for Kostas.  Hearing/Vision screen Hearing Screening - Comments:: No aids Vision  Screening - Comments:: Wears glasses- Dr.Gould  Dietary issues and exercise activities discussed: Current Exercise Habits: Home exercise routine, Type of exercise: walking, Time (Minutes): 60, Frequency (Times/Week): 5, Weekly Exercise (Minutes/Week): 300, Intensity: Mild   Goals Addressed             This Visit's Progress    DIET - EAT MORE FRUITS AND VEGETABLES         Depression Screen    06/01/2022    3:25 PM 01/13/2022    8:25 AM 01/20/2021    2:09 PM 01/08/2021    9:30 AM 01/02/2020   10:38 AM 01/01/2019    8:47 AM 12/12/2017    9:12 AM  PHQ 2/9 Scores  PHQ - 2 Score 0 0 0 0 0 0 0  PHQ- 9 Score 0  0 0 0 0 0    Fall Risk    06/01/2022    3:27 PM 01/20/2021    2:08 PM 01/08/2021    9:29 AM 01/02/2020   10:36 AM 01/01/2019    8:47 AM  Fall Risk   Falls in the past year? 0 0 0 0 0  Number falls in past yr: 0 0 0 0   Injury with Fall? 0 0 0 0   Risk for fall due to : No Fall Risks Medication side effect  Medication side effect   Follow up Falls prevention discussed Falls evaluation completed;Falls prevention discussed  Falls evaluation completed;Falls prevention discussed     FALL RISK PREVENTION PERTAINING TO THE HOME:  Any stairs in or around the home? No  If so, are there any without handrails? No  Home free of loose throw rugs in walkways, pet beds, electrical cords, etc? Yes  Adequate lighting in your home to reduce risk of falls? Yes   ASSISTIVE DEVICES UTILIZED TO PREVENT FALLS:  Life alert? No  Use of a cane, walker or w/c? No  Grab bars in the bathroom? No  Shower chair or bench in shower? Yes  Elevated toilet seat or a handicapped toilet? No     Cognitive Function:    01/20/2021    2:17 PM  01/02/2020   10:46 AM 01/01/2019    8:48 AM 12/12/2017    9:14 AM 12/01/2016   12:35 PM  MMSE - Mini Mental State Exam  Orientation to time '5 5 5 5 5  '$ Orientation to Place '5 5 5 5 5  '$ Registration '3 3 3 3 3  '$ Attention/ Calculation 5 5 0 0 0  Recall '3 3 3 2 3   '$ Recall-comments    unable to recall 1 of 3 words   Language- name 2 objects   0 0 0  Language- repeat '1 1 1 1 1  '$ Language- follow 3 step command   0 3 3  Language- read & follow direction   0 0 0  Write a sentence   0 0 0  Copy design   0 0 0  Total score   '17 19 20        '$ 06/01/2022    3:29 PM  6CIT Screen  What Year? 0 points  What month? 0 points  What time? 0 points  Count back from 20 0 points  Months in reverse 0 points  Repeat phrase 0 points  Total Score 0 points    Immunizations Immunization History  Administered Date(s) Administered   Fluad Quad(high Dose 65+) 03/28/2019, 05/07/2022   Influenza Split 04/16/2011, 04/26/2012   Influenza Whole 03/25/2009, 03/25/2010   Influenza, High Dose Seasonal PF 05/13/2017, 04/11/2018, 04/09/2020   Influenza, Seasonal, Injecte, Preservative Fre 04/28/2016   Influenza,inj,Quad PF,6+ Mos 04/10/2013, 04/02/2014, 05/04/2015   Influenza-Unspecified 05/13/2017   PFIZER Comirnaty(Gray Top)Covid-19 Tri-Sucrose Vaccine 04/26/2022   PFIZER(Purple Top)SARS-COV-2 Vaccination 09/09/2019, 10/09/2019, 05/21/2020, 06/27/2021   Pneumococcal Conjugate-13 10/31/2014   Pneumococcal Polysaccharide-23 09/16/2013   Td 09/02/2010   Zoster Recombinat (Shingrix) 12/23/2021, 03/23/2022    TDAP status: Due, Education has been provided regarding the importance of this vaccine. Advised may receive this vaccine at local pharmacy or Health Dept. Aware to provide a copy of the vaccination record if obtained from local pharmacy or Health Dept. Verbalized acceptance and understanding.  Flu Vaccine status: Up to date  Pneumococcal vaccine status: Up to date  Covid-19 vaccine status: Completed vaccines  Qualifies for Shingles Vaccine? Yes   Zostavax completed No   Shingrix Completed?: Yes  Screening Tests Health Maintenance  Topic Date Due   TETANUS/TDAP  01/21/2024 (Originally 09/02/2020)   COVID-19 Vaccine (6 - Pfizer series) 06/21/2022   Medicare  Annual Wellness (AWV)  06/02/2023   Pneumonia Vaccine 39+ Years old  Completed   INFLUENZA VACCINE  Completed   Hepatitis C Screening  Completed   Zoster Vaccines- Shingrix  Completed   HPV VACCINES  Aged Out    Health Maintenance  There are no preventive care reminders to display for this patient.   Colorectal cancer screening: Type of screening: Colonoscopy. Completed 11/29/18. Repeat every 5 years  Lung Cancer Screening: (Low Dose CT Chest recommended if Age 64-80 years, 30 pack-year currently smoking OR have quit w/in 15years.) does not qualify.   Additional Screening:  Hepatitis C Screening: does qualify; Completed 12/01/15  Vision Screening: Recommended annual ophthalmology exams for early detection of glaucoma and other disorders of the eye. Is the patient up to date with their annual eye exam?  Yes  Who is the provider or what is the name of the office in which the patient attends annual eye exams? Dr.Gould If pt is not established with a provider, would they like to be referred to a provider to establish care? No .  Dental Screening: Recommended annual dental exams for proper oral hygiene  Community Resource Referral / Chronic Care Management: CRR required this visit?  No   CCM required this visit?  No      Plan:     I have personally reviewed and noted the following in the patient's chart:   Medical and social history Use of alcohol, tobacco or illicit drugs  Current medications and supplements including opioid prescriptions. Patient is not currently taking opioid prescriptions. Functional ability and status Nutritional status Physical activity Advanced directives List of other physicians Hospitalizations, surgeries, and ER visits in previous 12 months Vitals Screenings to include cognitive, depression, and falls Referrals and appointments  In addition, I have reviewed and discussed with patient certain preventive protocols, quality metrics, and best  practice recommendations. A written personalized care plan for preventive services as well as general preventive health recommendations were provided to patient.     Dionisio David, LPN   22/11/7503   Nurse Notes: none

## 2022-06-30 DIAGNOSIS — Z4582 Encounter for adjustment or removal of myringotomy device (stent) (tube): Secondary | ICD-10-CM | POA: Diagnosis not present

## 2022-06-30 DIAGNOSIS — H6123 Impacted cerumen, bilateral: Secondary | ICD-10-CM | POA: Diagnosis not present

## 2022-06-30 DIAGNOSIS — H6981 Other specified disorders of Eustachian tube, right ear: Secondary | ICD-10-CM | POA: Diagnosis not present

## 2022-06-30 DIAGNOSIS — H903 Sensorineural hearing loss, bilateral: Secondary | ICD-10-CM | POA: Diagnosis not present

## 2022-06-30 DIAGNOSIS — Z881 Allergy status to other antibiotic agents status: Secondary | ICD-10-CM | POA: Diagnosis not present

## 2022-06-30 DIAGNOSIS — H6531 Chronic mucoid otitis media, right ear: Secondary | ICD-10-CM | POA: Diagnosis not present

## 2022-06-30 DIAGNOSIS — H6991 Unspecified Eustachian tube disorder, right ear: Secondary | ICD-10-CM | POA: Diagnosis not present

## 2022-06-30 DIAGNOSIS — Z4589 Encounter for adjustment and management of other implanted devices: Secondary | ICD-10-CM | POA: Diagnosis not present

## 2022-07-14 DIAGNOSIS — H2513 Age-related nuclear cataract, bilateral: Secondary | ICD-10-CM | POA: Diagnosis not present

## 2022-07-14 DIAGNOSIS — H5203 Hypermetropia, bilateral: Secondary | ICD-10-CM | POA: Diagnosis not present

## 2022-08-07 ENCOUNTER — Other Ambulatory Visit: Payer: Self-pay | Admitting: Family Medicine

## 2022-08-09 ENCOUNTER — Telehealth: Payer: Self-pay | Admitting: Cardiovascular Disease

## 2022-08-09 NOTE — Telephone Encounter (Signed)
   Patient Name: Brian BUSBEE Sr.  DOB: 1945/12/19 MRN: [id number]  Primary Cardiologist: Kathlyn Sacramento, MD  Chart reviewed as part of pre-operative protocol coverage.   Simple dental extractions (i.e. 1-2 teeth) are considered low risk procedures per guidelines and generally do not require any specific cardiac clearance. It is also generally accepted that for simple extractions and dental cleanings, there is no need to interrupt blood thinner therapy.   SBE prophylaxis is not required for the patient from a cardiac standpoint.  I will route this recommendation to the requesting party via Epic fax function and remove from pre-op pool.  Please call with questions.  Mable Fill, Marissa Nestle, NP 08/09/2022, 9:29 AM

## 2022-08-09 NOTE — Telephone Encounter (Signed)
   Pre-operative Risk Assessment    Patient Name: Brian DEREGO Sr.  DOB: [birth year] MRN: [id number]     Request for Surgical Clearance    Procedure:   Root canal  Date of Surgery:  Clearance TBD                                 Surgeon:  Dr. Remonia Richter Surgeon's Group or Practice Name:  Dr. Remonia Richter Office Phone number:  [phone]  Fax number:  [phone]   Type of Clearance Requested:   - Medical    Type of Anesthesia:  Local    Additional requests/questions:   Caller stated the patient will need medical clearance for this surgery.  Signed, Heloise Beecham   08/09/2022, 9:13 AM

## 2022-08-10 ENCOUNTER — Telehealth: Payer: Self-pay

## 2022-08-10 NOTE — Telephone Encounter (Signed)
Spoke with patient who is agreeable to do a tele visit on 1/19 at 10:20 am. Med rec and consent have been done.

## 2022-08-10 NOTE — Telephone Encounter (Signed)
   Pre-operative Risk Assessment    Patient Name: Brian Todd.  DOB: [birth year] MRN: [id number]{     Request for Surgical Clearance{   Procedure:  Dental Extraction - Amount of Teeth to be Pulled:  this is for a root canal  Date of Surgery:  Clearance TBD                                Surgeon:  Dr. Murrell Redden Surgeon's Group or Practice Name:  [facility] Red[facility], DDS, [facility] Phone number:  [phone] Fax number:  [phone]  Type of Clearance Requested:   - Medical    Type of Anesthesia:  General    Additional requests/questions:    SignedEli Phillips   08/10/2022, 4:09 PM

## 2022-08-10 NOTE — Telephone Encounter (Signed)
Patient call back to say that the dr office will be send over a new clearance because office is saying is not a simple extraction or dental cleaning. Stating its a root canal. Please advise

## 2022-08-10 NOTE — Telephone Encounter (Signed)
  Patient Consent for Virtual Visit        Brian LAUVER Sr. has provided verbal consent on 08/10/2022 for a virtual visit (video or telephone).   CONSENT FOR VIRTUAL VISIT FOR:  Brian Glance Sr.  By participating in this virtual visit I agree to the following:  I hereby voluntarily request, consent and authorize [facility] and its employed or contracted physicians, physician assistants, nurse practitioners or other licensed health care professionals (the Practitioner), to provide me with telemedicine health care services (the "Services") as deemed necessary by the treating Practitioner. I acknowledge and consent to receive the Services by the Practitioner via telemedicine. I understand that the telemedicine visit will involve communicating with the Practitioner through live audiovisual communication technology and the disclosure of certain medical information by electronic transmission. I acknowledge that I have been given the opportunity to request an in-person assessment or other available alternative prior to the telemedicine visit and am voluntarily participating in the telemedicine visit.  I understand that I have the right to withhold or withdraw my consent to the use of telemedicine in the course of my care at any time, without affecting my right to future care or treatment, and that the Practitioner or I may terminate the telemedicine visit at any time. I understand that I have the right to inspect all information obtained and/or recorded in the course of the telemedicine visit and may receive copies of available information for a reasonable fee.  I understand that some of the potential risks of receiving the Services via telemedicine include:  Delay or interruption in medical evaluation due to technological equipment failure or disruption; Information transmitted may not be sufficient (e.g. poor resolution of images) to allow for appropriate medical decision making by the  Practitioner; and/or  In rare instances, security protocols could fail, causing a breach of personal health information.  Furthermore, I acknowledge that it is my responsibility to provide information about my medical history, conditions and care that is complete and accurate to the best of my ability. I acknowledge that Practitioner's advice, recommendations, and/or decision may be based on factors not within their control, such as incomplete or inaccurate data provided by me or distortions of diagnostic images or specimens that may result from electronic transmissions. I understand that the practice of medicine is not an exact science and that Practitioner makes no warranties or guarantees regarding treatment outcomes. I acknowledge that a copy of this consent can be made available to me via my patient portal ([facility]), or I can request a printed copy by calling the office of [facility].    I understand that my insurance will be billed for this visit.   I have read or had this consent read to me. I understand the contents of this consent, which adequately explains the benefits and risks of the Services being provided via telemedicine.  I have been provided ample opportunity to ask questions regarding this consent and the Services and have had my questions answered to my satisfaction. I give my informed consent for the services to be provided through the use of telemedicine in my medical care

## 2022-08-10 NOTE — Telephone Encounter (Signed)
   Name: Brian HELLUMS Sr.  DOB: 1946-06-20  MRN: [id number]  Primary Cardiologist: Kathlyn Sacramento, MD   Preoperative team, please contact this patient and set up a phone call appointment for further preoperative risk assessment. Please obtain consent and complete medication review. Thank you for your help.  I confirm that guidance regarding antiplatelet and oral anticoagulation therapy has been completed and, if necessary, noted below.  Per Dr. Tyrell Antonio last note patient can hold Plavix for 5 days prior to procedure. Regarding ASA therapy, we recommend continuation of ASA throughout the perioperative period.  However, if the surgeon feels that cessation of ASA is required in the perioperative period, it may be stopped 5-7 days prior to surgery with a plan to resume it as soon as felt to be feasible from a surgical standpoint in the post-operative period.    Mable Fill, Marissa Nestle, NP 08/10/2022, 4:45 PM [facility]

## 2022-08-11 DIAGNOSIS — H903 Sensorineural hearing loss, bilateral: Secondary | ICD-10-CM | POA: Diagnosis not present

## 2022-08-11 DIAGNOSIS — H6991 Unspecified Eustachian tube disorder, right ear: Secondary | ICD-10-CM | POA: Diagnosis not present

## 2022-08-11 DIAGNOSIS — Z4589 Encounter for adjustment and management of other implanted devices: Secondary | ICD-10-CM | POA: Diagnosis not present

## 2022-08-11 DIAGNOSIS — Z9622 Myringotomy tube(s) status: Secondary | ICD-10-CM | POA: Diagnosis not present

## 2022-08-11 DIAGNOSIS — Z01118 Encounter for examination of ears and hearing with other abnormal findings: Secondary | ICD-10-CM | POA: Diagnosis not present

## 2022-08-11 DIAGNOSIS — R234 Changes in skin texture: Secondary | ICD-10-CM | POA: Diagnosis not present

## 2022-08-12 ENCOUNTER — Ambulatory Visit: Payer: Medicare HMO | Attending: Cardiology | Admitting: General Practice

## 2022-08-12 DIAGNOSIS — Z01810 Encounter for preprocedural cardiovascular examination: Secondary | ICD-10-CM

## 2022-08-12 NOTE — Progress Notes (Signed)
Virtual Visit via Telephone Note   Because of Brian Todd Sr.'s co-morbid illnesses, he is at least at moderate risk for complications without adequate follow up.  This format is felt to be most appropriate for this patient at this time.  The patient did not have access to video technology/had technical difficulties with video requiring transitioning to audio format only (telephone).  All issues noted in this document were discussed and addressed.  No physical exam could be performed with this format.  Please refer to the patient's chart for his consent to telehealth for [facility].  Evaluation Performed:  Preoperative cardiovascular risk assessment _____________   Date:  08/12/2022   Patient ID:  Brian Glance Sr., DOB 1946/06/30, MRN [id number] Patient Location:  Home Provider location:   Office  Primary Care Provider:  Abner Greenspan, Brian Todd Primary Cardiologist:  Kathlyn Sacramento, Brian Todd  Chief Complaint / Patient Profile   77 y.o. y/o male with a h/o coronary artery disease, hyperlipidemia, HTN who is pending root canal and presents today for telephonic preoperative cardiovascular risk assessment.  History of Present Illness    Brian Hearn. is a 77 y.o. male who presents via audio/video conferencing for a telehealth visit today.  Pt was last seen in cardiology clinic on 04/28/2022 by Brian Todd.  At that time Brian Todd Sr. was doing well .  The patient is now pending procedure as outlined above. Since his last visit, he remains stable from a cardiac standpoint.  Today he denies chest pain, shortness of breath, lower extremity edema, fatigue, palpitations, melena, hematuria, hemoptysis, diaphoresis, weakness, presyncope, syncope, orthopnea, and PND.   Past Medical History    Past Medical History:  Diagnosis Date   Colitis    Coronary artery disease    DJD (degenerative joint disease)    Gallbladder disease    GERD (gastroesophageal reflux disease)     Hearing loss    Heart murmur    Hyperlipidemia    Hypertension    IBS (irritable bowel syndrome)    Increased prostate specific antigen (PSA) velocity    Past Surgical History:  Procedure Laterality Date   CARDIAC CATHETERIZATION     CARDIAC CATHETERIZATION N/A 01/07/2015   Procedure: Left Heart Cath and Coronary Angiography;  Surgeon: Brian Hampshire, Brian Todd;  Location: [place] [facility];  Service: Cardiovascular;  Laterality: N/A;   CARDIAC CATHETERIZATION N/A 01/07/2015   Procedure: Coronary Stent Intervention;  Surgeon: Brian Hampshire, Brian Todd;  Location: [place] [facility];  Service: Cardiovascular;  Laterality: N/A;   COLONOSCOPY     CORONARY STENT PLACEMENT     Brian Todd   CORONARY STENT PLACEMENT  01/07/2015   LAD   TONSILLECTOMY AND ADENOIDECTOMY     in the 2nd grade    Allergies  Allergies  Allergen Reactions   Ciprofloxacin Other (See Comments)    Jittery, uneasy feeling.   Doxycycline     Headaches    Flagyl [Metronidazole]     Home Medications    Prior to Admission medications   Medication Sig Start Date End Date Taking? Authorizing Provider  aspirin EC 81 MG tablet Take 1 tablet (81 mg total) by mouth daily. 01/01/15   Brian Passy, Brian Todd  atorvastatin (LIPITOR) 40 MG tablet Take 1 tablet (40 mg total) by mouth daily. 01/13/22   Tower, Wynelle Fanny, Brian Todd  carvedilol (COREG) 6.25 MG tablet Take 1 tablet (6.25 mg total) by mouth 2 (two) times daily. 04/28/22  Brian Hampshire, Brian Todd  clopidogrel (PLAVIX) 75 MG tablet TAKE 1 TABLET BY MOUTH EVERY DAY 05/02/22   Brian Hampshire, Brian Todd  fluticasone (FLONASE) 50 MCG/ACT nasal spray Place 2 sprays into both nostrils daily.    Provider, Historical, Brian Todd  Multiple Vitamins-Minerals (MULTIVITAMIN & MINERAL PO) Take 1 tablet by mouth daily.    Provider, Historical, Brian Todd  nitroGLYCERIN (NITROSTAT) 0.4 MG SL tablet PLACE 1 TABLET (0.4 MG TOTAL) UNDER THE TONGUE EVERY 5 MINUTES AS NEEDED FOR CHEST PAIN. 04/22/21   Brian Hampshire, Brian Todd   pantoprazole (PROTONIX) 40 MG tablet Take 1 tablet (40 mg total) by mouth daily. 01/13/22   Tower, Wynelle Fanny, Brian Todd  polyethylene glycol [facility]) packet Take 17 g by mouth daily. 09/27/18   Schuyler Amor, Brian Todd  ramipril (ALTACE) 10 MG capsule TAKE 1 CAPSULE BY MOUTH EVERY DAY 08/08/22   Tower, Wynelle Fanny, Brian Todd    Physical Exam    Vital Signs:  Brian PODGORSKI Sr. does not have vital signs available for review today.  Given telephonic nature of communication, physical exam is limited. AAOx3. NAD. Normal affect.  Speech and respirations are unlabored.  Accessory Clinical Findings    None  Assessment & Plan    1.  Preoperative Cardiovascular Risk Assessment: Root canal, Dr. Carlyle Dolly, [phone]      Primary Cardiologist: Kathlyn Sacramento, Brian Todd  Chart reviewed as part of pre-operative protocol coverage. Given past medical history and time since last visit, based on ACC/AHA guidelines, Brian PAOLUCCI Sr. would be at acceptable risk for the planned procedure without further cardiovascular testing.   His Plavix may be held for 5 days prior to his procedure.  His aspirin therapy should be continued throughout the perioperative period.    Patient was advised that if he develops new symptoms prior to surgery to contact our office to arrange a follow-up appointment.  He verbalized understanding.  I will route this recommendation to the requesting party via Epic fax function and remove from pre-op pool.      Time:   Today, I have spent 5 minutes with the patient with telehealth technology discussing medical history, symptoms, and management plan.  Prior to his phone evaluation spent greater than 10 minutes reviewing his past medical history and cardiac medications.   Brian Pelton, Brian Todd  08/12/2022, 7:43 AM

## 2022-08-16 NOTE — Telephone Encounter (Signed)
Requesting office sent over a duplicate request, stating that the procedure is not simple extractions but is a root canal.   It has been noted in notes that we are aware this is for a root canal and the clearance and recommendations have been given in regard to procedure for a root canal. I will re-fax notes to requesting office.

## 2022-08-17 NOTE — Telephone Encounter (Signed)
Our cardiologist follow a pre op protocol that was designed to keep all information concise and accurate. [facility] has been following this protocol for 5+ yrs.   All pertinent information is gather and entered in a uniform format for the cardiologist and the pre op APP. We no longer keep the original forms, as we extract out all the information to be sure that our team is making a well informed decision. I will update the requesting office with this note as well.

## 2022-08-17 NOTE — Telephone Encounter (Signed)
Requesting office states that they have received notes, etc., for this this clearance, but they would like only the form they state they faxed over originally to be sent back to them. I informed caller that it does look like information was given to scheduler and I do not see mention of any forms received and requested they fax the form again to be filled out.

## 2022-08-23 ENCOUNTER — Encounter: Payer: Self-pay | Admitting: Family Medicine

## 2022-08-23 ENCOUNTER — Ambulatory Visit (INDEPENDENT_AMBULATORY_CARE_PROVIDER_SITE_OTHER): Payer: Medicare HMO | Admitting: Family Medicine

## 2022-08-23 VITALS — BP 128/62 | HR 51 | Temp 98.0°F | Ht 67.0 in | Wt 205.2 lb

## 2022-08-23 DIAGNOSIS — R6884 Jaw pain: Secondary | ICD-10-CM | POA: Diagnosis not present

## 2022-08-23 DIAGNOSIS — J029 Acute pharyngitis, unspecified: Secondary | ICD-10-CM

## 2022-08-23 NOTE — Assessment & Plan Note (Signed)
Left side only- sore spot that he can feel with swallow, worse when dry (drinking water improves it) I cannot visualize far enough today Disc poss of lesion/ulcer/ irritated area or possible acid reflux related problem   Recommend he sees ENT for more direct visualization of the area   Also c/o jaw pain in TMJ area today- unsure if related

## 2022-08-23 NOTE — Patient Instructions (Signed)
You may have TMJ Avoid excessive chewing  Avoid gum When it bothers you- may be better to sip through a straw  Use warm compress as needed Voltaren gal 1% over the counter may be helpful- small amount to affected area up to four times daily   Let us know if it does not help  You may need a bite guard (can talk to your dentist)   The throat complaint needs to be evaluated by ENT  May need a scope to look at the area   Call and make an early appt with your ENT for that  If jaw is not improved mention that also

## 2022-08-23 NOTE — Assessment & Plan Note (Signed)
Jaw pain on L , in front of ear  This comes and goes, does not bother him today  Began after dental work and possible change in bit  Nl exam/ no tenderness today  I suspect this is TMJ based on nature and location and history  Not bothersome today so no PE findings Advised that when this flares he needs to avoid chewy foods and gum / sip with straw if needed Can try voltaren gel 1% otc Warm compress prn  Check in with dentist for intervention if grinding or if jaw is not lined up  Update if not starting to improve in a week or if worsening   Pt also c/o L ST today-   ? If related but adv him to f/u with ENT

## 2022-08-23 NOTE — Progress Notes (Signed)
Subjective:    Patient ID: Brian Coil., male    DOB: [birth year], 77 y.o.   MRN: [id number]  HPI Pt presents for a problem with his jaw  Also left throat pain   Wt Readings from Last 3 Encounters:  08/23/22 205 lb 4 oz (93.1 kg)  06/01/22 195 lb (88.5 kg)  05/10/22 195 lb (88.5 kg)   32.15 kg/m  Vitals:   08/23/22 0758  BP: 128/62  Pulse: (!) 51  Temp: 98 F (36.7 C)  SpO2: 97%   Pain in his L jaw in front of ear  Sometimes bothers him when he chews  Opening wide does not bother him today  Jaw popped in the past - not recently   Takes tylenol occasionally  Cannot have nsaids  Uses some vics vapor rub on the spot   Worse on Friday- but gradually improved since then   Has had in the past, tends to come and go  Since he had a crown this past fall (post upper molar and it was hard to get to)   Occ finds himself adj jaw at night  Not aware of any grinding   Hurts to swallow on the L side of throat deep also  Since the fall ( after the throat started hurting) Dry feeling Better to swallow water  Worse in am, better as the day goes on   He does get pnd for the past few years Has to clear throat and swallow a lot  Keeps a bottle of water with him   Sneezes quite a bit Not a lot of runny nose or sniffles    GERD- takes protonix  Symptoms are up and down  Occ heartburn/indigestion and feels acid in throat  (this occurs 2-3 times per year) Last episode was a few months ago    ? If it is the joint   Saw endodontist and dentist- all is fine Saw ENT- no new findings    Patient Active Problem List   Diagnosis Date Noted   Jaw pain 08/23/2022   Candida, oral 03/08/2022   Sore throat 03/08/2022   Tinnitus of right ear 10/08/2021   Hearing loss sensory, bilateral 10/07/2021   Seasonal allergic rhinitis due to pollen 10/07/2021   Bilateral impacted cerumen 10/31/2019   Sensorineural hearing loss (SNHL) of both ears 10/31/2019   Aortic atherosclerosis  ([place]) 09/30/2018   ETD (eustachian tube dysfunction) 10/05/2016   Coronary artery disease involving native coronary artery without angina pectoris 04/09/2015   History of heart artery stent 01/01/2015   History of colitis 11/08/2014   DEGENERATIVE JOINT DISEASE 07/30/2009   Elevated PSA 07/30/2009   HEARING LOSS, MILD 11/27/2007   IRRITABLE BOWEL SYNDROME 11/27/2007   Hyperlipidemia 08/13/2007   Essential hypertension 08/13/2007   GERD 08/13/2007   Past Medical History:  Diagnosis Date   Colitis    Coronary artery disease    DJD (degenerative joint disease)    Gallbladder disease    GERD (gastroesophageal reflux disease)    Hearing loss    Heart murmur    Hyperlipidemia    Hypertension    IBS (irritable bowel syndrome)    Increased prostate specific antigen (PSA) velocity    Past Surgical History:  Procedure Laterality Date   CARDIAC CATHETERIZATION     CARDIAC CATHETERIZATION N/A 01/07/2015   Procedure: Left Heart Cath and Coronary Angiography;  Surgeon: [name], MD;  Location: [place] [facility];  Service: Cardiovascular;  Laterality: N/A;  CARDIAC CATHETERIZATION N/A 01/07/2015   Procedure: Coronary Stent Intervention;  Surgeon: [name], MD;  Location: [place] [facility];  Service: Cardiovascular;  Laterality: N/A;   COLONOSCOPY     CORONARY STENT PLACEMENT     Dr. [name]   CORONARY STENT PLACEMENT  01/07/2015   LAD   TONSILLECTOMY AND ADENOIDECTOMY     in the 2nd grade   Social History   Tobacco Use   Smoking status: Never   Smokeless tobacco: Never  Vaping Use   Vaping Use: Never used  Substance Use Topics   Alcohol use: Yes    Alcohol/week: 0.0 standard drinks of alcohol    Comment: rarely drinks beer/approx. 1 case per year   Drug use: No   Family History  Problem Relation Age of Onset   Stroke Mother    Colon cancer Neg Hx    Esophageal cancer Neg Hx    Rectal cancer Neg Hx    Stomach cancer Neg Hx    Prostate cancer Neg Hx     Bladder Cancer Neg Hx    Kidney cancer Neg Hx    Allergies  Allergen Reactions   Ciprofloxacin Other (See Comments)    Jittery, uneasy feeling.   Doxycycline     Headaches    Flagyl [Metronidazole]    Current Outpatient Medications on File Prior to Visit  Medication Sig Dispense Refill   aspirin EC 81 MG tablet Take 1 tablet (81 mg total) by mouth daily.     atorvastatin (LIPITOR) 40 MG tablet Take 1 tablet (40 mg total) by mouth daily. 90 tablet 3   carvedilol (COREG) 6.25 MG tablet Take 1 tablet (6.25 mg total) by mouth 2 (two) times daily. 180 tablet 3   clopidogrel (PLAVIX) 75 MG tablet TAKE 1 TABLET BY MOUTH EVERY DAY 90 tablet 3   fluticasone (FLONASE) 50 MCG/ACT nasal spray Place 2 sprays into both nostrils daily.     Multiple Vitamins-Minerals (MULTIVITAMIN & MINERAL PO) Take 1 tablet by mouth daily.     nitroGLYCERIN (NITROSTAT) 0.4 MG SL tablet PLACE 1 TABLET (0.4 MG TOTAL) UNDER THE TONGUE EVERY 5 MINUTES AS NEEDED FOR CHEST PAIN. 25 tablet 1   pantoprazole (PROTONIX) 40 MG tablet Take 1 tablet (40 mg total) by mouth daily. 90 tablet 3   polyethylene glycol (MIRALAX) packet Take 17 g by mouth daily. 14 each 0   ramipril (ALTACE) 10 MG capsule TAKE 1 CAPSULE BY MOUTH EVERY DAY 90 capsule 3   No current facility-administered medications on file prior to visit.    Review of Systems  Constitutional:  Negative for activity change, appetite change, fatigue, fever and unexpected weight change.  HENT:  Positive for dental problem, hearing loss, postnasal drip, sneezing and sore throat. Negative for congestion, drooling, ear discharge, ear pain, facial swelling, mouth sores, nosebleeds, rhinorrhea, sinus pressure, sinus pain, trouble swallowing and voice change.        Left jaw pain   Eyes:  Negative for pain, redness, itching and visual disturbance.  Respiratory:  Negative for cough, choking, chest tightness, shortness of breath, wheezing and stridor.   Cardiovascular:  Negative  for chest pain and palpitations.  Gastrointestinal:  Negative for abdominal pain, blood in stool, constipation, diarrhea and nausea.  Endocrine: Negative for cold intolerance, heat intolerance, polydipsia and polyuria.  Genitourinary:  Negative for difficulty urinating, dysuria, frequency and urgency.  Musculoskeletal:  Negative for arthralgias, joint swelling and myalgias.  Skin:  Negative for pallor and rash.  Neurological:  Negative for dizziness, tremors, weakness, numbness and headaches.  Hematological:  Negative for adenopathy. Does not bruise/bleed easily.  Psychiatric/Behavioral:  Negative for decreased concentration and dysphoric mood. The patient is not nervous/anxious.        Objective:   Physical Exam Constitutional:      General: He is not in acute distress.    Appearance: Normal appearance. He is obese. He is not ill-appearing or diaphoretic.  HENT:     Head: Normocephalic and atraumatic.     Right Ear: Tympanic membrane and ear canal normal.     Left Ear: Tympanic membrane and ear canal normal.     Ears:     Comments: Tube present in R TM- appears in place and open    Nose: No congestion or rhinorrhea.     Mouth/Throat:     Mouth: Mucous membranes are moist.     Pharynx: Oropharynx is clear. No oropharyngeal exudate or posterior oropharyngeal erythema.     Comments: No masses or swelling or lesions in area I can see   No pain with tapping teeth Eyes:     General:        Right eye: No discharge.        Left eye: No discharge.     Extraocular Movements: Extraocular movements intact.     Conjunctiva/sclera: Conjunctivae normal.     Pupils: Pupils are equal, round, and reactive to light.  Cardiovascular:     Rate and Rhythm: Regular rhythm. Bradycardia present.     Heart sounds: Normal heart sounds.  Pulmonary:     Effort: Pulmonary effort is normal. No respiratory distress.     Breath sounds: Normal breath sounds. No stridor. No wheezing, rhonchi or rales.   Abdominal:     General: There is no distension.     Palpations: There is no mass.     Tenderness: There is no abdominal tenderness.  Musculoskeletal:     Cervical back: Neck supple. No tenderness.  Lymphadenopathy:     Cervical: No cervical adenopathy.  Skin:    Coloration: Skin is not pale.     Findings: No erythema or rash.  Neurological:     Mental Status: He is alert.     Cranial Nerves: No cranial nerve deficit.  Psychiatric:        Mood and Affect: Mood normal.           Assessment & Plan:   Problem List Items Addressed This Visit       Other   Jaw pain - Primary    Jaw pain on L , in front of ear  This comes and goes, does not bother him today  Began after dental work and possible change in bit  Nl exam/ no tenderness today  I suspect this is TMJ based on nature and location and history  Not bothersome today so no PE findings Advised that when this flares he needs to avoid chewy foods and gum / sip with straw if needed Can try voltaren gel 1% otc Warm compress prn  Check in with dentist for intervention if grinding or if jaw is not lined up  Update if not starting to improve in a week or if worsening   Pt also c/o L ST today-   ? If related but adv him to f/u with ENT      Sore throat    Left side only- sore spot that he can feel with swallow, worse when dry (drinking  water improves it) I cannot visualize far enough today Disc poss of lesion/ulcer/ irritated area or possible acid reflux related problem   Recommend he sees ENT for more direct visualization of the area   Also c/o jaw pain in TMJ area today- unsure if related

## 2022-09-12 ENCOUNTER — Encounter: Payer: Self-pay | Admitting: Family Medicine

## 2022-09-12 ENCOUNTER — Ambulatory Visit (INDEPENDENT_AMBULATORY_CARE_PROVIDER_SITE_OTHER): Payer: Medicare HMO | Admitting: Family Medicine

## 2022-09-12 VITALS — BP 125/80 | HR 74 | Temp 98.2°F | Ht 67.0 in | Wt 196.0 lb

## 2022-09-12 DIAGNOSIS — R051 Acute cough: Secondary | ICD-10-CM | POA: Diagnosis not present

## 2022-09-12 DIAGNOSIS — J029 Acute pharyngitis, unspecified: Secondary | ICD-10-CM

## 2022-09-12 DIAGNOSIS — J069 Acute upper respiratory infection, unspecified: Secondary | ICD-10-CM | POA: Diagnosis not present

## 2022-09-12 LAB — POCT INFLUENZA A/B
Influenza A, POC: NEGATIVE
Influenza B, POC: NEGATIVE

## 2022-09-12 LAB — POC COVID19 BINAXNOW: SARS Coronavirus 2 Ag: NEGATIVE

## 2022-09-12 MED ORDER — BENZONATATE 200 MG PO CAPS: 200.0000 mg | ORAL_CAPSULE | Freq: Three times a day (TID) | ORAL | 1 refills | Status: DC | PRN

## 2022-09-12 NOTE — Patient Instructions (Addendum)
Drink fluids and rest  mucinex DM is good for cough and congestion  You can try the tessalon pearles also  Nasal saline for congestion as needed  Flonase may help congestion also  Tylenol for fever or pain or headache  Please alert [place] if symptoms worsen (if severe or short of breath please go to the ER)    Take care of yourself   It the prior throat problem persists after you cold resolves then let me know   Update if not starting to improve in a week or if worsening

## 2022-09-12 NOTE — Assessment & Plan Note (Signed)
With st, congestion and cough / no fever  Neg covid and flu tests today  ST is different from his chronic problem as well  Disc symptom control ER parameters noted  Px tessalon for cough as well  Enc fluids and rest  Update if not starting to improve in a week or if worsening

## 2022-09-12 NOTE — Assessment & Plan Note (Signed)
Pt now has a viral uri so unsure if this L sided pain is still problematic Found out is ear doctor does not see patents for throat symptoms   Inst to get through his current cold/ uri and then if the L sided ST persists to call us so I can work on a new ENT referral He agreed to do so

## 2022-09-12 NOTE — Progress Notes (Signed)
Subjective:    Patient ID: Brian Coil., male    DOB: 1946/05/26, 77 y.o.   MRN: [id number]  HPI Pt presents for uri symptoms with ST  Wt Readings from Last 3 Encounters:  09/12/22 196 lb (88.9 kg)  08/23/22 205 lb 4 oz (93.1 kg)  06/01/22 195 lb (88.5 kg)   30.70 kg/m  Vitals:   09/12/22 1503  BP: (!) 144/80  Pulse: 74  Temp: 98.2 F (36.8 C)  SpO2: 97%   Caught something from his wife   Symptoms started about a week ago  Sinus congestion started sat eve   ST- worse in am when waking up   Congestion - mucous- clear  Cough -now phlegm , is yellow  Watery eyes - not itchy  Little nausea/no vomiting  No appetite  Tired   No sob  No wheezing   Fever -none  No chills or body aches    Otc  Tylneol   Has an ear doctor  Does not treat throat symptoms    Results for orders placed or performed in visit on 09/12/22  POC COVID-19  Result Value Ref Range   SARS Coronavirus 2 Ag Negative Negative  POCT Influenza A/B  Result Value Ref Range   Influenza A, POC Negative Negative   Influenza B, POC Negative Negative     Patient Active Problem List   Diagnosis Date Noted   Jaw pain 08/23/2022   Candida, oral 03/08/2022   Sore throat 03/08/2022   Tinnitus of right ear 10/08/2021   Hearing loss sensory, bilateral 10/07/2021   Seasonal allergic rhinitis due to pollen 10/07/2021   Bilateral impacted cerumen 10/31/2019   Sensorineural hearing loss (SNHL) of both ears 10/31/2019   Aortic atherosclerosis ([place]) 09/30/2018   ETD (eustachian tube dysfunction) 10/05/2016   Viral URI with cough 09/19/2016   Coronary artery disease involving native coronary artery without angina pectoris 04/09/2015   History of heart artery stent 01/01/2015   History of colitis 11/08/2014   DEGENERATIVE JOINT DISEASE 07/30/2009   Elevated PSA 07/30/2009   HEARING LOSS, MILD 11/27/2007   IRRITABLE BOWEL SYNDROME 11/27/2007   Hyperlipidemia 08/13/2007   Essential hypertension  08/13/2007   GERD 08/13/2007   Past Medical History:  Diagnosis Date   Colitis    Coronary artery disease    DJD (degenerative joint disease)    Gallbladder disease    GERD (gastroesophageal reflux disease)    Hearing loss    Heart murmur    Hyperlipidemia    Hypertension    IBS (irritable bowel syndrome)    Increased prostate specific antigen (PSA) velocity    Past Surgical History:  Procedure Laterality Date   CARDIAC CATHETERIZATION     CARDIAC CATHETERIZATION N/A 01/07/2015   Procedure: Left Heart Cath and Coronary Angiography;  Surgeon: [name], MD;  Location: [facility];  Service: Cardiovascular;  Laterality: N/A;   CARDIAC CATHETERIZATION N/A 01/07/2015   Procedure: Coronary Stent Intervention;  Surgeon: [name], MD;  Location: [facility];  Service: Cardiovascular;  Laterality: N/A;   COLONOSCOPY     CORONARY STENT PLACEMENT     Dr. [name]   CORONARY STENT PLACEMENT  01/07/2015   LAD   TONSILLECTOMY AND ADENOIDECTOMY     in the 2nd grade   Social History   Tobacco Use   Smoking status: Never   Smokeless tobacco: Never  Vaping Use   Vaping Use: Never used  Substance Use Topics  Alcohol use: Yes    Alcohol/week: 0.0 standard drinks of alcohol    Comment: rarely drinks beer/approx. 1 case per year   Drug use: No   Family History  Problem Relation Age of Onset   Stroke Mother    Colon cancer Neg Hx    Esophageal cancer Neg Hx    Rectal cancer Neg Hx    Stomach cancer Neg Hx    Prostate cancer Neg Hx    Bladder Cancer Neg Hx    Kidney cancer Neg Hx    Allergies  Allergen Reactions   Ciprofloxacin Other (See Comments)    Jittery, uneasy feeling.   Doxycycline     Headaches    Flagyl [Metronidazole]    Current Outpatient Medications on File Prior to Visit  Medication Sig Dispense Refill   aspirin EC 81 MG tablet Take 1 tablet (81 mg total) by mouth daily.     atorvastatin (LIPITOR) 40 MG tablet Take 1 tablet (40 mg  total) by mouth daily. 90 tablet 3   carvedilol (COREG) 6.25 MG tablet Take 1 tablet (6.25 mg total) by mouth 2 (two) times daily. 180 tablet 3   clopidogrel (PLAVIX) 75 MG tablet TAKE 1 TABLET BY MOUTH EVERY DAY 90 tablet 3   fluticasone (FLONASE) 50 MCG/ACT nasal spray Place 2 sprays into both nostrils daily.     Multiple Vitamins-Minerals (MULTIVITAMIN & MINERAL PO) Take 1 tablet by mouth daily.     nitroGLYCERIN (NITROSTAT) 0.4 MG SL tablet PLACE 1 TABLET (0.4 MG TOTAL) UNDER THE TONGUE EVERY 5 MINUTES AS NEEDED FOR CHEST PAIN. 25 tablet 1   pantoprazole (PROTONIX) 40 MG tablet Take 1 tablet (40 mg total) by mouth daily. 90 tablet 3   polyethylene glycol (MIRALAX) packet Take 17 g by mouth daily. 14 each 0   ramipril (ALTACE) 10 MG capsule TAKE 1 CAPSULE BY MOUTH EVERY DAY 90 capsule 3   No current facility-administered medications on file prior to visit.     Review of Systems  Constitutional:  Positive for appetite change and fatigue. Negative for fever.  HENT:  Positive for congestion, postnasal drip, rhinorrhea, sinus pressure, sneezing and sore throat. Negative for ear pain and trouble swallowing.   Eyes:  Negative for pain and discharge.  Respiratory:  Positive for cough. Negative for shortness of breath, wheezing and stridor.   Cardiovascular:  Negative for chest pain.  Gastrointestinal:  Negative for diarrhea, nausea and vomiting.  Genitourinary:  Negative for frequency, hematuria and urgency.  Musculoskeletal:  Negative for arthralgias and myalgias.  Skin:  Negative for rash.  Neurological:  Positive for headaches. Negative for dizziness, weakness and light-headedness.  Psychiatric/Behavioral:  Negative for confusion and dysphoric mood.        Objective:   Physical Exam Constitutional:      General: He is not in acute distress.    Appearance: Normal appearance. He is well-developed. He is obese. He is not ill-appearing, toxic-appearing or diaphoretic.  HENT:     Head:  Normocephalic and atraumatic.     Comments: Nares are injected and congested      Right Ear: Tympanic membrane, ear canal and external ear normal.     Left Ear: Tympanic membrane, ear canal and external ear normal.     Nose: Congestion and rhinorrhea present.     Mouth/Throat:     Mouth: Mucous membranes are moist.     Pharynx: Oropharynx is clear. No oropharyngeal exudate or posterior oropharyngeal erythema.  Comments: Clear pnd   No erythema  No lesions or swelling Eyes:     General:        Right eye: No discharge.        Left eye: No discharge.     Conjunctiva/sclera: Conjunctivae normal.     Pupils: Pupils are equal, round, and reactive to light.  Cardiovascular:     Rate and Rhythm: Normal rate.     Heart sounds: Normal heart sounds.  Pulmonary:     Effort: Pulmonary effort is normal. No respiratory distress.     Breath sounds: Normal breath sounds. No stridor. No wheezing, rhonchi or rales.     Comments: Good air exch No rales or rhonchi  Some upper airway sounds  Chest:     Chest wall: No tenderness.  Musculoskeletal:     Cervical back: Normal range of motion and neck supple. No tenderness.  Lymphadenopathy:     Cervical: No cervical adenopathy.  Skin:    General: Skin is warm and dry.     Capillary Refill: Capillary refill takes less than 2 seconds.     Findings: No rash.  Neurological:     Mental Status: He is alert.     Cranial Nerves: No cranial nerve deficit.  Psychiatric:        Mood and Affect: Mood normal.           Assessment & Plan:   Problem List Items Addressed This Visit       Respiratory   Viral URI with cough - Primary    With st, congestion and cough / no fever  Neg covid and flu tests today  ST is different from his chronic problem as well  Disc symptom control ER parameters noted  Px tessalon for cough as well  Enc fluids and rest  Update if not starting to improve in a week or if worsening          Other   Sore throat     Pt now has a viral uri so unsure if this L sided pain is still problematic Found out is ear doctor does not see patents for throat symptoms   Inst to get through his current cold/ uri and then if the L sided ST persists to call us so I can work on a new ENT referral He agreed to do so      Other Visit Diagnoses     Acute cough       Relevant Orders   POC COVID-19 (Completed)   POCT Influenza A/B (Completed)

## 2022-11-06 ENCOUNTER — Other Ambulatory Visit: Payer: Self-pay | Admitting: Family

## 2022-11-06 DIAGNOSIS — J301 Allergic rhinitis due to pollen: Secondary | ICD-10-CM

## 2022-11-07 NOTE — Telephone Encounter (Signed)
Do not see where you have given in the past but was recommended at last office visit with you and on med list. Ok to call in refill as requested?

## 2022-11-08 ENCOUNTER — Other Ambulatory Visit: Payer: Self-pay | Admitting: Cardiovascular Disease

## 2022-12-29 DIAGNOSIS — H7111 Cholesteatoma of tympanum, right ear: Secondary | ICD-10-CM | POA: Diagnosis not present

## 2022-12-29 DIAGNOSIS — H66004 Acute suppurative otitis media without spontaneous rupture of ear drum, recurrent, right ear: Secondary | ICD-10-CM | POA: Diagnosis not present

## 2022-12-29 DIAGNOSIS — H9211 Otorrhea, right ear: Secondary | ICD-10-CM | POA: Diagnosis not present

## 2023-01-08 ENCOUNTER — Telehealth: Payer: Self-pay | Admitting: Family Medicine

## 2023-01-08 DIAGNOSIS — I10 Essential (primary) hypertension: Secondary | ICD-10-CM

## 2023-01-08 DIAGNOSIS — E7800 Pure hypercholesterolemia, unspecified: Secondary | ICD-10-CM

## 2023-01-08 DIAGNOSIS — R972 Elevated prostate specific antigen [PSA]: Secondary | ICD-10-CM

## 2023-01-08 DIAGNOSIS — Z79899 Other long term (current) drug therapy: Secondary | ICD-10-CM | POA: Insufficient documentation

## 2023-01-08 NOTE — Telephone Encounter (Signed)
-----   Message from Alvina Chou sent at 12/30/2022  4:00 PM EDT ----- Regarding: Lab orders for Monday, 6.17.24 Patient is scheduled for CPX labs, please order future labs, Thanks , Camelia Eng

## 2023-01-09 ENCOUNTER — Other Ambulatory Visit (INDEPENDENT_AMBULATORY_CARE_PROVIDER_SITE_OTHER): Payer: Medicare HMO

## 2023-01-09 DIAGNOSIS — Z79899 Other long term (current) drug therapy: Secondary | ICD-10-CM | POA: Diagnosis not present

## 2023-01-09 DIAGNOSIS — E7800 Pure hypercholesterolemia, unspecified: Secondary | ICD-10-CM | POA: Diagnosis not present

## 2023-01-09 DIAGNOSIS — R972 Elevated prostate specific antigen [PSA]: Secondary | ICD-10-CM | POA: Diagnosis not present

## 2023-01-09 DIAGNOSIS — I10 Essential (primary) hypertension: Secondary | ICD-10-CM | POA: Diagnosis not present

## 2023-01-09 LAB — VITAMIN B12: Vitamin B-12: 375 pg/mL (ref 211–911)

## 2023-01-09 LAB — COMPREHENSIVE METABOLIC PANEL
ALT: 17 U/L (ref 0–53)
AST: 19 U/L (ref 0–37)
Albumin: 4.1 g/dL (ref 3.5–5.2)
Alkaline Phosphatase: 112 U/L (ref 39–117)
BUN: 22 mg/dL (ref 6–23)
CO2: 29 mEq/L (ref 19–32)
Calcium: 9.2 mg/dL (ref 8.4–10.5)
Chloride: 102 mEq/L (ref 96–112)
Creatinine, Ser: 1.12 mg/dL (ref 0.40–1.50)
GFR: 64.39 mL/min (ref >60.00)
Glucose, Bld: 92 mg/dL (ref 70–99)
Potassium: 4.8 mEq/L (ref 3.5–5.1)
Sodium: 138 mEq/L (ref 135–145)
Total Bilirubin: 0.8 mg/dL (ref 0.2–1.2)
Total Protein: 7.1 g/dL (ref 6.0–8.3)

## 2023-01-09 LAB — CBC WITH DIFFERENTIAL/PLATELET
Basophils Absolute: 0 10*3/uL (ref 0.0–0.1)
Basophils Relative: 0.3 % (ref 0.0–3.0)
Eosinophils Absolute: 0.3 10*3/uL (ref 0.0–0.7)
Eosinophils Relative: 4.3 % (ref 0.0–5.0)
HCT: 44.9 % (ref 39.0–52.0)
Hemoglobin: 14.6 g/dL (ref 13.0–17.0)
Lymphocytes Relative: 23.7 % (ref 12.0–46.0)
Lymphs Abs: 1.6 10*3/uL (ref 0.7–4.0)
MCHC: 32.6 g/dL (ref 30.0–36.0)
MCV: 94 fl (ref 78.0–100.0)
Monocytes Absolute: 0.7 10*3/uL (ref 0.1–1.0)
Monocytes Relative: 10.8 % (ref 3.0–12.0)
Neutro Abs: 4.1 10*3/uL (ref 1.4–7.7)
Neutrophils Relative %: 60.9 % (ref 43.0–77.0)
Platelets: 212 10*3/uL (ref 150.0–400.0)
RBC: 4.78 Mil/uL (ref 4.22–5.81)
RDW: 13.1 % (ref 11.5–15.5)
WBC: 6.7 10*3/uL (ref 4.0–10.5)

## 2023-01-09 LAB — PSA, MEDICARE: PSA: 3.61 ng/ml (ref 0.10–4.00)

## 2023-01-09 LAB — LIPID PANEL
Cholesterol: 127 mg/dL (ref 0–200)
HDL: 40.8 mg/dL (ref >39.00)
LDL Cholesterol: 56 mg/dL (ref 0–99)
NonHDL: 86.67
Total CHOL/HDL Ratio: 3
Triglycerides: 155 mg/dL — ABNORMAL HIGH (ref 0.0–149.0)
VLDL: 31 mg/dL (ref 0.0–40.0)

## 2023-01-09 LAB — TSH: TSH: 3.01 u[IU]/mL (ref 0.35–5.50)

## 2023-01-16 ENCOUNTER — Ambulatory Visit (INDEPENDENT_AMBULATORY_CARE_PROVIDER_SITE_OTHER): Payer: Medicare HMO | Admitting: Family Medicine

## 2023-01-16 ENCOUNTER — Encounter: Payer: Self-pay | Admitting: Family Medicine

## 2023-01-16 VITALS — BP 136/80 | HR 79 | Temp 97.8°F | Ht 67.5 in | Wt 192.5 lb

## 2023-01-16 DIAGNOSIS — E7800 Pure hypercholesterolemia, unspecified: Secondary | ICD-10-CM

## 2023-01-16 DIAGNOSIS — Z79899 Other long term (current) drug therapy: Secondary | ICD-10-CM

## 2023-01-16 DIAGNOSIS — K219 Gastro-esophageal reflux disease without esophagitis: Secondary | ICD-10-CM

## 2023-01-16 DIAGNOSIS — I10 Essential (primary) hypertension: Secondary | ICD-10-CM

## 2023-01-16 DIAGNOSIS — R972 Elevated prostate specific antigen [PSA]: Secondary | ICD-10-CM

## 2023-01-16 DIAGNOSIS — I700 Atherosclerosis of aorta: Secondary | ICD-10-CM

## 2023-01-16 DIAGNOSIS — Z0000 Encounter for general adult medical examination without abnormal findings: Secondary | ICD-10-CM

## 2023-01-16 MED ORDER — RAMIPRIL 10 MG PO CAPS: 10.0000 mg | ORAL_CAPSULE | Freq: Every day | ORAL | 3 refills | Status: DC

## 2023-01-16 MED ORDER — ATORVASTATIN CALCIUM 40 MG PO TABS: 40.0000 mg | ORAL_TABLET | Freq: Every day | ORAL | 3 refills | Status: DC

## 2023-01-16 NOTE — Assessment & Plan Note (Signed)
Continues protonix  Vit B12 level is in the normal range

## 2023-01-16 NOTE — Assessment & Plan Note (Signed)
Reviewed health habits including diet and exercise and skin cancer prevention Reviewed appropriate screening tests for age  Also reviewed health mt list, fam hx and immunization status , as well as social and family history   See HPI Labs reviewed and ordered Plans to check into Td at pharmacy  Psa is improved  Colonoscopy utd 2020  No falls or fractures PHQ score of 0

## 2023-01-16 NOTE — Assessment & Plan Note (Signed)
Lab Results  Component Value Date   PSA1 3.6 05/10/2022   PSA1 4.3 (H) 02/03/2022   PSA 3.61 01/09/2023   PSA 3.83 01/04/2022   PSA 2.41 01/01/2021    Saw urology last summer  Psa today is improved No voiding symptoms at all

## 2023-01-16 NOTE — Patient Instructions (Addendum)
Stay active Add some strength training to your routine, this is important for bone and brain health and can reduce your risk of falls and help your body use insulin properly and regulate weight  Light weights, exercise bands , and internet videos are a good way to start  Yoga (chair or regular), machines , floor exercises or a gym with machines are also good options   Use sun protection   Eat a healthy balanced diet   Try to get most of your carbohydrates from produce (with the exception of white potatoes)  Eat less bread/pasta/rice/snack foods/cereals/sweets and other items from the middle of the grocery store (processed carbs)  No change in medications

## 2023-01-16 NOTE — Assessment & Plan Note (Signed)
Disc goals for lipids and reasons to control them Rev last labs with pt Rev low sat fat diet in detail LDL controlled at 56  Plan to continue atorvastatin 40 mg daily

## 2023-01-16 NOTE — Progress Notes (Signed)
Subjective:    Patient ID: Brian Junker., male    DOB: [birth year], 77 y.o.   MRN: [id number]  HPI  Here for health maintenance exam and to review chronic medical problems   Wt Readings from Last 3 Encounters:  01/16/23 192 lb 8 oz (87.3 kg)  09/12/22 196 lb (88.9 kg)  08/23/22 205 lb 4 oz (93.1 kg)   29.70 kg/m  Vitals:   01/16/23 0808  BP: 136/80  Pulse: 79  Temp: 97.8 F (36.6 C)  SpO2: 96%   Feeling ok  Sees ENT- had drainage from ear/ scar tissue around ear  Has follow up in July  Still feels a little sore/ right ear   Immunization History  Administered Date(s) Administered   Fluad Quad(high Dose 65+) 03/28/2019, 05/07/2022   Influenza Split 04/16/2011, 04/26/2012   Influenza Whole 03/25/2009, 03/25/2010   Influenza, High Dose Seasonal PF 05/13/2017, 04/11/2018, 04/09/2020   Influenza, Seasonal, Injecte, Preservative Fre 04/28/2016   Influenza,inj,Quad PF,6+ Mos 04/10/2013, 04/02/2014, 05/04/2015   Influenza-Unspecified 05/13/2017   PFIZER Comirnaty(Gray Top)Covid-19 Tri-Sucrose Vaccine 04/26/2022   PFIZER(Purple Top)SARS-COV-2 Vaccination 09/09/2019, 10/09/2019, 05/21/2020, 06/27/2021   Pneumococcal Conjugate-13 10/31/2014   Pneumococcal Polysaccharide-23 09/16/2013   Td 09/02/2010   Zoster Recombinat (Shingrix) 12/23/2021, 03/23/2022    Health Maintenance Due  Topic Date Due   DTaP/Tdap/Td (2 - Tdap) 09/02/2020   COVID-19 Vaccine (6 - 2023-24 season) 06/21/2022   Tetanus shot  Needs update     Prostate health Lab Results  Component Value Date   PSA1 3.6 05/10/2022   PSA1 4.3 (H) 02/03/2022   PSA 3.61 01/09/2023   PSA 3.83 01/04/2022   PSA 2.41 01/01/2021   3.61 Seldom nocturia  No change in urine stream  Saw urology in [place] last summer    Colon cancer screening : colonoscopy in 2020   Bone health  Falls- none  Fractures-none  Supplements - mvi  Exercise - golf , works outside a lot      Mood    09/12/2022    3:12  PM 08/23/2022    8:31 AM 06/01/2022    3:25 PM 01/13/2022    8:25 AM 01/20/2021    2:09 PM  Depression screen PHQ 2/9  Decreased Interest 0 0 0 0 0  Down, Depressed, Hopeless 0 0 0 0 0  PHQ - 2 Score 0 0 0 0 0  Altered sleeping 0 0 0  0  Tired, decreased energy 0 0 0  0  Change in appetite 0 0 0  0  Feeling bad or failure about yourself  0 0 0  0  Trouble concentrating 0 0 0  0  Moving slowly or fidgety/restless 0 0 0  0  Suicidal thoughts 0 0 0  0  PHQ-9 Score 0 0 0  0  Difficult doing work/chores Not difficult at all Not difficult at all Not difficult at all  Not difficult at all    HTN bp is stable today  No cp or palpitations or headaches or edema  No side effects to medicines  BP Readings from Last 3 Encounters:  01/16/23 136/80  09/12/22 125/80  08/23/22 128/62    Coreg 6.25 mg bid Altace 10 mg daily History of CAD and aortic athero  Takes plavix  GERD Takes protonix   Lab Results  Component Value Date   VITAMINB12 375 01/09/2023     Lab Results  Component Value Date   NA 138 01/09/2023   K 4.8 01/09/2023  CO2 29 01/09/2023   GLUCOSE 92 01/09/2023   BUN 22 01/09/2023   CREATININE 1.12 01/09/2023   CALCIUM 9.2 01/09/2023   GFR 64.39 01/09/2023   GFRNONAA >60 09/27/2018    Hyperlipidemia Lab Results  Component Value Date   CHOL 127 01/09/2023   CHOL 122 01/04/2022   CHOL 120 01/01/2021   Lab Results  Component Value Date   HDL 40.80 01/09/2023   HDL 44.60 01/04/2022   HDL 46.10 01/01/2021   Lab Results  Component Value Date   LDLCALC 56 01/09/2023   LDLCALC 51 01/04/2022   LDLCALC 60 01/01/2021   Lab Results  Component Value Date   TRIG 155.0 (H) 01/09/2023   TRIG 134.0 01/04/2022   TRIG 69.0 01/01/2021   Lab Results  Component Value Date   CHOLHDL 3 01/09/2023   CHOLHDL 3 01/04/2022   CHOLHDL 3 01/01/2021   Lab Results  Component Value Date   LDLDIRECT 76.5 09/02/2010   Atorvastatin 40 mg daily   Lab Results  Component  Value Date   ALT 17 01/09/2023   AST 19 01/09/2023   ALKPHOS 112 01/09/2023   BILITOT 0.8 01/09/2023   Lab Results  Component Value Date   WBC 6.7 01/09/2023   HGB 14.6 01/09/2023   HCT 44.9 01/09/2023   MCV 94.0 01/09/2023   PLT 212.0 01/09/2023   Lab Results  Component Value Date   TSH 3.01 01/09/2023     Patient Active Problem List   Diagnosis Date Noted   Current use of proton pump inhibitor 01/08/2023   Candida, oral 03/08/2022   Tinnitus of right ear 10/08/2021   Hearing loss sensory, bilateral 10/07/2021   Seasonal allergic rhinitis due to pollen 10/07/2021   Sensorineural hearing loss (SNHL) of both ears 10/31/2019   Aortic atherosclerosis ([facility]) 09/30/2018   ETD (eustachian tube dysfunction) 10/05/2016   Routine general medical examination at a health care facility 11/17/2015   Coronary artery disease involving native coronary artery without angina pectoris 04/09/2015   History of heart artery stent 01/01/2015   History of colitis 11/08/2014   DEGENERATIVE JOINT DISEASE 07/30/2009   Elevated PSA 07/30/2009   HEARING LOSS, MILD 11/27/2007   IRRITABLE BOWEL SYNDROME 11/27/2007   Hyperlipidemia 08/13/2007   Essential hypertension 08/13/2007   GERD 08/13/2007   Past Medical History:  Diagnosis Date   Colitis    Coronary artery disease    DJD (degenerative joint disease)    Gallbladder disease    GERD (gastroesophageal reflux disease)    Hearing loss    Heart murmur    Hyperlipidemia    Hypertension    IBS (irritable bowel syndrome)    Increased prostate specific antigen (PSA) velocity    Past Surgical History:  Procedure Laterality Date   CARDIAC CATHETERIZATION     CARDIAC CATHETERIZATION N/A 01/07/2015   Procedure: Left Heart Cath and Coronary Angiography;  Surgeon: Iran Ouch, MD;  Location: [facility];  Service: Cardiovascular;  Laterality: N/A;   CARDIAC CATHETERIZATION N/A 01/07/2015   Procedure: Coronary Stent Intervention;   Surgeon: Iran Ouch, MD;  Location: [facility];  Service: Cardiovascular;  Laterality: N/A;   COLONOSCOPY     CORONARY STENT PLACEMENT     Dr. Juanda Chance   CORONARY STENT PLACEMENT  01/07/2015   LAD   TONSILLECTOMY AND ADENOIDECTOMY     in the 2nd grade   Social History   Tobacco Use   Smoking status: Never   Smokeless tobacco: Never  Vaping Use   Vaping Use: Never used  Substance Use Topics   Alcohol use: Yes    Alcohol/week: 0.0 standard drinks of alcohol    Comment: rarely drinks beer/approx. 1 case per year   Drug use: No   Family History  Problem Relation Age of Onset   Stroke Mother    Colon cancer Neg Hx    Esophageal cancer Neg Hx    Rectal cancer Neg Hx    Stomach cancer Neg Hx    Prostate cancer Neg Hx    Bladder Cancer Neg Hx    Kidney cancer Neg Hx    Allergies  Allergen Reactions   Ciprofloxacin Other (See Comments)    Jittery, uneasy feeling.   Doxycycline     Headaches    Flagyl [Metronidazole]    Current Outpatient Medications on File Prior to Visit  Medication Sig Dispense Refill   aspirin EC 81 MG tablet Take 1 tablet (81 mg total) by mouth daily.     carvedilol (COREG) 6.25 MG tablet Take 1 tablet (6.25 mg total) by mouth 2 (two) times daily. 180 tablet 3   clopidogrel (PLAVIX) 75 MG tablet TAKE 1 TABLET BY MOUTH EVERY DAY 90 tablet 3   Multiple Vitamins-Minerals (MULTIVITAMIN & MINERAL PO) Take 1 tablet by mouth daily.     nitroGLYCERIN (NITROSTAT) 0.4 MG SL tablet PLACE 1 TABLET UNDER THE TONGUE EVERY 5 MINUTES AS NEEDED FOR CHEST PAIN. 25 tablet 1   pantoprazole (PROTONIX) 40 MG tablet Take 1 tablet (40 mg total) by mouth daily. 90 tablet 3   polyethylene glycol (MIRALAX) packet Take 17 g by mouth daily. 14 each 0   No current facility-administered medications on file prior to visit.    Review of Systems  Constitutional:  Negative for activity change, appetite change, fatigue, fever and unexpected weight change.  HENT:  Positive  for ear pain. Negative for congestion, rhinorrhea, sore throat and trouble swallowing.        Right ear fullness and occational drainage   Eyes:  Negative for pain, redness, itching and visual disturbance.  Respiratory:  Negative for cough, chest tightness, shortness of breath and wheezing.   Cardiovascular:  Negative for chest pain and palpitations.  Gastrointestinal:  Negative for abdominal pain, blood in stool, constipation, diarrhea and nausea.  Endocrine: Negative for cold intolerance, heat intolerance, polydipsia and polyuria.  Genitourinary:  Negative for difficulty urinating, dysuria, frequency and urgency.  Musculoskeletal:  Negative for arthralgias, joint swelling and myalgias.  Skin:  Negative for pallor and rash.  Neurological:  Negative for dizziness, tremors, weakness, numbness and headaches.  Hematological:  Negative for adenopathy. Does not bruise/bleed easily.  Psychiatric/Behavioral:  Negative for decreased concentration and dysphoric mood. The patient is not nervous/anxious.        Objective:   Physical Exam Constitutional:      General: He is not in acute distress.    Appearance: Normal appearance. He is well-developed. He is not ill-appearing or diaphoretic.     Comments: Overweight   HENT:     Head: Normocephalic and atraumatic.     Right Ear: Tympanic membrane, ear canal and external ear normal.     Left Ear: Tympanic membrane, ear canal and external ear normal.     Nose: Nose normal. No congestion.     Mouth/Throat:     Mouth: Mucous membranes are moist.     Pharynx: Oropharynx is clear. No posterior oropharyngeal erythema.  Eyes:     General: No  scleral icterus.       Right eye: No discharge.        Left eye: No discharge.     Conjunctiva/sclera: Conjunctivae normal.     Pupils: Pupils are equal, round, and reactive to light.  Neck:     Thyroid: No thyromegaly.     Vascular: No carotid bruit or JVD.  Cardiovascular:     Rate and Rhythm: Normal rate and  regular rhythm.     Pulses: Normal pulses.     Heart sounds: Normal heart sounds.     No gallop.  Pulmonary:     Effort: Pulmonary effort is normal. No respiratory distress.     Breath sounds: Normal breath sounds. No wheezing or rales.     Comments: Good air exch Chest:     Chest wall: No tenderness.  Abdominal:     General: Bowel sounds are normal. There is no distension or abdominal bruit.     Palpations: Abdomen is soft. There is no mass.     Tenderness: There is no abdominal tenderness.     Hernia: No hernia is present.  Musculoskeletal:        General: No tenderness.     Cervical back: Normal range of motion and neck supple. No rigidity. No muscular tenderness.     Right lower leg: No edema.     Left lower leg: No edema.  Lymphadenopathy:     Cervical: No cervical adenopathy.  Skin:    General: Skin is warm and dry.     Coloration: Skin is not pale.     Findings: No erythema or rash.     Comments: Solar lentigines diffusely   Neurological:     Mental Status: He is alert.     Cranial Nerves: No cranial nerve deficit.     Motor: No abnormal muscle tone.     Coordination: Coordination normal.     Gait: Gait normal.     Deep Tendon Reflexes: Reflexes are normal and symmetric. Reflexes normal.  Psychiatric:        Mood and Affect: Mood normal.        Cognition and Memory: Cognition normal.           Assessment & Plan:   Problem List Items Addressed This Visit       Cardiovascular and Mediastinum   Essential hypertension    bp in fair control at this time  BP Readings from Last 1 Encounters:  01/16/23 136/80  No changes needed Most recent labs reviewed  Disc lifstyle change with low sodium diet and exercise    Coreg 6.25 mg bid  altace 10 mg daily        Relevant Medications   atorvastatin (LIPITOR) 40 MG tablet   ramipril (ALTACE) 10 MG capsule   Aortic atherosclerosis (HCC)    Cholesterol and blood pressure are controlled No symptoms         Relevant Medications   atorvastatin (LIPITOR) 40 MG tablet   ramipril (ALTACE) 10 MG capsule     Digestive   GERD    Continues protonix  Vit B12 level is in the normal range        Other   Routine general medical examination at a health care facility - Primary    Reviewed health habits including diet and exercise and skin cancer prevention Reviewed appropriate screening tests for age  Also reviewed health mt list, fam hx and immunization status , as well as social and family  history   See HPI Labs reviewed and ordered Plans to check into Td at pharmacy  Psa is improved  Colonoscopy utd 2020  No falls or fractures PHQ score of 0       Hyperlipidemia    Disc goals for lipids and reasons to control them Rev last labs with pt Rev low sat fat diet in detail LDL controlled at 56  Plan to continue atorvastatin 40 mg daily       Relevant Medications   atorvastatin (LIPITOR) 40 MG tablet   ramipril (ALTACE) 10 MG capsule   Elevated PSA    Lab Results  Component Value Date   PSA1 3.6 05/10/2022   PSA1 4.3 (H) 02/03/2022   PSA 3.61 01/09/2023   PSA 3.83 01/04/2022   PSA 2.41 01/01/2021   Saw urology last summer  Psa today is improved No voiding symptoms at all      Current use of proton pump inhibitor    Lab Results  Component Value Date   VITAMINB12 375 01/09/2023  Will continue to follow

## 2023-01-16 NOTE — Assessment & Plan Note (Signed)
Lab Results  Component Value Date   VITAMINB12 375 01/09/2023   Will continue to follow

## 2023-01-16 NOTE — Assessment & Plan Note (Signed)
bp in fair control at this time  BP Readings from Last 1 Encounters:  01/16/23 136/80   No changes needed Most recent labs reviewed  Disc lifstyle change with low sodium diet and exercise    Coreg 6.25 mg bid  altace 10 mg daily

## 2023-01-16 NOTE — Assessment & Plan Note (Signed)
Cholesterol and blood pressure are controlled No symptoms

## 2023-02-03 ENCOUNTER — Other Ambulatory Visit: Payer: Self-pay | Admitting: Family Medicine

## 2023-02-09 DIAGNOSIS — Z01118 Encounter for examination of ears and hearing with other abnormal findings: Secondary | ICD-10-CM | POA: Diagnosis not present

## 2023-02-09 DIAGNOSIS — H90A22 Sensorineural hearing loss, unilateral, left ear, with restricted hearing on the contralateral side: Secondary | ICD-10-CM | POA: Diagnosis not present

## 2023-02-09 DIAGNOSIS — Z9622 Myringotomy tube(s) status: Secondary | ICD-10-CM | POA: Diagnosis not present

## 2023-02-09 DIAGNOSIS — H903 Sensorineural hearing loss, bilateral: Secondary | ICD-10-CM | POA: Diagnosis not present

## 2023-02-09 DIAGNOSIS — H90A31 Mixed conductive and sensorineural hearing loss, unilateral, right ear with restricted hearing on the contralateral side: Secondary | ICD-10-CM | POA: Diagnosis not present

## 2023-02-09 DIAGNOSIS — H9193 Unspecified hearing loss, bilateral: Secondary | ICD-10-CM | POA: Diagnosis not present

## 2023-02-09 DIAGNOSIS — Z4589 Encounter for adjustment and management of other implanted devices: Secondary | ICD-10-CM | POA: Diagnosis not present

## 2023-02-09 DIAGNOSIS — H6991 Unspecified Eustachian tube disorder, right ear: Secondary | ICD-10-CM | POA: Diagnosis not present

## 2023-03-07 ENCOUNTER — Other Ambulatory Visit: Payer: Self-pay | Admitting: Cardiovascular Disease

## 2023-05-04 ENCOUNTER — Other Ambulatory Visit: Payer: Self-pay | Admitting: Cardiovascular Disease

## 2023-05-11 ENCOUNTER — Other Ambulatory Visit: Payer: Self-pay | Admitting: Family Medicine

## 2023-05-17 DIAGNOSIS — L720 Epidermal cyst: Secondary | ICD-10-CM | POA: Diagnosis not present

## 2023-05-17 DIAGNOSIS — L02212 Cutaneous abscess of back [any part, except buttock]: Secondary | ICD-10-CM | POA: Diagnosis not present

## 2023-06-05 ENCOUNTER — Ambulatory Visit (INDEPENDENT_AMBULATORY_CARE_PROVIDER_SITE_OTHER): Payer: Medicare HMO

## 2023-06-05 VITALS — Ht 68.0 in | Wt 192.0 lb

## 2023-06-05 DIAGNOSIS — Z0000 Encounter for general adult medical examination without abnormal findings: Secondary | ICD-10-CM | POA: Diagnosis not present

## 2023-06-05 NOTE — Patient Instructions (Signed)
Mr. Verma , Thank you for taking time to come for your Medicare Wellness Visit. I appreciate your ongoing commitment to your health goals. Please review the following plan we discussed and let me know if I can assist you in the future.   Referrals/Orders/Follow-Ups/Clinician Recommendations: none  This is a list of the screening recommended for you and due dates:  Health Maintenance  Topic Date Due   DTaP/Tdap/Td vaccine (2 - Tdap) 09/02/2020   COVID-19 Vaccine (7 - 2023-24 season) 06/19/2023   Medicare Annual Wellness Visit  06/04/2024   Pneumonia Vaccine  Completed   Flu Shot  Completed   Hepatitis C Screening  Completed   Zoster (Shingles) Vaccine  Completed   HPV Vaccine  Aged Out    Advanced directives: (Declined) Advance directive discussed with you today. Even though you declined this today, please call our office should you change your mind, and we can give you the proper paperwork for you to fill out.  Next Medicare Annual Wellness Visit scheduled for next year: Yes 06/05/24 @ 2:20pm telephone

## 2023-06-05 NOTE — Progress Notes (Signed)
Subjective:   Brian RINEER Sr. is a 77 y.o. male who presents for Medicare Annual/Subsequent preventive examination.  Visit Complete: Virtual I connected with  Brian Prows Sr. on 06/05/23 by a audio enabled telemedicine application and verified that I am speaking with the correct person using two identifiers.  Patient Location: Home  Provider Location: Home Office  I discussed the limitations of evaluation and management by telemedicine. The patient expressed understanding and agreed to proceed.  Vital Signs: Because this visit was a virtual/telehealth visit, some criteria may be missing or patient reported. Any vitals not documented were not able to be obtained and vitals that have been documented are patient reported.  Patient Medicare AWV questionnaire was completed by the patient on (not done); I have confirmed that all information answered by patient is correct and no changes since this date.  Cardiac Risk Factors include: advanced age (>40men, >58 women);dyslipidemia;hypertension;male gender    Objective:    Today's Vitals   06/05/23 1335  Weight: 192 lb (87.1 kg)  Height: 5\' 8"  (1.727 m)   Body mass index is 29.19 kg/m.     06/05/2023    1:44 PM 06/01/2022    3:26 PM 01/20/2021    2:07 PM 01/02/2020   10:35 AM 01/01/2019    8:48 AM 09/27/2018    6:20 PM 12/12/2017    9:15 AM  Advanced Directives  Does Patient Have a Medical Advance Directive? No No No No No No No  Does patient want to make changes to medical advance directive?   Yes (MAU/Ambulatory/Procedural Areas - Information given)      Would patient like information on creating a medical advance directive?  No - Patient declined  No - Patient declined No - Patient declined No - Patient declined Yes (MAU/Ambulatory/Procedural Areas - Information given)    Current Medications (verified) Outpatient Encounter Medications as of 06/05/2023  Medication Sig   aspirin EC 81 MG tablet Take 1 tablet (81 mg total) by  mouth daily.   atorvastatin (LIPITOR) 40 MG tablet Take 1 tablet (40 mg total) by mouth daily.   carvedilol (COREG) 6.25 MG tablet TAKE 1 TABLET BY MOUTH TWICE A DAY   clopidogrel (PLAVIX) 75 MG tablet Take 1 tablet (75 mg total) by mouth daily. PLEASE CALL [phone] TO SCHEDULE YEARLY FOLLOW UP VISIT. THANK YOU.   Multiple Vitamins-Minerals (MULTIVITAMIN & MINERAL PO) Take 1 tablet by mouth daily.   nitroGLYCERIN (NITROSTAT) 0.4 MG SL tablet PLACE 1 TABLET UNDER THE TONGUE EVERY 5 MINUTES AS NEEDED FOR CHEST PAIN.   pantoprazole (PROTONIX) 40 MG tablet TAKE 1 TABLET BY MOUTH EVERY DAY   polyethylene glycol (MIRALAX) packet Take 17 g by mouth daily.   ramipril (ALTACE) 10 MG capsule Take 1 capsule (10 mg total) by mouth daily.   No facility-administered encounter medications on file as of 06/05/2023.    Allergies (verified) Ciprofloxacin, Doxycycline, and Flagyl [metronidazole]   History: Past Medical History:  Diagnosis Date   Colitis    Coronary artery disease    DJD (degenerative joint disease)    Gallbladder disease    GERD (gastroesophageal reflux disease)    Hearing loss    Heart murmur    Hyperlipidemia    Hypertension    IBS (irritable bowel syndrome)    Increased prostate specific antigen (PSA) velocity    Past Surgical History:  Procedure Laterality Date   CARDIAC CATHETERIZATION     CARDIAC CATHETERIZATION N/A 01/07/2015   Procedure: Left Heart  Cath and Coronary Angiography;  Surgeon: Iran Ouch, MD;  [facility]: [facility];  Service: Cardiovascular;  Laterality: N/A;   CARDIAC CATHETERIZATION N/A 01/07/2015   Procedure: Coronary Stent Intervention;  Surgeon: Iran Ouch, MD;  Location: [facility];  Service: Cardiovascular;  Laterality: N/A;   COLONOSCOPY     CORONARY STENT PLACEMENT     Dr. Juanda Chance   CORONARY STENT PLACEMENT  01/07/2015   LAD   TONSILLECTOMY AND ADENOIDECTOMY     in the 2nd grade   Family History  Problem Relation  Age of Onset   Stroke Mother    Colon cancer Neg Hx    Esophageal cancer Neg Hx    Rectal cancer Neg Hx    Stomach cancer Neg Hx    Prostate cancer Neg Hx    Bladder Cancer Neg Hx    Kidney cancer Neg Hx    Social History   Socioeconomic History   Marital status: Married    Spouse name: Brian Todd   Number of children: 2   Years of education: Not on file   Highest education level: Not on file  Occupational History   Occupation: Public relations account executive    Comment: Retired  Tobacco Use   Smoking status: Never   Smokeless tobacco: Never  Vaping Use   Vaping status: Never Used  Substance and Sexual Activity   Alcohol use: Yes    Alcohol/week: 0.0 standard drinks of alcohol    Comment: rarely drinks beer/approx. 1 case per year   Drug use: No   Sexual activity: Yes  Other Topics Concern   Not on file  Social History Narrative   Not on file   Social Determinants of Health   Financial Resource Strain: Low Risk  (06/01/2022)   Overall Financial Resource Strain (CARDIA)    Difficulty of Paying Living Expenses: Not hard at all  Food Insecurity: No Food Insecurity (06/01/2022)   Hunger Vital Sign    Worried About Running Out of Food in the Last Year: Never true    Ran Out of Food in the Last Year: Never true  Transportation Needs: No Transportation Needs (06/01/2022)   PRAPARE - Administrator, Civil Service (Medical): No    Lack of Transportation (Non-Medical): No  Physical Activity: Sufficiently Active (06/05/2023)   Exercise Vital Sign    Days of Exercise per Week: 5 days    Minutes of Exercise per Session: 60 min  Stress: No Stress Concern Present (06/05/2023)   [employer] of Occupational Health - Occupational Stress Questionnaire    Feeling of Stress : Not at all  Social Connections: Moderately Integrated (06/01/2022)   Social Connection and Isolation Panel [NHANES]    Frequency of Communication with Friends and Family: Twice a week    Frequency of  Social Gatherings with Friends and Family: More than three times a week    Attends Religious Services: More than 4 times per year    Active Member of [employer] or Organizations: No    Attends Engineer, structural: Never    Marital Status: Married    Tobacco Counseling Counseling given: Not Answered   Clinical Intake:  Pre-visit preparation completed: No  Pain : No/denies pain     BMI - recorded: 29.19 Nutritional Status: BMI 25 -29 Overweight Nutritional Risks: None Diabetes: No  How often do you need to have someone help you when you read instructions, pamphlets, or other written materials from your doctor or  pharmacy?: 1 - Never  Interpreter Needed?: No  Comments: lives with wife Information entered by :: B.Myrlene Riera,LPN   Activities of Daily Living    06/05/2023    1:44 PM  In your present state of health, do you have any difficulty performing the following activities:  Hearing? 1  Vision? 0  Difficulty concentrating or making decisions? 0  Walking or climbing stairs? 0  Dressing or bathing? 0  Doing errands, shopping? 0  Preparing Food and eating ? N  Using the Toilet? N  In the past six months, have you accidently leaked urine? N  Do you have problems with loss of bowel control? N  Managing your Medications? N  Managing your Finances? N  Housekeeping or managing your Housekeeping? N    Patient Care Team: Tower, Audrie Gallus, MD as PCP - General (Family Medicine) Iran Ouch, MD as PCP - Cardiology (Cardiology) Loletha Carrow, MD as Consulting Physician (Ophthalmology) Iran Ouch, MD as Consulting Physician (Cardiology)  Indicate any recent Medical Services you may have received from other than [facility] providers in the past year (date may be approximate).     Assessment:   This is a routine wellness examination for Caiman.  Hearing/Vision screen Hearing Screening - Comments:: Pt says his hearing is good:some loss Vision Screening -  Comments:: Pt says good vision;glasses for reading Dr Emily Filbert   Goals Addressed             This Visit's Progress    DIET - EAT MORE FRUITS AND VEGETABLES   On track    Patient Stated   On track    Starting 01/01/2019, I will continue to remain active by doing yard work as needed.      Patient Stated   On track    01/02/2020, I will continue to play golf 3 days a week for about 1-2 hours.      Patient Stated   On track    01/20/2021, I will maintain and continue medications as prescribed.        Depression Screen    06/05/2023    1:42 PM 09/12/2022    3:12 PM 08/23/2022    8:31 AM 06/01/2022    3:25 PM 01/13/2022    8:25 AM 01/20/2021    2:09 PM 01/08/2021    9:30 AM  PHQ 2/9 Scores  PHQ - 2 Score 0 0 0 0 0 0 0  PHQ- 9 Score  0 0 0  0 0    Fall Risk    06/05/2023    1:39 PM 09/12/2022    3:12 PM 08/23/2022    8:31 AM 06/01/2022    3:27 PM 01/20/2021    2:08 PM  Fall Risk   Falls in the past year? 0 0 0 0 0  Number falls in past yr: 0 0 0 0 0  Injury with Fall? 0 0 0 0 0  Risk for fall due to : No Fall Risks No Fall Risks No Fall Risks No Fall Risks Medication side effect  Follow up Education provided;Falls prevention discussed Falls evaluation completed Falls evaluation completed Falls prevention discussed Falls evaluation completed;Falls prevention discussed    MEDICARE RISK AT HOME: Medicare Risk at Home Any stairs in or around the home?: Yes If so, are there any without handrails?: Yes Home free of loose throw rugs in walkways, pet beds, electrical cords, etc?: Yes Adequate lighting in your home to reduce risk of falls?: Yes Life alert?: No Use of  a cane, walker or w/c?: No Grab bars in the bathroom?: No Shower chair or bench in shower?: Yes Elevated toilet seat or a handicapped toilet?: Yes  TIMED UP AND GO:  Was the test performed?  No    Cognitive Function:    01/20/2021    2:17 PM 01/02/2020   10:46 AM 01/01/2019    8:48 AM 12/12/2017    9:14 AM 12/01/2016    12:35 PM  MMSE - Mini Mental State Exam  Orientation to time 5 5 5 5 5   Orientation to Place 5 5 5 5 5   Registration 3 3 3 3 3   Attention/ Calculation 5 5 0 0 0  Recall 3 3 3 2 3   Recall-comments    unable to recall 1 of 3 words   Language- name 2 objects   0 0 0  Language- repeat 1 1 1 1 1   Language- follow 3 step command   0 3 3  Language- read & follow direction   0 0 0  Write a sentence   0 0 0  Copy design   0 0 0  Total score   17 19 20         06/05/2023    1:47 PM 06/01/2022    3:29 PM  6CIT Screen  What Year? 0 points 0 points  What month? 0 points 0 points  What time? 0 points 0 points  Count back from 20 0 points 0 points  Months in reverse 0 points 0 points  Repeat phrase 0 points 0 points  Total Score 0 points 0 points    Immunizations Immunization History  Administered Date(s) Administered   Fluad Quad(high Dose 65+) 03/28/2019, 05/07/2022   Influenza Split 04/16/2011, 04/26/2012   Influenza Whole 03/25/2009, 03/25/2010   Influenza, High Dose Seasonal PF 05/13/2017, 04/11/2018, 04/09/2020, 03/25/2023   Influenza, Seasonal, Injecte, Preservative Fre 04/28/2016   Influenza,inj,Quad PF,6+ Mos 04/10/2013, 04/02/2014, 05/04/2015   Influenza-Unspecified 05/13/2017   PFIZER Comirnaty(Gray Top)Covid-19 Tri-Sucrose Vaccine 04/26/2022   PFIZER(Purple Top)SARS-COV-2 Vaccination 09/09/2019, 10/09/2019, 05/21/2020, 06/27/2021   Pfizer(Comirnaty)Fall Seasonal Vaccine 12 years and older 04/24/2023   Pneumococcal Conjugate-13 10/31/2014   Pneumococcal Polysaccharide-23 09/16/2013   Td 09/02/2010   Zoster Recombinant(Shingrix) 12/23/2021, 03/23/2022    TDAP status: Up to date  Flu Vaccine status: Up to date  Pneumococcal vaccine status: Up to date  Covid-19 vaccine status: Completed vaccines  Qualifies for Shingles Vaccine? Yes   Zostavax completed Yes   Shingrix Completed?: Yes  Screening Tests Health Maintenance  Topic Date Due   DTaP/Tdap/Td (2 -  Tdap) 09/02/2020   COVID-19 Vaccine (7 - 2023-24 season) 06/19/2023   Medicare Annual Wellness (AWV)  06/04/2024   Pneumonia Vaccine 70+ Years old  Completed   INFLUENZA VACCINE  Completed   Hepatitis C Screening  Completed   Zoster Vaccines- Shingrix  Completed   HPV VACCINES  Aged Out    Health Maintenance  Health Maintenance Due  Topic Date Due   DTaP/Tdap/Td (2 - Tdap) 09/02/2020    Colorectal cancer screening: No longer required.   Lung Cancer Screening: (Low Dose CT Chest recommended if Age 74-80 years, 20 pack-year currently smoking OR have quit w/in 15years.) does not qualify.   Lung Cancer Screening Referral: no  Additional Screening:  Hepatitis C Screening: does not qualify; Completed 12/01/2015  Vision Screening: Recommended annual ophthalmology exams for early detection of glaucoma and other disorders of the eye. Is the patient up to date with their annual eye exam?  Yes  Who is the provider or what is the name of the office in which the patient attends annual eye exams? Dr Salli Real If pt is not established with a provider, would they like to be referred to a provider to establish care? No .   Dental Screening: Recommended annual dental exams for proper oral hygiene  Diabetic Foot Exam: n/a  Community Resource Referral / Chronic Care Management: CRR required this visit?  No   CCM required this visit?  No    Plan:     I have personally reviewed and noted the following in the patient's chart:   Medical and social history Use of alcohol, tobacco or illicit drugs  Current medications and supplements including opioid prescriptions. Patient is not currently taking opioid prescriptions. Functional ability and status Nutritional status Physical activity Advanced directives List of other physicians Hospitalizations, surgeries, and ER visits in previous 12 months Vitals Screenings to include cognitive, depression, and falls Referrals and  appointments  In addition, I have reviewed and discussed with patient certain preventive protocols, quality metrics, and best practice recommendations. A written personalized care plan for preventive services as well as general preventive health recommendations were provided to patient.     Sue Lush, LPN   16/04/9603   After Visit Summary: (MyChart) Due to this being a telephonic visit, the after visit summary with patients personalized plan was offered to patient via MyChart   Nurse Notes: The patient states he is doing well and has no concerns or questions at this time.

## 2023-06-30 ENCOUNTER — Encounter: Payer: Self-pay | Admitting: Cardiovascular Disease

## 2023-06-30 ENCOUNTER — Ambulatory Visit: Payer: Medicare HMO | Attending: Cardiovascular Disease | Admitting: Cardiovascular Disease

## 2023-06-30 VITALS — BP 124/70 | HR 61 | Ht 68.0 in | Wt 195.1 lb

## 2023-06-30 DIAGNOSIS — I10 Essential (primary) hypertension: Secondary | ICD-10-CM

## 2023-06-30 DIAGNOSIS — E785 Hyperlipidemia, unspecified: Secondary | ICD-10-CM | POA: Diagnosis not present

## 2023-06-30 DIAGNOSIS — I2510 Atherosclerotic heart disease of native coronary artery without angina pectoris: Secondary | ICD-10-CM | POA: Diagnosis not present

## 2023-06-30 MED ORDER — CARVEDILOL 6.25 MG PO TABS: 6.2500 mg | ORAL_TABLET | Freq: Two times a day (BID) | ORAL | 3 refills | Status: DC

## 2023-06-30 MED ORDER — CLOPIDOGREL BISULFATE 75 MG PO TABS: 75.0000 mg | ORAL_TABLET | Freq: Every day | ORAL | 3 refills | Status: DC

## 2023-06-30 NOTE — Patient Instructions (Signed)
 Medication Instructions:  No changes *If you need a refill on your cardiac medications before your next appointment, please call your pharmacy*   Lab Work: None ordered If you have labs (blood work) drawn today and your tests are completely normal, you will receive your results only by: MyChart Message (if you have MyChart) OR A paper copy in the mail If you have any lab test that is abnormal or we need to change your treatment, we will call you to review the results.   Testing/Procedures: None ordered   Follow-Up: At [facility], you and your health needs are our priority.  As part of our continuing mission to provide you with exceptional heart care, we have created designated Provider Care Teams.  These Care Teams include your primary Cardiologist (physician) and Advanced Practice Providers (APPs -  Physician Assistants and Nurse Practitioners) who all work together to provide you with the care you need, when you need it.  We recommend signing up for the patient portal called "MyChart".  Sign up information is provided on this After Visit Summary.  MyChart is used to connect with patients for Virtual Visits (Telemedicine).  Patients are able to view lab/test results, encounter notes, upcoming appointments, etc.  Non-urgent messages can be sent to your provider as well.   To learn more about what you can do with MyChart, go to [email or web address].    Your next appointment:   12 month(s)  Provider:   You may see Lorine Bears, MD or one of the following Advanced Practice Providers on your designated Care Team:   Nicolasa Ducking, NP Eula Listen, PA-C Cadence Fransico Michael, PA-C Charlsie Quest, NP Carlos Levering, NP

## 2023-06-30 NOTE — Progress Notes (Signed)
Cardiology Office Note   Date:  06/30/2023   ID:  Brian Prows Sr., DOB Feb 18, 1946, MRN [id number]  PCP:  Judy Pimple, MD  Cardiologist:   Lorine Bears, MD   Chief Complaint  Patient presents with   Follow-up    12 Month f/u no complaints today. Meds reviewed verbally with pt.      History of Present Illness: Brian Todd. is a 77 y.o. male who presents for a follow-up visit  regarding coronary artery disease. He had a small non-ST elevation myocardial infarction in 2001. Cardiac catheterization showed a 95% mid LAD stenosis and 40% stenosis in OM branch. He had successful angioplasty and Taxus drug-eluting stent placement to the mid LAD without complications.  He had recurrent angina in June 2016. Cardiac catheterization showed 99% in-stent restenosis in the mid LAD extending to the distal edge, 60% mid left circumflex stenosis and mild RCA disease. I performed successful angioplasty and drug-eluting stent placement to the mid LAD.  He has other chronic medical conditions that include hypertension, hyperlipidemia and gastroesophageal reflux disease.  He has been doing very well with no chest pain, shortness of breath or palpitations.   Past Medical History:  Diagnosis Date   Colitis    Coronary artery disease    DJD (degenerative joint disease)    Gallbladder disease    GERD (gastroesophageal reflux disease)    Hearing loss    Heart murmur    Hyperlipidemia    Hypertension    IBS (irritable bowel syndrome)    Increased prostate specific antigen (PSA) velocity     Past Surgical History:  Procedure Laterality Date   CARDIAC CATHETERIZATION     CARDIAC CATHETERIZATION N/A 01/07/2015   Procedure: Left Heart Cath and Coronary Angiography;  Surgeon: Iran Ouch, MD;  Location: [facility];  Service: Cardiovascular;  Laterality: N/A;   CARDIAC CATHETERIZATION N/A 01/07/2015   Procedure: Coronary Stent Intervention;  Surgeon: Iran Ouch, MD;   Location: [facility];  Service: Cardiovascular;  Laterality: N/A;   COLONOSCOPY     CORONARY STENT PLACEMENT     Dr. Juanda Chance   CORONARY STENT PLACEMENT  01/07/2015   LAD   TONSILLECTOMY AND ADENOIDECTOMY     in the 2nd grade     Current Outpatient Medications  Medication Sig Dispense Refill   aspirin EC 81 MG tablet Take 1 tablet (81 mg total) by mouth daily.     atorvastatin (LIPITOR) 40 MG tablet Take 1 tablet (40 mg total) by mouth daily. 90 tablet 3   Multiple Vitamins-Minerals (MULTIVITAMIN & MINERAL PO) Take 1 tablet by mouth daily.     nitroGLYCERIN (NITROSTAT) 0.4 MG SL tablet PLACE 1 TABLET UNDER THE TONGUE EVERY 5 MINUTES AS NEEDED FOR CHEST PAIN. 25 tablet 1   pantoprazole (PROTONIX) 40 MG tablet TAKE 1 TABLET BY MOUTH EVERY DAY 90 tablet 1   polyethylene glycol (MIRALAX) packet Take 17 g by mouth daily. 14 each 0   ramipril (ALTACE) 10 MG capsule Take 1 capsule (10 mg total) by mouth daily. 90 capsule 3   carvedilol (COREG) 6.25 MG tablet Take 1 tablet (6.25 mg total) by mouth 2 (two) times daily. 180 tablet 3   clopidogrel (PLAVIX) 75 MG tablet Take 1 tablet (75 mg total) by mouth daily. 90 tablet 3   No current facility-administered medications for this visit.    Allergies:   Ciprofloxacin, Doxycycline, and Flagyl [metronidazole]    Social  History:  The patient  reports that he has never smoked. He has never used smokeless tobacco. He reports current alcohol use. He reports that he does not use drugs.   Family History:  The patient's family history includes Stroke in his mother.    ROS:  Please see the history of present illness.   Otherwise, review of systems are positive for none.   All other systems are reviewed and negative.    PHYSICAL EXAM: VS:  BP 124/70 (BP Location: Left Arm, Patient Position: Sitting, Cuff Size: Normal)   Pulse 61   Ht 5\' 8"  (1.727 m)   Wt 195 lb 2 oz (88.5 kg)   SpO2 97%   BMI 29.67 kg/m  , BMI Body mass index is 29.67  kg/m. GEN: Well nourished, well developed, in no acute distress  HEENT: normal  Neck: no JVD, carotid bruits, or masses Cardiac: RRR; no murmurs, rubs, or gallops,no edema  Respiratory:  clear to auscultation bilaterally, normal work of breathing GI: soft, nontender, nondistended, + BS MS: no deformity or atrophy  Skin: warm and dry, no rash Neuro:  Strength and sensation are intact Psych: euthymic mood, full affect   EKG:  EKG is ordered today. The ekg ordered today demonstrates: Normal sinus rhythm with sinus arrhythmia Normal ECG When compared with ECG of 12-Jan-2015 11:21, No significant change was found    Recent Labs: 01/09/2023: ALT 17; BUN 22; Creatinine, Ser 1.12; Hemoglobin 14.6; Platelets 212.0; Potassium 4.8; Sodium 138; TSH 3.01    Lipid Panel    Component Value Date/Time   CHOL 127 01/09/2023 0814   TRIG 155.0 (H) 01/09/2023 0814   HDL 40.80 01/09/2023 0814   CHOLHDL 3 01/09/2023 0814   VLDL 31.0 01/09/2023 0814   LDLCALC 56 01/09/2023 0814   LDLDIRECT 76.5 09/02/2010 1036      Wt Readings from Last 3 Encounters:  06/30/23 195 lb 2 oz (88.5 kg)  06/05/23 192 lb (87.1 kg)  01/16/23 192 lb 8 oz (87.3 kg)        ASSESSMENT AND PLAN:  1.  Coronary artery disease involving native coronary arteries without angina: The patient overall is doing well from a cardiac standpoint. Given that he has a stent within a stent and also has a previous Taxus drug-eluting stent, I am planning to keep him on dual antiplatelet therapy indefinitely as tolerated.  I refilled clopidogrel today.  2. Hyperlipidemia: Continue atorvastatin 40 mg once daily.  I reviewed his most recent lipid profile done in June which showed an LDL of 56.  3. Essential hypertension: His blood pressure improved since he was switched from metoprolol to carvedilol.  This was refilled today.    Disposition:   FU with me in 1 year  Signed,  Lorine Bears, MD  06/30/2023 4:02 PM    [facility]

## 2023-07-17 DIAGNOSIS — H2513 Age-related nuclear cataract, bilateral: Secondary | ICD-10-CM | POA: Diagnosis not present

## 2023-07-17 DIAGNOSIS — H5203 Hypermetropia, bilateral: Secondary | ICD-10-CM | POA: Diagnosis not present

## 2023-08-14 ENCOUNTER — Other Ambulatory Visit: Payer: Self-pay | Admitting: Family Medicine

## 2023-08-14 MED ORDER — PANTOPRAZOLE SODIUM 40 MG PO TBEC: 40.0000 mg | DELAYED_RELEASE_TABLET | Freq: Every day | ORAL | 1 refills | Status: DC

## 2023-08-14 NOTE — Telephone Encounter (Signed)
Copied from CRM [id number]. Topic: Clinical - Medication Refill >> Aug 14, 2023  2:24 PM Steele Sizer wrote: Most Recent Primary Care Visit:  Provider: Sue Lush  Department: [facility]  Visit Type: MEDICARE AWV, SEQUENTIAL  Date: 06/05/2023  Medication: pantoprazole (PROTONIX) 40 MG tablet  Has the patient contacted their pharmacy? Yes (Agent: If no, request that the patient contact the pharmacy for the refill. If patient does not wish to contact the pharmacy document the reason why and proceed with request.) (Agent: If yes, when and what did the pharmacy advise?)  Is this the correct pharmacy for this prescription? Yes If no, delete pharmacy and type the correct one.  This is the patient's preferred pharmacy:  [id number] [place], [place] - [address] [place] [address] Phone: [phone] Fax: [phone]   Has the prescription been filled recently? Yes  Is the patient out of the medication? No  Has the patient been seen for an appointment in the last year OR does the patient have an upcoming appointment?   Can we respond through MyChart?   Agent: Please be advised that Rx refills may take up to 3 business days. We ask that you follow-up with your pharmacy.

## 2023-10-08 ENCOUNTER — Other Ambulatory Visit: Payer: Self-pay | Admitting: Cardiovascular Disease

## 2023-10-11 DIAGNOSIS — Z4589 Encounter for adjustment and management of other implanted devices: Secondary | ICD-10-CM | POA: Diagnosis not present

## 2023-10-11 DIAGNOSIS — H903 Sensorineural hearing loss, bilateral: Secondary | ICD-10-CM | POA: Diagnosis not present

## 2023-10-11 DIAGNOSIS — H9201 Otalgia, right ear: Secondary | ICD-10-CM | POA: Diagnosis not present

## 2023-12-02 ENCOUNTER — Encounter (HOSPITAL_COMMUNITY): Payer: Self-pay

## 2023-12-02 ENCOUNTER — Other Ambulatory Visit: Payer: Self-pay

## 2023-12-02 ENCOUNTER — Ambulatory Visit (HOSPITAL_COMMUNITY)
Admission: EM | Admit: 2023-12-02 | Discharge: 2023-12-02 | Disposition: A | Discharge status: Home / Self Care | Attending: Nurse Practitioner | Admitting: Nurse Practitioner

## 2023-12-02 DIAGNOSIS — T24422A Corrosion of unspecified degree of left knee, initial encounter: Secondary | ICD-10-CM | POA: Diagnosis not present

## 2023-12-02 DIAGNOSIS — X58XXXA Exposure to other specified factors, initial encounter: Secondary | ICD-10-CM | POA: Insufficient documentation

## 2023-12-02 DIAGNOSIS — T24421A Corrosion of unspecified degree of right knee, initial encounter: Secondary | ICD-10-CM | POA: Diagnosis not present

## 2023-12-02 DIAGNOSIS — Z23 Encounter for immunization: Secondary | ICD-10-CM

## 2023-12-02 MED ORDER — SULFAMETHOXAZOLE-TRIMETHOPRIM 800-160 MG PO TABS: 1.0000 | ORAL_TABLET | Freq: Two times a day (BID) | ORAL | 0 refills | Status: AC

## 2023-12-02 MED ORDER — TETANUS-DIPHTH-ACELL PERTUSSIS 5-2.5-18.5 LF-MCG/0.5 IM SUSY
PREFILLED_SYRINGE | INTRAMUSCULAR | Status: AC
  Filled 2023-12-02: qty 0.5

## 2023-12-02 MED ORDER — MUPIROCIN 2 % EX OINT: TOPICAL_OINTMENT | Freq: Two times a day (BID) | CUTANEOUS | 0 refills | Status: DC

## 2023-12-02 MED ORDER — TETANUS-DIPHTH-ACELL PERTUSSIS 5-2.5-18.5 LF-MCG/0.5 IM SUSY
0.5000 mL | PREFILLED_SYRINGE | Freq: Once | INTRAMUSCULAR | Status: AC
  Administered 2023-12-02: 0.5 mL via INTRAMUSCULAR

## 2023-12-02 NOTE — ED Provider Notes (Signed)
 [facility]    CSN: [id number] Arrival date & time: 12/02/23  1114      History   Chief Complaint Chief Complaint  Patient presents with   Knee Pain   Hand Injury    HPI Brian KUMMER Sr. is a 78 y.o. male.   Brian EVELYN Sr. is a 78 yo male that presents with a skin reaction on both knees that developed after pouring concrete on Wednesday afternoon. The patient reports that their knees became red that evening, and after applying Triple Antibiotic ointment, small watery, blistering bumps appeared a few hours later. The patient has experienced a similar reaction to this ointment in the past leading them to believe that he may be allergic to the ointment.   In an attempt to alleviate the symptoms, the patient used a vinegar and water mixture on the affected areas after reading online about the high pH of concrete. They report that this mixture felt good but burned slightly, and they have been applying it several times. The bumps have started oozing this morning, but the pain level is low, described as a "one" out of ten. The condition has not significantly impacted the patient's daily activities; they were able to attend their great-grandson's soccer game and play golf.  The patient denies any numbness or tingling in the affected areas. They confirm that the concrete they were working with was wet, delivered by truck. The patient is unsure about the timing of their last tetanus shot, estimating it was within the last 10 years but uncertain if it has been less or more than 5 years. They typically receive vaccines from a pharmacy rather than their primary care provider.  The following portions of the patient's history were reviewed and updated as appropriate: allergies, current medications, past family history, past medical history, past social history, past surgical history, and problem list.         Past Medical History:  Diagnosis Date   Colitis    Coronary artery  disease    DJD (degenerative joint disease)    Gallbladder disease    GERD (gastroesophageal reflux disease)    Hearing loss    Heart murmur    Hyperlipidemia    Hypertension    IBS (irritable bowel syndrome)    Increased prostate specific antigen (PSA) velocity     Patient Active Problem List   Diagnosis Date Noted   Current use of proton pump inhibitor 01/08/2023   Candida, oral 03/08/2022   Tinnitus of right ear 10/08/2021   Hearing loss sensory, bilateral 10/07/2021   Seasonal allergic rhinitis due to pollen 10/07/2021   Sensorineural hearing loss (SNHL) of both ears 10/31/2019   Aortic atherosclerosis (HCC) 09/30/2018   ETD (eustachian tube dysfunction) 10/05/2016   Routine general medical examination at a health care facility 11/17/2015   Coronary artery disease involving native coronary artery without angina pectoris 04/09/2015   History of heart artery stent 01/01/2015   History of colitis 11/08/2014   Osteoarthritis 07/30/2009   Elevated PSA 07/30/2009   Hearing loss 11/27/2007   IRRITABLE BOWEL SYNDROME 11/27/2007   Hyperlipidemia 08/13/2007   Essential hypertension 08/13/2007   GERD 08/13/2007    Past Surgical History:  Procedure Laterality Date   CARDIAC CATHETERIZATION     CARDIAC CATHETERIZATION N/A 01/07/2015   Procedure: Left Heart Cath and Coronary Angiography;  Surgeon: Wenona Hamilton, MD;  Location: [facility];  Service: Cardiovascular;  Laterality: N/A;   CARDIAC CATHETERIZATION N/A 01/07/2015  Procedure: Coronary Stent Intervention;  Surgeon: Wenona Hamilton, MD;  Location: [facility];  Service: Cardiovascular;  Laterality: N/A;   COLONOSCOPY     CORONARY STENT PLACEMENT     Dr. Grandville Lax   CORONARY STENT PLACEMENT  01/07/2015   LAD   TONSILLECTOMY AND ADENOIDECTOMY     in the 2nd grade       Home Medications    Prior to Admission medications   Medication Sig Start Date End Date Taking? Authorizing Provider  mupirocin  ointment (BACTROBAN) 2 % Apply topically 2 (two) times daily. Apply thin layer to wounds of both knees twice daily until healing is complete for up to 2 weeks 12/02/23  Yes Maryruth Sol, FNP  sulfamethoxazole-trimethoprim (BACTRIM DS) 800-160 MG tablet Take 1 tablet by mouth 2 (two) times daily for 7 days. 12/02/23 12/09/23 Yes Maryruth Sol, FNP  aspirin  EC 81 MG tablet Take 1 tablet (81 mg total) by mouth daily. 01/01/15   Aron, Talia M, MD  atorvastatin  (LIPITOR ) 40 MG tablet Take 1 tablet (40 mg total) by mouth daily. 01/16/23   Tower, Manley Seeds, MD  carvedilol  (COREG ) 6.25 MG tablet Take 1 tablet (6.25 mg total) by mouth 2 (two) times daily. 06/30/23   Wenona Hamilton, MD  clopidogrel  (PLAVIX ) 75 MG tablet Take 1 tablet (75 mg total) by mouth daily. 06/30/23   Wenona Hamilton, MD  Multiple Vitamins-Minerals (MULTIVITAMIN & MINERAL PO) Take 1 tablet by mouth daily.    [provider]  nitroGLYCERIN  (NITROSTAT ) 0.4 MG SL tablet PLACE 1 TABLET UNDER THE TONGUE EVERY 5 MINUTES AS NEEDED FOR CHEST PAIN. 10/09/23   Wenona Hamilton, MD  pantoprazole  (PROTONIX ) 40 MG tablet Take 1 tablet (40 mg total) by mouth daily. 08/14/23   Tower, Manley Seeds, MD  polyethylene glycol (MIRALAX ) packet Take 17 g by mouth daily. 09/27/18   Darolyn Elks, MD  ramipril  (ALTACE ) 10 MG capsule Take 1 capsule (10 mg total) by mouth daily. 01/16/23   Tower, Manley Seeds, MD    Family History Family History  Problem Relation Age of Onset   Stroke Mother    Colon cancer Neg Hx    Esophageal cancer Neg Hx    Rectal cancer Neg Hx    Stomach cancer Neg Hx    Prostate cancer Neg Hx    Bladder Cancer Neg Hx    Kidney cancer Neg Hx     Social History Social History   Tobacco Use   Smoking status: Never   Smokeless tobacco: Never  Vaping Use   Vaping status: Never Used  Substance Use Topics   Alcohol use: Yes    Alcohol/week: 0.0 standard drinks of alcohol    Comment: rarely drinks beer/approx. 1 case per year    Drug use: No     Allergies   Ciprofloxacin , Doxycycline, and Flagyl  [metronidazole ]   Review of Systems Review of Systems  Constitutional:  Negative for fever.  Musculoskeletal:  Negative for gait problem.  Skin:  Positive for wound.  Neurological:  Negative for weakness and numbness.  All other systems reviewed and are negative.    Physical Exam Triage Vital Signs ED Triage Vitals  Encounter Vitals Group     BP 12/02/23 1140 (!) 176/74     Systolic BP Percentile --      Diastolic BP Percentile --      Pulse Rate 12/02/23 1140 (!) 57     Resp 12/02/23 1140 20  Temp 12/02/23 1140 97.7 F (36.5 C)     Temp Source 12/02/23 1140 Oral     SpO2 12/02/23 1140 97 %     Weight --      Height --      Head Circumference --      Peak Flow --      Pain Score 12/02/23 1138 1     Pain Loc --      Pain Education --      Exclude from Growth Chart --    No data found.  Updated Vital Signs BP (!) 176/74 (BP Location: Right Arm)   Pulse (!) 57   Temp 97.7 F (36.5 C) (Oral)   Resp 20   SpO2 97%   Visual Acuity Right Eye Distance:   Left Eye Distance:   Bilateral Distance:    Right Eye Near:   Left Eye Near:    Bilateral Near:     Physical Exam Vitals reviewed.  Constitutional:      General: He is not in acute distress.    Appearance: Normal appearance. He is not toxic-appearing.  HENT:     Head: Normocephalic.     Mouth/Throat:     Mouth: Mucous membranes are moist.  Eyes:     Conjunctiva/sclera: Conjunctivae normal.  Cardiovascular:     Rate and Rhythm: Normal rate and regular rhythm.     Heart sounds: Normal heart sounds.  Pulmonary:     Effort: Pulmonary effort is normal.     Breath sounds: Normal breath sounds.  Musculoskeletal:        General: Normal range of motion.     Right knee: Swelling and erythema present. No deformity, effusion, ecchymosis or lacerations. Normal range of motion. Tenderness present.     Left knee: Swelling and erythema  present. No deformity, effusion, ecchymosis or lacerations. Normal range of motion. Tenderness present.  Skin:    General: Skin is warm and dry.     Findings: Wound present.     Comments: Bilateral knees (see images below)   Neurological:     General: No focal deficit present.     Mental Status: He is alert and oriented to person, place, and time.     Sensory: Sensation is intact.     Motor: Motor function is intact.     Coordination: Coordination is intact.     Gait: Gait is intact.       Right Knee    Left Knee   UC Treatments / Results  Labs (all labs ordered are listed, but only abnormal results are displayed) Labs Reviewed  AEROBIC/ANAEROBIC CULTURE W GRAM STAIN (SURGICAL/DEEP WOUND)    EKG   Radiology No results found.  Procedures Wound Care  Date/Time: 12/02/2023 1:06 PM  Performed by: Maryruth Sol, FNP Authorized by: Maryruth Sol, FNP   Consent:    Consent obtained:  Verbal   Consent given by:  Patient   Risks discussed:  Infection, pain and poor cosmetic result Universal protocol:    Patient identity confirmed:  Verbally with patient and arm band Sedation:    Sedation type:  None Anesthesia:    Anesthesia method:  None Procedure details:    Indications: skin infection     Wound location: bilateral knees. Skin layer closed with:    Wound care performed: see below. Dressing:    Dressing applied:  Telfa pad   Wrapped with:  Elastic bandage 4 inch Post-procedure details:    Procedure completion:  Tolerated well,  no immediate complications Comments:     Wounds were cleansed with sterile saline. Visible debris was easily removed. Area was patted dry. Thin layer of bacitracin ointment placed over the wounds and covered with a nonadherent dressing. The knees was then wrapped with an ACE wrap to secure the dressing in place.   (including critical care time)  Medications Ordered in UC Medications  Tdap (BOOSTRIX) injection 0.5 mL (0.5 mLs  Intramuscular Given 12/02/23 1325)    Initial Impression / Assessment and Plan / UC Course  I have reviewed the triage vital signs and the nursing notes.  Pertinent labs & imaging results that were available during my care of the patient were reviewed by me and considered in my medical decision making (see chart for details).    78 year old male presents with chemical burns to both knees following wet concrete exposure a few days ago. He developed redness the same night and subsequently noted blistering bumps after applying Triple Antibiotic ointment, which he has reacted to in the past, suggesting allergic contact dermatitis. He attempted self-treatment with a vinegar and water mixture, which caused a burning sensation. Lesions have begun to ooze, though pain is minimal at 1/10. He denies numbness or tingling and remains active, reporting no limitation in mobility. Wound culture was obtained to assess for possible bacterial infection, and a tetanus vaccine was administered. Triple Antibiotic ointment and vinegar mixture were discontinued due to irritation. Mupirocin ointment was prescribed twice daily for up to two weeks along with Bactrim DS for seven days. Patient was educated on proper wound care and advised to avoid further use of known irritants.  Today's evaluation has revealed no signs of a dangerous process. Discussed diagnosis with patient and/or guardian. Patient and/or guardian aware of their diagnosis, possible red flag symptoms to watch out for and need for close follow up. Patient and/or guardian understands verbal and written discharge instructions. Patient and/or guardian comfortable with plan and disposition.  Patient and/or guardian has a clear mental status at this time, good insight into illness (after discussion and teaching) and has clear judgment to make decisions regarding their care  Documentation was completed with the aid of voice recognition software. Transcription may  contain typographical errors. Final Clinical Impressions(s) / UC Diagnoses   Final diagnoses:  Chemical burn of left knee, unspecified corrosion degree, initial encounter  Chemical burn of right knee, unspecified corrosion degree, initial encounter     Discharge Instructions      You have been diagnosed with a corrosion burn to the knees caused by contact with concrete. This type of injury can damage the outer layer of the skin and sometimes deeper layers, depending on how long the skin was exposed and how abrasive the contact was. These wounds can be painful and are at risk for infection, so it's important to follow the treatment plan closely.  A wound culture was taken today, and the results are pending. You will be contacted only if something abnormal is found or if your treatment needs to be changed. Otherwise, you can view the results on MyChart. You also received a Tdap booster during your visit today.  You have been prescribed an oral antibiotic to help treat or prevent infection from bacteria that may have entered the wound. You should take this medication exactly as prescribed and finish the full course, even if the wounds start to look better before you're done.  You should also apply mupirocin ointment to the affected areas. Clean the wounds gently with  clean water, pat dry, then apply a thin layer of mupirocin. Cover the area with a non-stick dressing to protect it and promote healing. This should be done twice a day.  Always wash your hands before and after caring for the wound.  Monitor the area closely for signs of infection, including increased redness, swelling, warmth, pus, worsening pain, or fever. If you notice any of these signs, or if the wounds are not improving after several days, contact your healthcare provider. Follow-up is also needed if the wounds become larger, deeper, or start to develop thick scabbing or discoloration.  Try to keep the area clean and dry, avoid  pressure or friction on the wounds, and wear loose clothing or bandages to protect the area. If you have any questions or concerns during the healing process, or if you're unsure how to care for the wounds at home, please reach out to your primary care provider.     ED Prescriptions     Medication Sig Dispense Auth. Provider   sulfamethoxazole-trimethoprim (BACTRIM DS) 800-160 MG tablet Take 1 tablet by mouth 2 (two) times daily for 7 days. 14 tablet Kadience Macchi, FNP   mupirocin ointment (BACTROBAN) 2 % Apply topically 2 (two) times daily. Apply thin layer to wounds of both knees twice daily until healing is complete for up to 2 weeks 30 g Maryruth Sol, FNP      PDMP not reviewed this encounter.   Maryruth Sol, [place] 12/02/23 1328

## 2023-12-02 NOTE — Discharge Instructions (Addendum)
 You have been diagnosed with a corrosion burn to the knees caused by contact with concrete. This type of injury can damage the outer layer of the skin and sometimes deeper layers, depending on how long the skin was exposed and how abrasive the contact was. These wounds can be painful and are at risk for infection, so it's important to follow the treatment plan closely.  A wound culture was taken today, and the results are pending. You will be contacted only if something abnormal is found or if your treatment needs to be changed. Otherwise, you can view the results on MyChart. You also received a Tdap booster during your visit today.  You have been prescribed an oral antibiotic to help treat or prevent infection from bacteria that may have entered the wound. You should take this medication exactly as prescribed and finish the full course, even if the wounds start to look better before you're done.  You should also apply mupirocin ointment to the affected areas. Clean the wounds gently with clean water, pat dry, then apply a thin layer of mupirocin. Cover the area with a non-stick dressing to protect it and promote healing. This should be done twice a day.  Always wash your hands before and after caring for the wound.  Monitor the area closely for signs of infection, including increased redness, swelling, warmth, pus, worsening pain, or fever. If you notice any of these signs, or if the wounds are not improving after several days, contact your healthcare provider. Follow-up is also needed if the wounds become larger, deeper, or start to develop thick scabbing or discoloration.  Try to keep the area clean and dry, avoid pressure or friction on the wounds, and wear loose clothing or bandages to protect the area. If you have any questions or concerns during the healing process, or if you're unsure how to care for the wounds at home, please reach out to your primary care provider.

## 2023-12-02 NOTE — ED Triage Notes (Signed)
 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 Pt states he was wearing jeans but they were red afterwards and the next day there was like an abrasion/rash. Pt used abx ointment but he got little blisters after. Pt states they have yellow discharge. Pt states he has been using vinegar with water and misting it with a spray bottle.

## 2023-12-03 ENCOUNTER — Telehealth (HOSPITAL_COMMUNITY): Payer: Self-pay | Admitting: Emergency Medicine

## 2023-12-03 DIAGNOSIS — T300 Burn of unspecified body region, unspecified degree: Secondary | ICD-10-CM

## 2023-12-03 NOTE — Telephone Encounter (Signed)
 Lab called stating that they received a swab for aerobic and anaerobic swab yesterday that Ova Bloomer, NP ordered. The swab they received is only for aerobic so need new order reflect that test.

## 2023-12-04 ENCOUNTER — Ambulatory Visit: Admitting: Family

## 2023-12-05 LAB — AEROBIC CULTURE W GRAM STAIN (SUPERFICIAL SPECIMEN): Gram Stain: NONE SEEN

## 2023-12-12 ENCOUNTER — Ambulatory Visit (INDEPENDENT_AMBULATORY_CARE_PROVIDER_SITE_OTHER): Admitting: Family Medicine

## 2023-12-12 ENCOUNTER — Encounter: Payer: Self-pay | Admitting: Family Medicine

## 2023-12-12 VITALS — BP 133/80 | HR 57 | Temp 97.8°F | Ht 68.0 in | Wt 195.2 lb

## 2023-12-12 DIAGNOSIS — T24429D Corrosion of unspecified degree of unspecified knee, subsequent encounter: Secondary | ICD-10-CM | POA: Diagnosis not present

## 2023-12-12 DIAGNOSIS — T65891D Toxic effect of other specified substances, accidental (unintentional), subsequent encounter: Secondary | ICD-10-CM | POA: Diagnosis not present

## 2023-12-12 MED ORDER — MUPIROCIN 2 % EX OINT: TOPICAL_OINTMENT | Freq: Two times a day (BID) | CUTANEOUS | 0 refills | Status: DC

## 2023-12-12 NOTE — Assessment & Plan Note (Signed)
 Bilateral knees From exposure to concrete  Seen in UC Reviewed UC records, pictures and plan in detail Tetanus was updated there Took course of bactrim  , using mupirocin  (refilled this) Reassuring exam- redness mostly resolved/ some scab on left knee Instructed to keep clean with soap and water /avoid submerging  Continue ointment until clear Update if does not continue to improve in a week or if worsening  Call back and Er precautions noted in detail today

## 2023-12-12 NOTE — Patient Instructions (Addendum)
 Knees look better  Continue to clean with soap and water  Use the mupirocin  ointment as needed   If symptoms worsen -more redness or return of blisters, let me know   Update if not starting to improve in a week or if worsening   Get better about sunscreen/sunscreen clothing to prevent skin cancer
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
## 2023-12-30 ENCOUNTER — Other Ambulatory Visit: Payer: Self-pay | Admitting: Family Medicine

## 2024-01-14 ENCOUNTER — Telehealth: Payer: Self-pay | Admitting: Family Medicine

## 2024-01-14 DIAGNOSIS — E7800 Pure hypercholesterolemia, unspecified: Secondary | ICD-10-CM

## 2024-01-14 DIAGNOSIS — R972 Elevated prostate specific antigen [PSA]: Secondary | ICD-10-CM

## 2024-01-14 DIAGNOSIS — I10 Essential (primary) hypertension: Secondary | ICD-10-CM

## 2024-01-14 DIAGNOSIS — Z79899 Other long term (current) drug therapy: Secondary | ICD-10-CM

## 2024-01-14 NOTE — Telephone Encounter (Signed)
-----   Message from Harlene Du sent at 01/01/2024  9:13 AM EDT ----- Regarding: Lab Mon 01/15/24 Hello,  Patient is coming in for CPE labs on Monday 01/15/24. Can we get orders please.   Thanks

## 2024-01-15 ENCOUNTER — Ambulatory Visit: Payer: Self-pay | Admitting: Family Medicine

## 2024-01-15 ENCOUNTER — Other Ambulatory Visit (INDEPENDENT_AMBULATORY_CARE_PROVIDER_SITE_OTHER): Payer: Medicare HMO

## 2024-01-15 DIAGNOSIS — E7800 Pure hypercholesterolemia, unspecified: Secondary | ICD-10-CM | POA: Diagnosis not present

## 2024-01-15 DIAGNOSIS — R972 Elevated prostate specific antigen [PSA]: Secondary | ICD-10-CM

## 2024-01-15 DIAGNOSIS — Z79899 Other long term (current) drug therapy: Secondary | ICD-10-CM | POA: Diagnosis not present

## 2024-01-15 DIAGNOSIS — I10 Essential (primary) hypertension: Secondary | ICD-10-CM | POA: Diagnosis not present

## 2024-01-15 LAB — VITAMIN B12: Vitamin B-12: 334 pg/mL (ref 211–911)

## 2024-01-15 LAB — LIPID PANEL
Cholesterol: 142 mg/dL (ref 0–200)
HDL: 38.3 mg/dL — ABNORMAL LOW (ref >39.00)
LDL Cholesterol: 79 mg/dL (ref 0–99)
NonHDL: 103.2
Total CHOL/HDL Ratio: 4
Triglycerides: 121 mg/dL (ref 0.0–149.0)
VLDL: 24.2 mg/dL (ref 0.0–40.0)

## 2024-01-15 LAB — COMPREHENSIVE METABOLIC PANEL WITH GFR
ALT: 20 U/L (ref 0–53)
AST: 19 U/L (ref 0–37)
Albumin: 4.2 g/dL (ref 3.5–5.2)
Alkaline Phosphatase: 105 U/L (ref 39–117)
BUN: 27 mg/dL — ABNORMAL HIGH (ref 6–23)
CO2: 27 meq/L (ref 19–32)
Calcium: 9.4 mg/dL (ref 8.4–10.5)
Chloride: 104 meq/L (ref 96–112)
Creatinine, Ser: 1.02 mg/dL (ref 0.40–1.50)
GFR: 71.53 mL/min (ref >60.00)
Glucose, Bld: 94 mg/dL (ref 70–99)
Potassium: 4.8 meq/L (ref 3.5–5.1)
Sodium: 138 meq/L (ref 135–145)
Total Bilirubin: 1.1 mg/dL (ref 0.2–1.2)
Total Protein: 6.9 g/dL (ref 6.0–8.3)

## 2024-01-15 LAB — CBC WITH DIFFERENTIAL/PLATELET
Basophils Absolute: 0 10*3/uL (ref 0.0–0.1)
Basophils Relative: 0.6 % (ref 0.0–3.0)
Eosinophils Absolute: 0.2 10*3/uL (ref 0.0–0.7)
Eosinophils Relative: 3.4 % (ref 0.0–5.0)
HCT: 43.4 % (ref 39.0–52.0)
Hemoglobin: 14.4 g/dL (ref 13.0–17.0)
Lymphocytes Relative: 34.4 % (ref 12.0–46.0)
Lymphs Abs: 2.4 10*3/uL (ref 0.7–4.0)
MCHC: 33.2 g/dL (ref 30.0–36.0)
MCV: 92.7 fl (ref 78.0–100.0)
Monocytes Absolute: 0.8 10*3/uL (ref 0.1–1.0)
Monocytes Relative: 10.9 % (ref 3.0–12.0)
Neutro Abs: 3.5 10*3/uL (ref 1.4–7.7)
Neutrophils Relative %: 50.7 % (ref 43.0–77.0)
Platelets: 182 10*3/uL (ref 150.0–400.0)
RBC: 4.68 Mil/uL (ref 4.22–5.81)
RDW: 13.9 % (ref 11.5–15.5)
WBC: 6.9 10*3/uL (ref 4.0–10.5)

## 2024-01-15 LAB — TSH: TSH: 3.4 u[IU]/mL (ref 0.35–5.50)

## 2024-01-15 LAB — PSA, MEDICARE: PSA: 4.69 ng/mL — ABNORMAL HIGH (ref 0.10–4.00)

## 2024-01-20 ENCOUNTER — Other Ambulatory Visit: Payer: Self-pay | Admitting: Family Medicine

## 2024-01-22 ENCOUNTER — Ambulatory Visit (INDEPENDENT_AMBULATORY_CARE_PROVIDER_SITE_OTHER): Payer: Medicare HMO | Admitting: Family Medicine

## 2024-01-22 ENCOUNTER — Encounter: Payer: Self-pay | Admitting: Family Medicine

## 2024-01-22 VITALS — BP 119/65 | HR 47 | Temp 97.7°F | Ht 67.25 in | Wt 196.5 lb

## 2024-01-22 DIAGNOSIS — H9311 Tinnitus, right ear: Secondary | ICD-10-CM

## 2024-01-22 DIAGNOSIS — Z79899 Other long term (current) drug therapy: Secondary | ICD-10-CM | POA: Diagnosis not present

## 2024-01-22 DIAGNOSIS — E7800 Pure hypercholesterolemia, unspecified: Secondary | ICD-10-CM

## 2024-01-22 DIAGNOSIS — R972 Elevated prostate specific antigen [PSA]: Secondary | ICD-10-CM | POA: Diagnosis not present

## 2024-01-22 DIAGNOSIS — I10 Essential (primary) hypertension: Secondary | ICD-10-CM | POA: Diagnosis not present

## 2024-01-22 DIAGNOSIS — Z0000 Encounter for general adult medical examination without abnormal findings: Secondary | ICD-10-CM

## 2024-01-22 DIAGNOSIS — I2510 Atherosclerotic heart disease of native coronary artery without angina pectoris: Secondary | ICD-10-CM

## 2024-01-22 DIAGNOSIS — K219 Gastro-esophageal reflux disease without esophagitis: Secondary | ICD-10-CM

## 2024-01-22 MED ORDER — PANTOPRAZOLE SODIUM 40 MG PO TBEC: 40.0000 mg | DELAYED_RELEASE_TABLET | Freq: Every day | ORAL | 3 refills | Status: AC

## 2024-01-22 NOTE — Assessment & Plan Note (Signed)
 Reviewed health habits including diet and exercise and skin cancer prevention Reviewed appropriate screening tests for age  Also reviewed health mt list, fam hx and immunization status , as well as social and family history   See HPI Labs reviewed and ordered Health Maintenance  Topic Date Due   COVID-19 Vaccine (7 - Pfizer risk 2024-25 season) 02/06/2025*   Flu Shot  02/23/2024   Medicare Annual Wellness Visit  06/04/2024   DTaP/Tdap/Td vaccine (3 - Td or Tdap) 12/01/2033   Pneumococcal Vaccine for age over 79  Completed   Hepatitis C Screening  Completed   Zoster (Shingles) Vaccine  Completed   Hepatitis B Vaccine  Aged Out   HPV Vaccine  Aged Out   Meningitis B Vaccine  Aged Out  *Topic was postponed. The date shown is not the original due date.    PSA is stable No falls or fracture  Discussed fall prevention, supplements and exercise for bone density  Urged to use more sun protection when outdoors  PHQ 0

## 2024-01-22 NOTE — Assessment & Plan Note (Signed)
 Disc goals for lipids and reasons to control them Rev last labs with pt Rev low sat fat diet in detail   Continues atorvastatin  40 mg daily  In setting of CAD LDL is 79- goal under 70  , encouraged to watch diet   Urged more exercise for low HDL

## 2024-01-22 NOTE — Assessment & Plan Note (Signed)
 bp in fair control at this time  BP Readings from Last 1 Encounters:  01/22/24 119/65   No changes needed Most recent labs reviewed  Disc lifstyle change with low sodium diet and exercise    Coreg  6.25 mg bid  altace  10 mg daily   Pulse is low baseline - tolerates this Under cardiology care  Urged to watch for light headedness

## 2024-01-22 NOTE — Assessment & Plan Note (Signed)
 Continues to c/o right ear discomfort  Has appointment up coming with ear specialist-has ET

## 2024-01-22 NOTE — Assessment & Plan Note (Signed)
 Lab Results  Component Value Date   VITAMINB12 334 01/15/2024    Was able to decrease protonix  40 mg to once daily

## 2024-01-22 NOTE — Assessment & Plan Note (Addendum)
 Lab Results  Component Value Date   PSA1 3.6 05/10/2022   PSA1 4.3 (H) 02/03/2022   PSA 4.69 (H) 01/15/2024   PSA 3.61 01/09/2023   PSA 3.83 01/04/2022    Stable-back to baseline  Was being watched by urology (reviewed last note)   No urinary changes  Will continue to follow
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
## 2024-01-22 NOTE — Assessment & Plan Note (Signed)
 Has weaned down to daily from bid pantoprazole  40 mg  Has breakthrough symptoms if he stops it  Encouraged to watch diet

## 2024-01-22 NOTE — Patient Instructions (Addendum)
 Stay active Add some strength training to your routine, this is important for bone and brain health and can reduce your risk of falls and help your body use insulin properly and regulate weight  Light weights, exercise bands , and internet videos are a good way to start  Yoga (chair or regular), machines , floor exercises or a gym with machines are also good options   Silver sneakers program is an excellent idea    For cholesterol Avoid red meat/ fried foods/ egg yolks/ fatty breakfast meats/ butter, cheese and high fat dairy/ and shellfish    For weight /general health Try to get most of your carbohydrates from produce (with the exception of white potatoes) and whole grains Eat less bread/pasta/rice/snack foods/cereals/sweets and other items from the middle of the grocery store (processed carbs)   Be sure to use sunscreen on face/ears/ head as well as arms and legs

## 2024-01-22 NOTE — Assessment & Plan Note (Addendum)
 Continues cardiology care  Atorvastatin  for cholesterol Continues plavix  indefinitely  No angina

## 2024-02-15 ENCOUNTER — Other Ambulatory Visit: Payer: Self-pay | Admitting: Family Medicine

## 2024-02-15 DIAGNOSIS — H7321 Unspecified myringitis, right ear: Secondary | ICD-10-CM | POA: Diagnosis not present

## 2024-02-15 DIAGNOSIS — H6991 Unspecified Eustachian tube disorder, right ear: Secondary | ICD-10-CM | POA: Diagnosis not present

## 2024-02-15 DIAGNOSIS — H90A31 Mixed conductive and sensorineural hearing loss, unilateral, right ear with restricted hearing on the contralateral side: Secondary | ICD-10-CM | POA: Diagnosis not present

## 2024-02-15 DIAGNOSIS — H903 Sensorineural hearing loss, bilateral: Secondary | ICD-10-CM | POA: Diagnosis not present

## 2024-02-15 DIAGNOSIS — Z4589 Encounter for adjustment and management of other implanted devices: Secondary | ICD-10-CM | POA: Diagnosis not present

## 2024-02-16 NOTE — Telephone Encounter (Signed)
 Last filled on 12/12/23 #30 g/ 0 refill  Pt had a recent CPE and next CPE is already scheduled for 01/22/25

## 2024-04-29 ENCOUNTER — Encounter: Payer: Self-pay | Admitting: Family Medicine

## 2024-04-29 ENCOUNTER — Ambulatory Visit: Admitting: Family Medicine

## 2024-04-29 VITALS — BP 128/74 | HR 57 | Temp 97.8°F | Ht 67.25 in | Wt 194.4 lb

## 2024-04-29 DIAGNOSIS — G8929 Other chronic pain: Secondary | ICD-10-CM | POA: Diagnosis not present

## 2024-04-29 DIAGNOSIS — H9201 Otalgia, right ear: Secondary | ICD-10-CM

## 2024-04-29 NOTE — Patient Instructions (Addendum)
 Start back on a steroid nasal spray like flonase  2 sprays in each nostril daily  This is to help open up your sinuses/ears/nose   I don't see any evidence of bacterial infection right now  Watch for sinus pain /fever or worse ear pain   Call your ENT office (Dr Thaddeus) for an early follow up appointment   Keep us  posted

## 2024-04-29 NOTE — Assessment & Plan Note (Addendum)
 History of mutiple eust tubes in this ear -one is still in place Pt continues to have fullness/ hearing loss and occational pain in that ear   Reviewed ENT note/ results and plans from 01/2024 (Dr Thaddeus) Follow up planned for January but given symptoms I recommend earlier  He will call for appointment  Overall reassuring exam today (no infection)  Also having some mucous/pnd  Recommend he return to flonase  ns daily to help with allergies/may also help ETD   Disc symptomatic care - see instructions on AVS   Call back and Er precautions noted in detail today  Watch for increased pain/ fever/discharge from ear or severe headache
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
## 2024-05-02 NOTE — Progress Notes (Signed)
 Brian LITTIE Ada Sr.                                          MRN: [id number]   05/02/2024   The [facility] Quality Team Specialist reviewed this patient medical record for the purposes of chart review for care gap closure. The following were reviewed: abstraction for care gap closure-controlling blood pressure.    [facility]I Quality Team

## 2024-05-05 ENCOUNTER — Other Ambulatory Visit: Payer: Self-pay | Admitting: Family Medicine

## 2024-05-07 MED ORDER — FLUTICASONE PROPIONATE 50 MCG/ACT NA SUSP: NASAL | 1 refills | Status: AC

## 2024-05-07 NOTE — Addendum Note (Signed)
 Addended by: RAYLEEN GLENDORA HERO on: 05/07/2024 12:26 PM   Modules accepted: Orders

## 2024-05-09 DIAGNOSIS — H9201 Otalgia, right ear: Secondary | ICD-10-CM | POA: Diagnosis not present

## 2024-05-09 DIAGNOSIS — Z4589 Encounter for adjustment and management of other implanted devices: Secondary | ICD-10-CM | POA: Diagnosis not present

## 2024-05-21 DIAGNOSIS — H9201 Otalgia, right ear: Secondary | ICD-10-CM | POA: Diagnosis not present

## 2024-06-03 DIAGNOSIS — L720 Epidermal cyst: Secondary | ICD-10-CM | POA: Diagnosis not present

## 2024-06-03 DIAGNOSIS — D485 Neoplasm of uncertain behavior of skin: Secondary | ICD-10-CM | POA: Diagnosis not present

## 2024-06-05 ENCOUNTER — Ambulatory Visit (INDEPENDENT_AMBULATORY_CARE_PROVIDER_SITE_OTHER): Payer: Medicare HMO

## 2024-06-05 VITALS — Ht 67.25 in | Wt 194.0 lb

## 2024-06-05 DIAGNOSIS — Z0000 Encounter for general adult medical examination without abnormal findings: Secondary | ICD-10-CM

## 2024-06-05 NOTE — Patient Instructions (Signed)
 Brian Todd,  Thank you for taking the time for your Medicare Wellness Visit. I appreciate your continued commitment to your health goals. Please review the care plan we discussed, and feel free to reach out if I can assist you further.  Please note that Annual Wellness Visits do not include a physical exam. Some assessments may be limited, especially if the visit was conducted virtually. If needed, we may recommend an in-person follow-up with your provider.  Ongoing Care Seeing your primary care provider every 3 to 6 months helps us  monitor your health and provide consistent, personalized care.   Referrals If a referral was made during today's visit and you haven't received any updates within two weeks, please contact the referred provider directly to check on the status.  Recommended Screenings:  Health Maintenance  Topic Date Due   Medicare Annual Wellness Visit  06/04/2024   COVID-19 Vaccine (7 - Pfizer risk 2025-26 season) 11/12/2024   DTaP/Tdap/Td vaccine (3 - Td or Tdap) 12/01/2033   Pneumococcal Vaccine for age over 72  Completed   Flu Shot  Completed   Hepatitis C Screening  Completed   Zoster (Shingles) Vaccine  Completed   Meningitis B Vaccine  Aged Out       06/05/2024    2:14 PM  Advanced Directives  Does Patient Have a Medical Advance Directive? No    Vision: Annual vision screenings are recommended for early detection of glaucoma, cataracts, and diabetic retinopathy. These exams can also reveal signs of chronic conditions such as diabetes and high blood pressure.  Dental: Annual dental screenings help detect early signs of oral cancer, gum disease, and other conditions linked to overall health, including heart disease and diabetes.

## 2024-06-05 NOTE — Progress Notes (Signed)
 Chief Complaint  Patient presents with   Medicare Wellness     Subjective:   Brian Todd. is a 78 y.o. male who presents for a Medicare Annual Wellness Visit.  Allergies (verified) Ciprofloxacin , Doxycycline, and Flagyl  [metronidazole ]   History: Past Medical History:  Diagnosis Date   Allergy See Chart   Colitis    Coronary artery disease    DJD (degenerative joint disease)    Gallbladder disease    GERD (gastroesophageal reflux disease)    Hearing loss    Heart murmur    Hyperlipidemia    Hypertension    IBS (irritable bowel syndrome)    Increased prostate specific antigen (PSA) velocity    Past Surgical History:  Procedure Laterality Date   CARDIAC CATHETERIZATION     CARDIAC CATHETERIZATION N/A 01/07/2015   Procedure: Left Heart Cath and Coronary Angiography;  Surgeon: Deatrice DELENA Cage, MD;  Location: [facility];  Service: Cardiovascular;  Laterality: N/A;   CARDIAC CATHETERIZATION N/A 01/07/2015   Procedure: Coronary Stent Intervention;  Surgeon: Deatrice DELENA Cage, MD;  Location: [facility];  Service: Cardiovascular;  Laterality: N/A;   COLONOSCOPY     CORONARY STENT PLACEMENT     Dr. Obie   CORONARY STENT PLACEMENT  01/07/2015   LAD   TONSILLECTOMY AND ADENOIDECTOMY     in the 2nd grade   Family History  Problem Relation Age of Onset   Stroke Mother    Colon cancer Neg Hx    Esophageal cancer Neg Hx    Rectal cancer Neg Hx    Stomach cancer Neg Hx    Prostate cancer Neg Hx    Bladder Cancer Neg Hx    Kidney cancer Neg Hx    Social History   Occupational History   Occupation: public relations account executive    Comment: Retired  Tobacco Use   Smoking status: Never   Smokeless tobacco: Never  Vaping Use   Vaping status: Never Used  Substance and Sexual Activity   Alcohol use: Yes    Alcohol/week: 0.0 standard drinks of alcohol    Comment: rarely drinks beer/approx. 1 case per year   Drug use: No   Sexual activity: Yes   Tobacco  Counseling Counseling given: Not Answered  SDOH Screenings   Food Insecurity: No Food Insecurity (06/05/2024)  Housing: Low Risk  (06/05/2024)  Transportation Needs: No Transportation Needs (06/05/2024)  Utilities: Not At Risk (06/05/2024)  Alcohol Screen: Low Risk  (06/05/2024)  Depression (PHQ2-9): Low Risk  (06/05/2024)  Financial Resource Strain: Low Risk  (06/05/2024)  Physical Activity: Insufficiently Active (06/05/2024)  Social Connections: Moderately Integrated (06/05/2024)  Stress: No Stress Concern Present (06/05/2024)  Tobacco Use: Low Risk  (06/05/2024)  Health Literacy: Adequate Health Literacy (06/05/2024)   See flowsheets for full screening details  Depression Screen PHQ 2 & 9 Depression Scale- Over the past 2 weeks, how often have you been bothered by any of the following problems? Little interest or pleasure in doing things: 0 Feeling down, depressed, or hopeless (PHQ Adolescent also includes...irritable): 0 PHQ-2 Total Score: 0 Trouble falling or staying asleep, or sleeping too much: 0 Feeling tired or having little energy: 0 Poor appetite or overeating (PHQ Adolescent also includes...weight loss): 0 Feeling bad about yourself - or that you are a failure or have let yourself or your family down: 0 Trouble concentrating on things, such as reading the newspaper or watching television (PHQ Adolescent also includes...like school work): 0 Moving or speaking so  slowly that other people could have noticed. Or the opposite - being so fidgety or restless that you have been moving around a lot more than usual: 0 Thoughts that you would be better off dead, or of hurting yourself in some way: 0 PHQ-9 Total Score: 0 If you checked off any problems, how difficult have these problems made it for you to do your work, take care of things at home, or get along with other people?: Not difficult at all     Goals Addressed             This Visit's Progress    COMPLETED: DIET -  EAT MORE FRUITS AND VEGETABLES       Patient Stated       Increasing exercise as we just signed up for [facility]: goal 3 days       Visit info / Clinical Intake: Medicare Wellness Visit Type:: Subsequent Annual Wellness Visit Persons participating in visit:: patient Medicare Wellness Visit Mode:: Telephone If telephone:: video declined Because this visit was a virtual/telehealth visit:: unable to obtan vitals due to lack of equipment If Telephone or Video please confirm:: I discussed the limitations of evaluation and management by telemedicine Patient Location:: [place] Provider Location:: clinic Information given by:: patient Interpreter Needed?: No Pre-visit prep was completed: yes AWV questionnaire completed by patient prior to visit?: no Living arrangements:: lives with spouse/significant other Patient's Overall Health Status Rating: very good Typical amount of pain: none Does pain affect daily life?: no Are you currently prescribed opioids?: no  Dietary Habits and Nutritional Risks How many meals a day?: 2 Eats fruit and vegetables daily?: yes Most meals are obtained by: preparing own meals; eating out (eat out some) In the last 2 weeks, have you had any of the following?: none Diabetic:: no  Functional Status Activities of Daily Living (to include ambulation/medication): Independent Ambulation: Independent Medication Administration: Independent Home Management: Independent Manage your own finances?: yes Primary transportation is: driving Concerns about vision?: no *vision screening is required for WTM* Concerns about hearing?: no  Fall Screening Falls in the past year?: 0 Number of falls in past year: 0 Was there an injury with Fall?: 0 Fall Risk Category Calculator: 0 Patient Fall Risk Level: Low Fall Risk  Fall Risk Patient at Risk for Falls Due to: No Fall Risks Fall risk Follow up: Education provided; Falls prevention  discussed  Home and Transportation Safety: All rugs have non-skid backing?: N/A, no rugs All stairs or steps have railings?: N/A, no stairs Grab bars in the bathtub or shower?: (!) no Have non-skid surface in bathtub or shower?: (!) no Good home lighting?: yes Regular seat belt use?: yes Hospital stays in the last year:: no  Cognitive Assessment Difficulty concentrating, remembering, or making decisions? : no Will 6CIT or Mini Cog be Completed: yes What year is it?: 0 points What month is it?: 0 points Give patient an address phrase to remember (5 components): [address] [place]  About what time is it?: 0 points Count backwards from 20 to 1: 0 points Say the months of the year in reverse: 0 points Repeat the address phrase from earlier: 0 points 6 CIT Score: 0 points  Advance Directives (For Healthcare) Does Patient Have a Medical Advance Directive?: No  Reviewed/Updated  Reviewed/Updated: Reviewed All (Medical, Surgical, Family, Medications, Allergies, Care Teams, Patient Goals)      Objective:    Today's Vitals   06/05/24 1410  Weight: 194  lb (88 kg)  Height: 5' 7.25 (1.708 m)   Body mass index is 30.16 kg/m.  Current Medications (verified) Outpatient Encounter Medications as of 06/05/2024  Medication Sig   aspirin  EC 81 MG tablet Take 1 tablet (81 mg total) by mouth daily.   atorvastatin  (LIPITOR ) 40 MG tablet TAKE 1 TABLET BY MOUTH EVERY DAY   carvedilol  (COREG ) 6.25 MG tablet Take 1 tablet (6.25 mg total) by mouth 2 (two) times daily.   clopidogrel  (PLAVIX ) 75 MG tablet Take 1 tablet (75 mg total) by mouth daily.   fluticasone  (FLONASE ) 50 MCG/ACT nasal spray SPRAY 2 SPRAYS INTO EACH NOSTRIL EVERY DAY   Multiple Vitamins-Minerals (MULTIVITAMIN & MINERAL PO) Take 1 tablet by mouth daily.   nitroGLYCERIN  (NITROSTAT ) 0.4 MG SL tablet PLACE 1 TABLET UNDER THE TONGUE EVERY 5 MINUTES AS NEEDED FOR CHEST PAIN.   pantoprazole  (PROTONIX ) 40 MG tablet Take 1  tablet (40 mg total) by mouth daily.   polyethylene glycol (MIRALAX ) packet Take 17 g by mouth daily.   ramipril  (ALTACE ) 10 MG capsule TAKE 1 CAPSULE BY MOUTH EVERY DAY   No facility-administered encounter medications on file as of 06/05/2024.   Hearing/Vision screen Vision Screening - Comments:: UTD w/visits to Dr JINNY Reusing Immunizations and Health Maintenance Health Maintenance  Topic Date Due   Medicare Annual Wellness (AWV)  06/04/2024   COVID-19 Vaccine (7 - Pfizer risk 2025-26 season) 11/12/2024   DTaP/Tdap/Td (3 - Td or Tdap) 12/01/2033   Pneumococcal Vaccine: 50+ Years  Completed   Influenza Vaccine  Completed   Hepatitis C Screening  Completed   Zoster Vaccines- Shingrix  Completed   Meningococcal B Vaccine  Aged Out       Assessment/Plan:  This is a routine wellness examination for Hanish.  Patient Care Team: Tower, Laine LABOR, MD as PCP - General (Family Medicine) Darron Deatrice LABOR, MD as PCP - Cardiology (Cardiology) Geraldene Loving, MD as Consulting Physician (Ophthalmology) Darron Deatrice LABOR, MD as Consulting Physician (Cardiology)  I have personally reviewed and noted the following in the patient's chart:   Medical and social history Use of alcohol, tobacco or illicit drugs  Current medications and supplements including opioid prescriptions. Functional ability and status Nutritional status Physical activity Advanced directives List of other physicians Hospitalizations, surgeries, and ER visits in previous 12 months Vitals Screenings to include cognitive, depression, and falls Referrals and appointments  No orders of the defined types were placed in this encounter.  In addition, I have reviewed and discussed with patient certain preventive protocols, quality metrics, and best practice recommendations. A written personalized care plan for preventive services as well as general preventive health recommendations were provided to patient.   Erminio LITTIE Saris, LPN   [id number]     After Visit Summary: (MyChart) Due to this being a telephonic visit, the after visit summary with patients personalized plan was offered to patient via MyChart   Nurse Notes: Pt has no concerns or questions. AWV NOT made for one year  pt will align with PCE in 2027 (defers coming in office next year for 2026)

## 2024-06-29 ENCOUNTER — Other Ambulatory Visit: Payer: Self-pay | Admitting: Cardiovascular Disease

## 2024-07-07 ENCOUNTER — Other Ambulatory Visit: Payer: Self-pay | Admitting: Cardiovascular Disease

## 2024-08-01 ENCOUNTER — Other Ambulatory Visit: Payer: Self-pay | Admitting: Cardiovascular Disease

## 2024-08-11 ENCOUNTER — Other Ambulatory Visit: Payer: Self-pay | Admitting: Cardiovascular Disease

## 2024-08-28 ENCOUNTER — Encounter: Payer: Self-pay | Admitting: *Deleted

## 2024-08-28 NOTE — Progress Notes (Signed)
 Brian Todd.                                          MRN: [id number]   08/28/2024   The [facility] Quality Team Specialist reviewed this patient medical record for the purposes of chart review for care gap closure. The following were reviewed: chart review for care gap closure-controlling blood pressure.    [facility]BCI Quality Team

## 2024-09-18 ENCOUNTER — Ambulatory Visit: Admitting: Cardiovascular Disease

## 2025-01-15 ENCOUNTER — Other Ambulatory Visit

## 2025-01-22 ENCOUNTER — Encounter: Admitting: Family Medicine
